# Patient Record
Sex: Female | Born: 1950 | Race: White | Hispanic: No | Marital: Married | Smoking: Former smoker
Health system: Southern US, Community
[De-identification: ages and names within clinical notes are randomized; demographics above are authoritative.]

## PROBLEM LIST (undated history)

## (undated) DIAGNOSIS — R740 Nonspecific elevation of levels of transaminase and lactic acid dehydrogenase [LDH]: Secondary | ICD-10-CM

## (undated) DIAGNOSIS — Z79899 Other long term (current) drug therapy: Secondary | ICD-10-CM

## (undated) DIAGNOSIS — I6523 Occlusion and stenosis of bilateral carotid arteries: Secondary | ICD-10-CM

## (undated) DIAGNOSIS — M069 Rheumatoid arthritis, unspecified: Secondary | ICD-10-CM

## (undated) DIAGNOSIS — E272 Addisonian crisis: Secondary | ICD-10-CM

## (undated) DIAGNOSIS — N183 Chronic kidney disease, stage 3 (moderate): Secondary | ICD-10-CM

## (undated) DIAGNOSIS — R944 Abnormal results of kidney function studies: Secondary | ICD-10-CM

## (undated) DIAGNOSIS — Z9289 Personal history of other medical treatment: Secondary | ICD-10-CM

## (undated) DIAGNOSIS — T7840XA Allergy, unspecified, initial encounter: Secondary | ICD-10-CM

## (undated) DIAGNOSIS — M1990 Unspecified osteoarthritis, unspecified site: Secondary | ICD-10-CM

## (undated) DIAGNOSIS — N179 Acute kidney failure, unspecified: Secondary | ICD-10-CM

## (undated) DIAGNOSIS — E039 Hypothyroidism, unspecified: Secondary | ICD-10-CM

## (undated) DIAGNOSIS — I10 Essential (primary) hypertension: Secondary | ICD-10-CM

## (undated) DIAGNOSIS — H269 Unspecified cataract: Secondary | ICD-10-CM

## (undated) DIAGNOSIS — D649 Anemia, unspecified: Secondary | ICD-10-CM

## (undated) DIAGNOSIS — K8020 Calculus of gallbladder without cholecystitis without obstruction: Secondary | ICD-10-CM

## (undated) HISTORY — DX: Addisonian crisis: E27.2

## (undated) HISTORY — DX: Occlusion and stenosis of bilateral carotid arteries: I65.23

## (undated) HISTORY — DX: Hypothyroidism, unspecified: E03.9

## (undated) HISTORY — DX: Unspecified cataract: H26.9

## (undated) HISTORY — DX: Rheumatoid arthritis, unspecified: M06.9

## (undated) HISTORY — PX: APPENDECTOMY: SHX54

## (undated) HISTORY — DX: Nonspecific elevation of levels of transaminase and lactic acid dehydrogenase (ldh): R74.0

## (undated) HISTORY — DX: Acute kidney failure, unspecified: N17.9

## (undated) HISTORY — DX: Unspecified osteoarthritis, unspecified site: M19.90

## (undated) HISTORY — PX: JOINT REPLACEMENT: SHX530

## (undated) HISTORY — DX: Personal history of other medical treatment: Z92.89

## (undated) HISTORY — DX: Chronic kidney disease, stage 3 (moderate): N18.3

## (undated) HISTORY — DX: Abnormal results of kidney function studies: R94.4

## (undated) HISTORY — DX: Calculus of gallbladder without cholecystitis without obstruction: K80.20

## (undated) HISTORY — DX: Other long term (current) drug therapy: Z79.899

## (undated) HISTORY — DX: Allergy, unspecified, initial encounter: T78.40XA

## (undated) HISTORY — DX: Essential (primary) hypertension: I10

## (undated) HISTORY — DX: Anemia, unspecified: D64.9

---

## 2013-10-04 HISTORY — PX: CATARACT EXTRACTION, BILATERAL: SHX1313

## 2015-04-25 LAB — HM MAMMOGRAPHY

## 2016-02-02 DIAGNOSIS — N179 Acute kidney failure, unspecified: Secondary | ICD-10-CM

## 2016-02-02 HISTORY — PX: TOTAL KNEE ARTHROPLASTY: SHX125

## 2016-02-02 HISTORY — DX: Acute kidney failure, unspecified: N17.9

## 2016-02-12 DIAGNOSIS — E272 Addisonian crisis: Secondary | ICD-10-CM

## 2016-02-12 HISTORY — DX: Addisonian crisis: E27.2

## 2017-01-20 DIAGNOSIS — M5431 Sciatica, right side: Secondary | ICD-10-CM | POA: Diagnosis not present

## 2017-01-20 DIAGNOSIS — M170 Bilateral primary osteoarthritis of knee: Secondary | ICD-10-CM | POA: Diagnosis not present

## 2017-01-20 DIAGNOSIS — M0600 Rheumatoid arthritis without rheumatoid factor, unspecified site: Secondary | ICD-10-CM | POA: Diagnosis not present

## 2017-02-01 DIAGNOSIS — M1990 Unspecified osteoarthritis, unspecified site: Secondary | ICD-10-CM | POA: Diagnosis not present

## 2017-02-21 DIAGNOSIS — Z7952 Long term (current) use of systemic steroids: Secondary | ICD-10-CM | POA: Diagnosis not present

## 2017-02-22 DIAGNOSIS — E785 Hyperlipidemia, unspecified: Secondary | ICD-10-CM | POA: Diagnosis not present

## 2017-02-22 DIAGNOSIS — M810 Age-related osteoporosis without current pathological fracture: Secondary | ICD-10-CM | POA: Diagnosis not present

## 2017-02-22 DIAGNOSIS — Z96659 Presence of unspecified artificial knee joint: Secondary | ICD-10-CM | POA: Diagnosis not present

## 2017-02-22 DIAGNOSIS — G4700 Insomnia, unspecified: Secondary | ICD-10-CM | POA: Diagnosis not present

## 2017-02-22 DIAGNOSIS — M1990 Unspecified osteoarthritis, unspecified site: Secondary | ICD-10-CM | POA: Diagnosis not present

## 2017-02-22 DIAGNOSIS — I10 Essential (primary) hypertension: Secondary | ICD-10-CM | POA: Diagnosis not present

## 2017-02-25 DIAGNOSIS — E039 Hypothyroidism, unspecified: Secondary | ICD-10-CM | POA: Diagnosis not present

## 2017-02-25 DIAGNOSIS — M069 Rheumatoid arthritis, unspecified: Secondary | ICD-10-CM | POA: Diagnosis not present

## 2017-02-25 DIAGNOSIS — Z7952 Long term (current) use of systemic steroids: Secondary | ICD-10-CM | POA: Diagnosis not present

## 2017-03-18 DIAGNOSIS — Z1211 Encounter for screening for malignant neoplasm of colon: Secondary | ICD-10-CM | POA: Diagnosis not present

## 2017-03-18 DIAGNOSIS — K649 Unspecified hemorrhoids: Secondary | ICD-10-CM | POA: Diagnosis not present

## 2017-03-18 DIAGNOSIS — E039 Hypothyroidism, unspecified: Secondary | ICD-10-CM | POA: Diagnosis not present

## 2017-03-18 DIAGNOSIS — I10 Essential (primary) hypertension: Secondary | ICD-10-CM | POA: Diagnosis not present

## 2017-03-18 DIAGNOSIS — K648 Other hemorrhoids: Secondary | ICD-10-CM | POA: Diagnosis not present

## 2017-03-18 DIAGNOSIS — Z86010 Personal history of colonic polyps: Secondary | ICD-10-CM | POA: Diagnosis not present

## 2017-03-18 LAB — HM COLONOSCOPY

## 2017-03-29 DIAGNOSIS — Z7952 Long term (current) use of systemic steroids: Secondary | ICD-10-CM | POA: Diagnosis not present

## 2017-04-01 DIAGNOSIS — M810 Age-related osteoporosis without current pathological fracture: Secondary | ICD-10-CM | POA: Diagnosis not present

## 2017-04-01 DIAGNOSIS — E785 Hyperlipidemia, unspecified: Secondary | ICD-10-CM | POA: Diagnosis not present

## 2017-04-01 DIAGNOSIS — E039 Hypothyroidism, unspecified: Secondary | ICD-10-CM | POA: Diagnosis not present

## 2017-04-01 DIAGNOSIS — E559 Vitamin D deficiency, unspecified: Secondary | ICD-10-CM | POA: Diagnosis not present

## 2017-04-01 DIAGNOSIS — N289 Disorder of kidney and ureter, unspecified: Secondary | ICD-10-CM | POA: Diagnosis not present

## 2017-04-01 DIAGNOSIS — I10 Essential (primary) hypertension: Secondary | ICD-10-CM | POA: Diagnosis not present

## 2017-04-01 DIAGNOSIS — M353 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: Secondary | ICD-10-CM | POA: Diagnosis not present

## 2017-04-13 DIAGNOSIS — M1711 Unilateral primary osteoarthritis, right knee: Secondary | ICD-10-CM | POA: Diagnosis not present

## 2017-04-20 DIAGNOSIS — M175 Other unilateral secondary osteoarthritis of knee: Secondary | ICD-10-CM | POA: Diagnosis not present

## 2017-04-27 DIAGNOSIS — M175 Other unilateral secondary osteoarthritis of knee: Secondary | ICD-10-CM | POA: Diagnosis not present

## 2017-05-26 ENCOUNTER — Ambulatory Visit (INDEPENDENT_AMBULATORY_CARE_PROVIDER_SITE_OTHER): Payer: Medicare Other | Admitting: Physician Assistant

## 2017-05-26 ENCOUNTER — Encounter: Payer: Self-pay | Admitting: Physician Assistant

## 2017-05-26 VITALS — BP 120/74 | HR 72 | Ht 68.0 in | Wt 203.0 lb

## 2017-05-26 DIAGNOSIS — Z23 Encounter for immunization: Secondary | ICD-10-CM | POA: Diagnosis not present

## 2017-05-26 DIAGNOSIS — Z96652 Presence of left artificial knee joint: Secondary | ICD-10-CM | POA: Insufficient documentation

## 2017-05-26 DIAGNOSIS — R740 Nonspecific elevation of levels of transaminase and lactic acid dehydrogenase [LDH]: Secondary | ICD-10-CM

## 2017-05-26 DIAGNOSIS — Z7689 Persons encountering health services in other specified circumstances: Secondary | ICD-10-CM | POA: Diagnosis not present

## 2017-05-26 DIAGNOSIS — M069 Rheumatoid arthritis, unspecified: Secondary | ICD-10-CM | POA: Insufficient documentation

## 2017-05-26 DIAGNOSIS — Z91030 Bee allergy status: Secondary | ICD-10-CM | POA: Diagnosis not present

## 2017-05-26 DIAGNOSIS — I6523 Occlusion and stenosis of bilateral carotid arteries: Secondary | ICD-10-CM | POA: Diagnosis not present

## 2017-05-26 DIAGNOSIS — I10 Essential (primary) hypertension: Secondary | ICD-10-CM | POA: Diagnosis not present

## 2017-05-26 DIAGNOSIS — R0989 Other specified symptoms and signs involving the circulatory and respiratory systems: Secondary | ICD-10-CM | POA: Insufficient documentation

## 2017-05-26 DIAGNOSIS — R944 Abnormal results of kidney function studies: Secondary | ICD-10-CM

## 2017-05-26 DIAGNOSIS — J309 Allergic rhinitis, unspecified: Secondary | ICD-10-CM | POA: Insufficient documentation

## 2017-05-26 DIAGNOSIS — E039 Hypothyroidism, unspecified: Secondary | ICD-10-CM | POA: Diagnosis not present

## 2017-05-26 DIAGNOSIS — R7401 Elevation of levels of liver transaminase levels: Secondary | ICD-10-CM

## 2017-05-26 DIAGNOSIS — R748 Abnormal levels of other serum enzymes: Secondary | ICD-10-CM

## 2017-05-26 DIAGNOSIS — Z8639 Personal history of other endocrine, nutritional and metabolic disease: Secondary | ICD-10-CM | POA: Insufficient documentation

## 2017-05-26 DIAGNOSIS — M1711 Unilateral primary osteoarthritis, right knee: Secondary | ICD-10-CM | POA: Insufficient documentation

## 2017-05-26 DIAGNOSIS — R7989 Other specified abnormal findings of blood chemistry: Secondary | ICD-10-CM

## 2017-05-26 HISTORY — DX: Occlusion and stenosis of bilateral carotid arteries: I65.23

## 2017-05-26 HISTORY — DX: Rheumatoid arthritis, unspecified: M06.9

## 2017-05-26 MED ORDER — EPINEPHRINE 0.3 MG/0.3ML IJ SOAJ: 0.3000 mg | Freq: Once | INTRAMUSCULAR | 1 refills | Status: DC | PRN

## 2017-05-26 MED ORDER — FLUTICASONE PROPIONATE 50 MCG/ACT NA SUSP: 1.0000 | Freq: Every day | NASAL | 1 refills | Status: DC

## 2017-05-26 MED ORDER — CETIRIZINE HCL 10 MG PO TABS: 10.0000 mg | ORAL_TABLET | Freq: Every day | ORAL | 11 refills | Status: DC

## 2017-05-26 NOTE — Progress Notes (Signed)
HPI:                                                                Melody Moore is a 66 y.o. female who presents to [facility] today to establish care  Patient recently relocated from [place], [place].   Hx of adrenal crisis: patient reports during left total knee in May 2017 she was admitted to the ICU for adrenal crisis. Patient had a long-standing history of PMR and was on daily prednisone.   RA: patient reports history of PMR, which was re-assessed by Rheum and diagnosed as RA. She is not sure if she is RF positive. She reports joints affected are "all over," but she is mostly asymptomatic on Methotrexate weekly.  HTN: taking Quinapril and HCTZ daily. Compliant with medications. Checks BP's at home. Denies vision change, headache, chest pain with exertion, orthopnea, lightheadedness, syncope and edema. Risk factors include: obesity, HLD  Hypothyroid: taking Levothyroxine 5 mcg daily. Denies fatigue, dry skin, heart palpitations or weight gain.  Reports she had an annual physical in May 2018. Dexa, Mammogram, and Colonoscopy are UTD per patient.   Past Medical History:  Diagnosis Date  . Adrenal crisis ([place]) 02/12/2016  . AKI (acute kidney injury) ([place]) 02/2016  . Hypertension   . Hypothyroidism    Past Surgical History:  Procedure Laterality Date  . APPENDECTOMY    . CATARACT EXTRACTION, BILATERAL  2015  . TOTAL KNEE ARTHROPLASTY Right 02/2016   Social History  Substance Use Topics  . Smoking status: Former Smoker    Types: Cigarettes    Quit date: 02/07/1974  . Smokeless tobacco: Never Used  . Alcohol use 2.4 oz/week    4 Glasses of wine per week   family history includes Diabetes in her mother; Hypertension in her father; Lung cancer in her father.  ROS: Review of Systems  Constitutional: Negative.   HENT: Negative.   Eyes: Negative.   Respiratory: Negative.   Cardiovascular: Negative.   Gastrointestinal: Negative.    Genitourinary: Negative.   Musculoskeletal: Positive for joint pain.  Skin: Negative.   Neurological: Negative.   Endo/Heme/Allergies: Positive for environmental allergies.  Psychiatric/Behavioral: Negative.     Medications: Current Outpatient Prescriptions  Medication Sig Dispense Refill  . atorvastatin (LIPITOR) 40 MG tablet Take 40 mg by mouth daily.    . cetirizine (ZYRTEC) 10 MG tablet Take 1 tablet (10 mg total) by mouth daily. 30 tablet 11  . Cholecalciferol (VITAMIN D3 PO) Take by mouth.    . fluticasone (FLONASE) 50 MCG/ACT nasal spray Place 1 spray into both nostrils daily. 16 g 1  . gabapentin (NEURONTIN) 300 MG capsule Take 300 mg by mouth 3 (three) times daily.    . hydrochlorothiazide (HYDRODIURIL) 25 MG tablet Take 25 mg by mouth daily.    . hydroxychloroquine (PLAQUENIL) 200 MG tablet Take 200 mg by mouth daily.    Marland Kitchen levothyroxine (SYNTHROID, LEVOTHROID) 50 MCG tablet Take 50 mcg by mouth daily before breakfast.    . quinapril (ACCUPRIL) 40 MG tablet Take 40 mg by mouth at bedtime.    . traMADol (ULTRAM) 50 MG tablet Take by mouth at bedtime.    . traZODone (DESYREL) 100 MG tablet Take 100 mg by mouth at  bedtime.    Marland Kitchen EPINEPHrine (EPIPEN 2-PAK) 0.3 mg/0.3 mL IJ SOAJ injection Inject 0.3 mLs (0.3 mg total) into the muscle once as needed. 2 Device 1   No current facility-administered medications for this visit.    Allergies  Allergen Reactions  . Bee Venom   . Nickel   . Nitrofuran Derivatives   . Oxycodone   . Verapamil        Objective:  BP 120/74   Pulse 72   Ht 5\' 8"  (1.727 m)   Wt 203 lb (92.1 kg)   BMI 30.87 kg/m  Gen:  alert, not ill-appearing, no distress, appropriate for age 11: head normocephalic without obvious abnormality, conjunctiva and cornea clear, trachea midline Pulm: Normal work of breathing, normal phonation, clear to ausculation CV: Normal rate, regular rhythm, s1 and s2 distinct, no murmurs, clicks or rubs, DP and PT pulses  intact Neuro: alert and oriented x 3, no tremor MSK: extremities atraumatic, normal gait and station, no peripheral edema Skin: intact, no rashes on exposed skin, no jaundice, no cyanosis Psych: well-groomed, cooperative, good eye contact, euthymic mood, affect mood-congruent, speech is articulate, and thought processes clear and goal-directed   Depression screen [facility] 2/9 05/26/2017  Decreased Interest 0  Down, Depressed, Hopeless 0  PHQ - 2 Score 0     No results found for this or any previous visit (from the past 72 hour(s)). No results found.    Assessment and Plan: 66 y.o. female with   1. Encounter to establish care - reviewed PMH, PSH, PFH - patient brought outside records from [place], most recent hospitalization in 02/2016. Reviewed records, including imaging reports: MRI brain, CT head, Venous US, ECG, carotid dopplers - Dexa, Mammogram, and Colonoscopy are UTD per patient; requesting outside records - negative PHQ2  2. Acquired hypothyroidism - check TSH every 6 months - cont Synthorid 5 mcg BAH  3. Essential hypertension BP Readings from Last 3 Encounters:  05/26/17 120/74  - BP at goal <130/80 - cont Quinapril and HCTZ - therapeutic lifestyle changes   4. H/O total knee replacement, left  5. History of adrenal insufficiency - checking labs per patient request. She is asymptomatic today - ACTH - Cortisol, free, Serum - COMPLETE METABOLIC PANEL WITH GFR  6. Rheumatoid arthritis involving multiple sites, unspecified rheumatoid factor presence ([place]) - Ambulatory referral to Rheumatology for continued management and Methotrexate refills - Ambulatory referral to Ophthalmology for annual eye exam  7. Osteoarthritis of right knee, unspecified osteoarthritis type - patient reports she has failed Synvisc  - plan to refer to Orthopedics. She would like to check her adrenal function first  8. Need for influenza vaccination - Flu vaccine HIGH DOSE PF  (Fluzone High dose)  9. Allergic rhinitis, unspecified seasonality, unspecified trigger - cetirizine (ZYRTEC) 10 MG tablet; Take 1 tablet (10 mg total) by mouth daily.  Dispense: 30 tablet; Refill: 11 - fluticasone (FLONASE) 50 MCG/ACT nasal spray; Place 1 spray into both nostrils daily.  Dispense: 16 g; Refill: 1 - if no improvement in 4 weeks, adding Singulair  10. Right carotid bruit   11. Carotid stenosis, asymptomatic, bilateral - Dopplers 02/12/2016: right - mid-distal 40-59% stenosis; left - 40-59% stenosis - cont Atorvastatin 40mg  daily  12. Allergy to honey bee venom - EPINEPHrine (EPIPEN 2-PAK) 0.3 mg/0.3 mL IJ SOAJ injection; Inject 0.3 mLs (0.3 mg total) into the muscle once as needed.  Dispense: 2 Device; Refill: 1  Patient education and anticipatory guidance given Patient agrees with  treatment plan Follow-up in 6 months for medication management, HTN, TSH check or sooner as needed if symptoms worsen or fail to improve  Darlyne Russian PA-C

## 2017-05-26 NOTE — Patient Instructions (Signed)
-   Flonase 1 spray each nostril daily. Do not exceed 2 sprays each nostril daily - Cetirizine/Zyrtec 1 tab daily at bedtime - If symptoms persist after 4 weeks, will add Singulair/Montelukast  Allergic Rhinitis Allergic rhinitis is when the mucous membranes in the nose respond to allergens. Allergens are particles in the air that cause your body to have an allergic reaction. This causes you to release allergic antibodies. Through a chain of events, these eventually cause you to release histamine into the blood stream. Although meant to protect the body, it is this release of histamine that causes your discomfort, such as frequent sneezing, congestion, and an itchy, runny nose. What are the causes? Seasonal allergic rhinitis (hay fever) is caused by pollen allergens that may come from grasses, trees, and weeds. Year-round allergic rhinitis (perennial allergic rhinitis) is caused by allergens such as house dust mites, pet dander, and mold spores. What are the signs or symptoms?  Nasal stuffiness (congestion).  Itchy, runny nose with sneezing and tearing of the eyes. How is this diagnosed? Your health care provider can help you determine the allergen or allergens that trigger your symptoms. If you and your health care provider are unable to determine the allergen, skin or blood testing may be used. Your health care provider will diagnose your condition after taking your health history and performing a physical exam. Your health care provider may assess you for other related conditions, such as asthma, pink eye, or an ear infection. How is this treated? Allergic rhinitis does not have a cure, but it can be controlled by:  Medicines that block allergy symptoms. These may include allergy shots, nasal sprays, and oral antihistamines.  Avoiding the allergen.  Hay fever may often be treated with antihistamines in pill or nasal spray forms. Antihistamines block the effects of histamine. There are  over-the-counter medicines that may help with nasal congestion and swelling around the eyes. Check with your health care provider before taking or giving this medicine. If avoiding the allergen or the medicine prescribed do not work, there are many new medicines your health care provider can prescribe. Stronger medicine may be used if initial measures are ineffective. Desensitizing injections can be used if medicine and avoidance does not work. Desensitization is when a patient is given ongoing shots until the body becomes less sensitive to the allergen. Make sure you follow up with your health care provider if problems continue. Follow these instructions at home: It is not possible to completely avoid allergens, but you can reduce your symptoms by taking steps to limit your exposure to them. It helps to know exactly what you are allergic to so that you can avoid your specific triggers. Contact a health care provider if:  You have a fever.  You develop a cough that does not stop easily (persistent).  You have shortness of breath.  You start wheezing.  Symptoms interfere with normal daily activities. This information is not intended to replace advice given to you by your health care provider. Make sure you discuss any questions you have with your health care provider. Document Released: 06/15/2001 Document Revised: 05/21/2016 Document Reviewed: 05/28/2013 Elsevier Interactive Patient Education  2017 [employer].

## 2017-06-02 ENCOUNTER — Encounter: Payer: Self-pay | Admitting: Emergency Medicine

## 2017-06-02 ENCOUNTER — Emergency Department (INDEPENDENT_AMBULATORY_CARE_PROVIDER_SITE_OTHER)
Admission: EM | Admit: 2017-06-02 | Discharge: 2017-06-02 | Disposition: A | Discharge status: Home / Self Care | Payer: Medicare Other | Source: Home / Self Care

## 2017-06-02 DIAGNOSIS — H1013 Acute atopic conjunctivitis, bilateral: Secondary | ICD-10-CM | POA: Diagnosis not present

## 2017-06-02 DIAGNOSIS — J309 Allergic rhinitis, unspecified: Secondary | ICD-10-CM

## 2017-06-02 MED ORDER — OLOPATADINE HCL 0.2 % OP SOLN: 1.0000 [drp] | Freq: Every day | OPHTHALMIC | 0 refills | Status: DC

## 2017-06-02 MED ORDER — BENZONATATE 100 MG PO CAPS: 100.0000 mg | ORAL_CAPSULE | Freq: Three times a day (TID) | ORAL | 0 refills | Status: DC

## 2017-06-02 NOTE — ED Triage Notes (Signed)
Productive cough x 1 week, itchy watery eyes

## 2017-06-02 NOTE — ED Provider Notes (Signed)
[name]    CSN: [id number] Arrival date & time: 06/02/17  1024     History   Chief Complaint Chief Complaint  Patient presents with  . Cough    HPI Melody Moore is a 66 y.o. female.   66 year old female comes in for one-week history of nasal congestion, rhinorrhea, watery eyes, productive cough. She recently moved down from [place], and has since had allergy symptoms. She was seen by her primary care one-week ago for establishment, and was put on Zyrtec and Flonase for allergies. She states she has been using it as directed, but coughing at night is preventing her from sleeping.she is also experiencing watering and itchy eyes. She states she had her windows open yesterday, and Darrick Penna was cutting the grass, and she thinks it exacerbated all her symptoms. Denies fever, chills, night sweats. Denies trouble breathing, swelling of the throat, shortness of breath, wheezing.      Past Medical History:  Diagnosis Date  . Adrenal crisis ([place]) 02/12/2016  . AKI (acute kidney injury) ([place]) 02/2016  . Allergy   . Arthritis   . Cataract   . Hypertension   . Hypothyroidism   . Rheumatoid arthritis [facility])     Patient Active Problem List   Diagnosis Date Noted  . Acquired hypothyroidism 05/26/2017  . Essential hypertension 05/26/2017  . H/O total knee replacement, left 05/26/2017  . History of adrenal insufficiency 05/26/2017  . Rheumatoid arthritis involving multiple sites ([place]) 05/26/2017  . Osteoarthritis of right knee 05/26/2017  . Allergic rhinitis 05/26/2017  . Right carotid bruit 05/26/2017  . Carotid stenosis, asymptomatic, bilateral 05/26/2017  . Allergy to honey bee venom 05/26/2017    Past Surgical History:  Procedure Laterality Date  . APPENDECTOMY    . CATARACT EXTRACTION, BILATERAL  2015  . TOTAL KNEE ARTHROPLASTY Right 02/2016    OB History    No data available       Home Medications    Prior to Admission medications   Medication  Sig Start Date End Date Taking? Authorizing Provider  atorvastatin (LIPITOR) 40 MG tablet Take 40 mg by mouth daily.    [provider]  benzonatate (TESSALON) 100 MG capsule Take 1 capsule (100 mg total) by mouth every 8 (eight) hours. 06/02/17   Tasia Catchings, Amy V, PA-C  Calcium Carbonate-Vit D-Min (CALCIUM 1200 PO) Take by mouth.    [provider]  cetirizine (ZYRTEC) 10 MG tablet Take 1 tablet (10 mg total) by mouth daily. 05/26/17   Trixie Dredge, PA-C  Cholecalciferol (VITAMIN D3 PO) Take by mouth.    [provider]  EPINEPHrine (EPIPEN 2-PAK) 0.3 mg/0.3 mL IJ SOAJ injection Inject 0.3 mLs (0.3 mg total) into the muscle once as needed. 05/26/17   Trixie Dredge, PA-C  fluticasone [facility]) 50 MCG/ACT nasal spray Place 1 spray into both nostrils daily. 05/26/17   Trixie Dredge, PA-C  gabapentin (NEURONTIN) 300 MG capsule Take 300 mg by mouth at bedtime.    [provider]  hydrochlorothiazide (HYDRODIURIL) 25 MG tablet Take 25 mg by mouth daily.    [provider]  hydroxychloroquine (PLAQUENIL) 200 MG tablet Take 200 mg by mouth 2 (two) times daily.     [provider]  leucovorin (WELLCOVORIN) 5 MG tablet Take 5 mg by mouth once a week.    [provider]  levothyroxine (SYNTHROID, LEVOTHROID) 50 MCG tablet Take 50 mcg by mouth daily before breakfast.    [provider]  methotrexate (  RHEUMATREX) 2.5 MG tablet Take 5 mg by mouth once a week.    [provider]  Olopatadine HCl 0.2 % SOLN Apply 1 drop to eye daily. 06/02/17   Tasia Catchings, Amy V, PA-C  quinapril (ACCUPRIL) 40 MG tablet Take 40 mg by mouth daily.     [provider]  traMADol (ULTRAM) 50 MG tablet Take by mouth at bedtime.    [provider]  traZODone (DESYREL) 100 MG tablet Take 100 mg by mouth at bedtime.    [provider]    Family History Family History  Problem Relation Age of Onset  .  Diabetes Mother   . Lung cancer Father   . Hypertension Father     Social History Social History  Substance Use Topics  . Smoking status: Former Smoker    Types: Cigarettes    Quit date: 02/07/1974  . Smokeless tobacco: Never Used  . Alcohol use 2.4 oz/week    4 Glasses of wine per week     Allergies   Bee venom; Nickel; Nitrofuran derivatives; Oxycodone; and Verapamil   Review of Systems Review of Systems  Reason unable to perform ROS: See HPI as above.     Physical Exam Triage Vital Signs ED Triage Vitals  Enc Vitals Group     BP 06/02/17 1043 116/73     Pulse Rate 06/02/17 1043 73     Resp --      Temp 06/02/17 1043 97.6 F (36.4 C)     Temp Source 06/02/17 1043 Oral     SpO2 06/02/17 1043 91 %     Weight 06/02/17 1044 203 lb (92.1 kg)     Height 06/02/17 1044 5\' 8"  (1.727 m)     Head Circumference --      Peak Flow --      Pain Score --      Pain Loc --      Pain Edu? --      Excl. in [place]? --    No data found.   Updated Vital Signs BP 116/73 (BP Location: Left Arm)   Pulse 73   Temp 97.6 F (36.4 C) (Oral)   Ht 5\' 8"  (1.727 m)   Wt 203 lb (92.1 kg)   SpO2 95%   BMI 30.87 kg/m    Physical Exam  Constitutional: She is oriented to person, place, and time. She appears well-developed and well-nourished. No distress.  HENT:  Head: Normocephalic and atraumatic.  Right Ear: Tympanic membrane, external ear and ear canal normal. Tympanic membrane is not erythematous and not bulging.  Left Ear: Tympanic membrane, external ear and ear canal normal. Tympanic membrane is not erythematous and not bulging.  Nose: Mucosal edema and rhinorrhea present. Right sinus exhibits no maxillary sinus tenderness and no frontal sinus tenderness. Left sinus exhibits no maxillary sinus tenderness and no frontal sinus tenderness.  Mouth/Throat: Uvula is midline, oropharynx is clear and moist and mucous membranes are normal.  Eyes: Pupils are equal, round, and reactive to light.  Conjunctivae, EOM and lids are normal. Right eye exhibits no discharge. Left eye exhibits no discharge.  Neck: Normal range of motion. Neck supple.  Cardiovascular: Normal rate, regular rhythm and normal heart sounds.  Exam reveals no gallop and no friction rub.   No murmur heard. Pulmonary/Chest: Effort normal and breath sounds normal. She has no decreased breath sounds. She has no wheezes. She has no rhonchi. She has no rales.  Lymphadenopathy:    She has no  cervical adenopathy.  Neurological: She is alert and oriented to person, place, and time.  Skin: Skin is warm and dry.  Psychiatric: She has a normal mood and affect. Her behavior is normal. Judgment normal.     UC Treatments / Results  Labs (all labs ordered are listed, but only abnormal results are displayed) Labs Reviewed - No data to display  EKG  EKG Interpretation None       Radiology No results found.  Procedures Procedures (including critical care time)  Medications Ordered in UC Medications - No data to display   Initial Impression / Assessment and Plan / UC Course  I have reviewed the triage vital signs and the nursing notes.  Pertinent labs & imaging results that were available during my care of the patient were reviewed by me and considered in my medical decision making (see chart for details).    Discussed with patient history and exam most consistent with allergic rhinitis, allergic conjunctivitis. Given Zyrtec and Flonase to start a one-week ago, to have patient continue. Start nasal saline rinse to help with nasal congestion. Tessalon for cough. Pataday eyedrops for allergic conjunctivitis. Discussed with patient to follow up with PCP if symptoms do not improve for further treatment and evaluation.  Final Clinical Impressions(s) / UC Diagnoses   Final diagnoses:  Allergic rhinitis, unspecified seasonality, unspecified trigger  Allergic conjunctivitis of both eyes    New Prescriptions New  Prescriptions   BENZONATATE (TESSALON) 100 MG CAPSULE    Take 1 capsule (100 mg total) by mouth every 8 (eight) hours.   OLOPATADINE HCL 0.2 % SOLN    Apply 1 drop to eye daily.      Ok Edwards, PA-C 06/02/17 1110

## 2017-06-02 NOTE — Discharge Instructions (Signed)
Start tessalon for cough. Pataday eye drop for allergic conjunctivitis. You can use over the counter nasal saline rinse such as neti pot for nasal congestion. Keep hydrated, your urine should be clear to pale yellow in color.

## 2017-06-14 DIAGNOSIS — Z8639 Personal history of other endocrine, nutritional and metabolic disease: Secondary | ICD-10-CM | POA: Diagnosis not present

## 2017-06-15 ENCOUNTER — Encounter: Payer: Self-pay | Admitting: Physician Assistant

## 2017-06-15 DIAGNOSIS — R944 Abnormal results of kidney function studies: Secondary | ICD-10-CM

## 2017-06-15 DIAGNOSIS — R748 Abnormal levels of other serum enzymes: Secondary | ICD-10-CM | POA: Insufficient documentation

## 2017-06-15 DIAGNOSIS — R7989 Other specified abnormal findings of blood chemistry: Secondary | ICD-10-CM | POA: Insufficient documentation

## 2017-06-15 DIAGNOSIS — R7401 Elevation of levels of liver transaminase levels: Secondary | ICD-10-CM | POA: Insufficient documentation

## 2017-06-15 DIAGNOSIS — R740 Nonspecific elevation of levels of transaminase and lactic acid dehydrogenase [LDH]: Secondary | ICD-10-CM

## 2017-06-15 HISTORY — DX: Abnormal results of kidney function studies: R94.4

## 2017-06-15 HISTORY — DX: Elevation of levels of liver transaminase levels: R74.01

## 2017-06-15 NOTE — Progress Notes (Addendum)
Labs show that kidney function is significantly reduced This is concerning for chronic kidney disease I would like you to reduce your Methotrexate dose to half of what you are currently taking I would also like you to hold your Quinapril and Hydrochlorothiazide Avoid all over-the-counter pain relievers Return on Friday for repeat labs I am also ordering an abdominal ultrasound to assess gallbladder, liver (elevated alk phos and transaminases) and kidneys Make sure you are drinking plenty of water 

## 2017-06-15 NOTE — Addendum Note (Signed)
Addended by: Nelson Chimes E on: 06/15/2017 04:43 PM   Modules accepted: Orders

## 2017-06-15 NOTE — Addendum Note (Signed)
Addended by: Nelson Chimes E on: 06/15/2017 03:03 PM   Modules accepted: Orders

## 2017-06-20 ENCOUNTER — Other Ambulatory Visit: Payer: Self-pay

## 2017-06-20 DIAGNOSIS — R748 Abnormal levels of other serum enzymes: Secondary | ICD-10-CM | POA: Diagnosis not present

## 2017-06-20 DIAGNOSIS — R7989 Other specified abnormal findings of blood chemistry: Secondary | ICD-10-CM

## 2017-06-20 DIAGNOSIS — R944 Abnormal results of kidney function studies: Secondary | ICD-10-CM | POA: Diagnosis not present

## 2017-06-20 DIAGNOSIS — R740 Nonspecific elevation of levels of transaminase and lactic acid dehydrogenase [LDH]: Secondary | ICD-10-CM

## 2017-06-20 DIAGNOSIS — R7401 Elevation of levels of liver transaminase levels: Secondary | ICD-10-CM

## 2017-06-20 LAB — COMPLETE METABOLIC PANEL WITH GFR
AG RATIO: 1.5 (calc) (ref 1.0–2.5)
ALT: 27 U/L (ref 6–29)
AST: 27 U/L (ref 10–35)
Albumin: 3.8 g/dL (ref 3.6–5.1)
Alkaline phosphatase (APISO): 158 U/L — ABNORMAL HIGH (ref 33–130)
BILIRUBIN TOTAL: 0.4 mg/dL (ref 0.2–1.2)
BUN / CREAT RATIO: 11 (calc) (ref 6–22)
BUN: 13 mg/dL (ref 7–25)
CO2: 30 mmol/L (ref 20–32)
Calcium: 9.7 mg/dL (ref 8.6–10.4)
Chloride: 98 mmol/L (ref 98–110)
Creat: 1.19 mg/dL — ABNORMAL HIGH (ref 0.50–0.99)
GFR, EST NON AFRICAN AMERICAN: 47 mL/min/{1.73_m2} — AB (ref >=60)
GFR, Est African American: 54 mL/min/{1.73_m2} — ABNORMAL LOW (ref >=60)
GLUCOSE: 75 mg/dL (ref 65–99)
Globulin: 2.6 g/dL (calc) (ref 1.9–3.7)
Potassium: 3.9 mmol/L (ref 3.5–5.3)
Sodium: 136 mmol/L (ref 135–146)
Total Protein: 6.4 g/dL (ref 6.1–8.1)

## 2017-06-20 NOTE — Progress Notes (Signed)
Kidney function looks better Okay to return to full dose of Methotrexate Okay to re-start blood pressure medications Continue to avoid NSAIDs  Recheck kidney function in 3 months

## 2017-06-21 LAB — COMPLETE METABOLIC PANEL WITH GFR
AG Ratio: 1.3 (calc) (ref 1.0–2.5)
ALKALINE PHOSPHATASE (APISO): 269 U/L — AB (ref 33–130)
ALT: 40 U/L — AB (ref 6–29)
AST: 41 U/L — ABNORMAL HIGH (ref 10–35)
Albumin: 3.5 g/dL — ABNORMAL LOW (ref 3.6–5.1)
BILIRUBIN TOTAL: 0.5 mg/dL (ref 0.2–1.2)
BUN/Creatinine Ratio: 17 (calc) (ref 6–22)
BUN: 34 mg/dL — AB (ref 7–25)
CHLORIDE: 97 mmol/L — AB (ref 98–110)
CO2: 27 mmol/L (ref 20–32)
Calcium: 9.8 mg/dL (ref 8.6–10.4)
Creat: 1.95 mg/dL — ABNORMAL HIGH (ref 0.50–0.99)
GFR, Est African American: 30 mL/min/{1.73_m2} — ABNORMAL LOW (ref >=60)
GFR, Est Non African American: 26 mL/min/{1.73_m2} — ABNORMAL LOW (ref >=60)
GLUCOSE: 89 mg/dL (ref 65–99)
Globulin: 2.8 g/dL (calc) (ref 1.9–3.7)
Potassium: 4.9 mmol/L (ref 3.5–5.3)
Sodium: 135 mmol/L (ref 135–146)
TOTAL PROTEIN: 6.3 g/dL (ref 6.1–8.1)

## 2017-06-21 LAB — CORTISOL, FREE: CORTISOL FREE SERUM: 0.52 ug/dL

## 2017-06-21 LAB — ACTH: C206 ACTH: 46 pg/mL (ref 6–50)

## 2017-06-22 ENCOUNTER — Encounter: Payer: Self-pay | Admitting: Physician Assistant

## 2017-06-22 ENCOUNTER — Ambulatory Visit (INDEPENDENT_AMBULATORY_CARE_PROVIDER_SITE_OTHER): Payer: Medicare HMO

## 2017-06-22 DIAGNOSIS — R740 Nonspecific elevation of levels of transaminase and lactic acid dehydrogenase [LDH]: Secondary | ICD-10-CM

## 2017-06-22 DIAGNOSIS — K8080 Other cholelithiasis without obstruction: Secondary | ICD-10-CM | POA: Diagnosis not present

## 2017-06-22 DIAGNOSIS — K8020 Calculus of gallbladder without cholecystitis without obstruction: Secondary | ICD-10-CM

## 2017-06-22 DIAGNOSIS — R7401 Elevation of levels of liver transaminase levels: Secondary | ICD-10-CM

## 2017-06-22 DIAGNOSIS — Z8639 Personal history of other endocrine, nutritional and metabolic disease: Secondary | ICD-10-CM

## 2017-06-22 DIAGNOSIS — R944 Abnormal results of kidney function studies: Secondary | ICD-10-CM

## 2017-06-22 DIAGNOSIS — R945 Abnormal results of liver function studies: Secondary | ICD-10-CM | POA: Diagnosis not present

## 2017-06-22 DIAGNOSIS — R748 Abnormal levels of other serum enzymes: Secondary | ICD-10-CM

## 2017-06-22 DIAGNOSIS — R7989 Other specified abnormal findings of blood chemistry: Secondary | ICD-10-CM

## 2017-06-22 HISTORY — DX: Calculus of gallbladder without cholecystitis without obstruction: K80.20

## 2017-06-22 NOTE — Progress Notes (Signed)
Ultrasound shows normal appearance of liver There are some stones in the gallbladder, but they are not obstructing anything

## 2017-07-27 DIAGNOSIS — R5383 Other fatigue: Secondary | ICD-10-CM | POA: Diagnosis not present

## 2017-07-27 DIAGNOSIS — E669 Obesity, unspecified: Secondary | ICD-10-CM | POA: Diagnosis not present

## 2017-07-27 DIAGNOSIS — M150 Primary generalized (osteo)arthritis: Secondary | ICD-10-CM | POA: Diagnosis not present

## 2017-07-27 DIAGNOSIS — Z8261 Family history of arthritis: Secondary | ICD-10-CM | POA: Diagnosis not present

## 2017-07-27 DIAGNOSIS — M353 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: Secondary | ICD-10-CM | POA: Diagnosis not present

## 2017-07-27 DIAGNOSIS — Z6830 Body mass index (BMI) 30.0-30.9, adult: Secondary | ICD-10-CM | POA: Diagnosis not present

## 2017-07-27 DIAGNOSIS — M069 Rheumatoid arthritis, unspecified: Secondary | ICD-10-CM | POA: Diagnosis not present

## 2017-08-02 DIAGNOSIS — Z79899 Other long term (current) drug therapy: Secondary | ICD-10-CM | POA: Diagnosis not present

## 2017-08-02 DIAGNOSIS — H524 Presbyopia: Secondary | ICD-10-CM | POA: Diagnosis not present

## 2017-08-02 DIAGNOSIS — M069 Rheumatoid arthritis, unspecified: Secondary | ICD-10-CM | POA: Diagnosis not present

## 2017-08-02 DIAGNOSIS — H52201 Unspecified astigmatism, right eye: Secondary | ICD-10-CM | POA: Diagnosis not present

## 2017-08-02 DIAGNOSIS — Z961 Presence of intraocular lens: Secondary | ICD-10-CM | POA: Diagnosis not present

## 2017-08-27 ENCOUNTER — Other Ambulatory Visit: Payer: Self-pay | Admitting: Physician Assistant

## 2017-09-10 ENCOUNTER — Other Ambulatory Visit: Payer: Self-pay | Admitting: Physician Assistant

## 2017-09-13 ENCOUNTER — Other Ambulatory Visit: Payer: Self-pay | Admitting: Physician Assistant

## 2017-10-17 ENCOUNTER — Other Ambulatory Visit: Payer: Self-pay | Admitting: Physician Assistant

## 2017-10-17 ENCOUNTER — Telehealth: Payer: Self-pay

## 2017-10-17 DIAGNOSIS — M1711 Unilateral primary osteoarthritis, right knee: Secondary | ICD-10-CM

## 2017-10-17 NOTE — Telephone Encounter (Signed)
Done

## 2017-10-17 NOTE — Telephone Encounter (Signed)
Pt is asking for a referral to [facility] for a consult for knee replacement. Please advise. -EH/RMA

## 2017-10-19 NOTE — Telephone Encounter (Signed)
Pt notified -EH/RMA  

## 2017-10-24 DIAGNOSIS — M1711 Unilateral primary osteoarthritis, right knee: Secondary | ICD-10-CM | POA: Diagnosis not present

## 2017-10-24 DIAGNOSIS — M25561 Pain in right knee: Secondary | ICD-10-CM | POA: Diagnosis not present

## 2017-10-27 DIAGNOSIS — Z6831 Body mass index (BMI) 31.0-31.9, adult: Secondary | ICD-10-CM | POA: Diagnosis not present

## 2017-10-27 DIAGNOSIS — M069 Rheumatoid arthritis, unspecified: Secondary | ICD-10-CM | POA: Diagnosis not present

## 2017-10-27 DIAGNOSIS — Z8261 Family history of arthritis: Secondary | ICD-10-CM | POA: Diagnosis not present

## 2017-10-27 DIAGNOSIS — R5383 Other fatigue: Secondary | ICD-10-CM | POA: Diagnosis not present

## 2017-10-27 DIAGNOSIS — E669 Obesity, unspecified: Secondary | ICD-10-CM | POA: Diagnosis not present

## 2017-10-27 DIAGNOSIS — M150 Primary generalized (osteo)arthritis: Secondary | ICD-10-CM | POA: Diagnosis not present

## 2017-10-27 DIAGNOSIS — M353 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: Secondary | ICD-10-CM | POA: Diagnosis not present

## 2017-10-31 ENCOUNTER — Other Ambulatory Visit: Payer: Self-pay

## 2017-10-31 MED ORDER — QUINAPRIL HCL 40 MG PO TABS: 40.0000 mg | ORAL_TABLET | Freq: Every day | ORAL | 0 refills | Status: DC

## 2017-11-02 ENCOUNTER — Encounter: Payer: Self-pay | Admitting: Physician Assistant

## 2017-11-02 ENCOUNTER — Ambulatory Visit (INDEPENDENT_AMBULATORY_CARE_PROVIDER_SITE_OTHER): Payer: Medicare HMO | Admitting: Physician Assistant

## 2017-11-02 VITALS — BP 140/77 | HR 67 | Wt 189.0 lb

## 2017-11-02 DIAGNOSIS — N183 Chronic kidney disease, stage 3 unspecified: Secondary | ICD-10-CM

## 2017-11-02 DIAGNOSIS — Z8639 Personal history of other endocrine, nutritional and metabolic disease: Secondary | ICD-10-CM

## 2017-11-02 DIAGNOSIS — I10 Essential (primary) hypertension: Secondary | ICD-10-CM | POA: Diagnosis not present

## 2017-11-02 DIAGNOSIS — Z01818 Encounter for other preprocedural examination: Secondary | ICD-10-CM | POA: Diagnosis not present

## 2017-11-02 DIAGNOSIS — N1831 Chronic kidney disease, stage 3a: Secondary | ICD-10-CM | POA: Insufficient documentation

## 2017-11-02 DIAGNOSIS — I6523 Occlusion and stenosis of bilateral carotid arteries: Secondary | ICD-10-CM

## 2017-11-02 DIAGNOSIS — D84821 Immunodeficiency due to drugs: Secondary | ICD-10-CM | POA: Insufficient documentation

## 2017-11-02 DIAGNOSIS — Z79899 Other long term (current) drug therapy: Secondary | ICD-10-CM | POA: Diagnosis not present

## 2017-11-02 HISTORY — DX: Chronic kidney disease, stage 3 unspecified: N18.30

## 2017-11-02 HISTORY — DX: Immunodeficiency due to drugs: D84.821

## 2017-11-02 HISTORY — DX: Other long term (current) drug therapy: Z79.899

## 2017-11-02 NOTE — Patient Instructions (Addendum)
-   stop your rheumatoid drugs (Methotrexate and Plaquenil) 1 week prior to surgery - stop Aspirin 1 week before surgery - stop all vitamins/herbal supplements 1 week before surgery - stop Naproxen (Aleve) 3 days before surgery - hold your Hydrochlorothiazide on day of surgery. Continue your Quinapril   For your blood pressure: - Goal <130/80 - monitor and log blood pressures at home - check around the same time each day in a relaxed setting - Limit salt to <2000 mg/day - Follow DASH eating plan - limit alcohol to 2 standard drinks per day for men and 1 per day for women - avoid tobacco products - weight loss: 7% of current body weight   The lab is a walk-in open M-F 7:30a-4:30p (closed 12:30-1:30p). Nothing to eat or drink after midnight or at least 8 hours before your blood draw. You can have water and your medications.

## 2017-11-02 NOTE — Progress Notes (Signed)
Subjective:    Melody Moore is a 67 y.o. female who presents to the office today for a preoperative consultation at the request of orthopedic surgeon, Dr. Berenice Primas, who plans on performing right total knee replacement. This consultation is requested for the specific conditions prompting preoperative evaluation (i.e. 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 Planned anesthesia: general. The patient has the following known anesthesia issues: none. Patients bleeding risk: no remote history of abnormal bleeding. Patient does not have objections to receiving blood products if needed.  The following portions of the patient's history were reviewed and updated as appropriate: allergies, current medications, past family history, past medical history, past social history, past surgical history and problem list.  Review of Systems Chest Pain: no Claudication: no Lower extremity edema: intermittent Dyspnea: no Wheezing: no Chronic Cough: no URI symptoms in the last 2 weeks: no Exercise Tolerance: able to perform 4 METS OSA (loud snoring, gasping, choking): no Personal or Family Hx of bleeding disorders: no Blood thinner use: baby asa History of anemia: yes Personal or Family Hx of anesthesia problems: none Pregnancy risk: postmenopausal  Past Medical History:  Diagnosis Date  . 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 AKI (acute kidney injury) ([place]) 02/2016  . Allergy   . Arthritis   . Carotid stenosis, asymptomatic, bilateral 05/26/2017   Dopplers 02/12/2016: right - mid-distal 40-59% stenosis; left - 40-59% stenosis  . Cataract   . CKD (chronic kidney disease) stage 3, GFR 30-59 ml/min (HCC) 11/02/2017  . Decreased calculated GFR 06/15/2017  . Gallstones 06/22/2017  . Hypertension   . Hypothyroidism   . 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 Rheumatoid arthritis ([place])    . Rheumatoid arthritis involving multiple sites ([place]) 05/26/2017  . Transaminitis 06/15/2017   Normal abdominal US 06/22/17   Past Surgical History:  Procedure Laterality Date  . APPENDECTOMY    . CATARACT EXTRACTION, BILATERAL  2015  . 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   Social History   Tobacco Use  . Smoking status: Former Smoker    Types: Cigarettes    Last attempt to quit: 02/07/1974    Years since quitting: 43.7  . Smokeless tobacco: Never Used  Substance Use Topics  . Alcohol use: Yes    Alcohol/week: 2.4 oz    Types: 4 Glasses of wine per week      Objective:   Vitals:   11/02/17 1619 11/02/17 1635  BP: (!) 167/84 140/77  Pulse: 69 67   Gen:  alert, not ill-appearing, no distress, appropriate for age 44: head normocephalic without obvious abnormality, conjunctiva and cornea clear, trachea midline Pulm: Normal work of breathing, normal phonation, clear to auscultation bilaterally, no wheezes, rales or rhonchi CV: Normal rate, regular rhythm, s1 and s2 distinct, grade II/VI systolic murmur heard best at the RUSB, clicks or rubs; left carotid bruit; radial pulses 2+ symmetric  Neuro: alert and oriented x 3, no tremor MSK: extremities atraumatic, normal gait and station, trace peripheral edema, DP and PT pulses intact Skin: intact, no rashes on exposed skin, no jaundice, no cyanosis   Cardiographics ECG: normal sinus rhythm, no blocks or conduction defects, no ischemic changes Echocardiogram: not done  Imaging Chest x-ray: not indicated   Lab Review  Orders Only on 06/20/2017  Component Date Value  . Glucose, Bld 06/20/2017 75   . BUN 06/20/2017 13   . Creat 06/20/2017 1.19*  . GFR, Est  Non African Ame* 06/20/2017 47*  . GFR, Est African American 06/20/2017 54*  . BUN/Creatinine Ratio 06/20/2017 11   . Sodium 06/20/2017 136   . Potassium 06/20/2017 3.9   . Chloride 06/20/2017 98   . CO2 06/20/2017 30   . Calcium 06/20/2017 9.7   . Total Protein  06/20/2017 6.4   . Albumin 06/20/2017 3.8   . Globulin 06/20/2017 2.6   . AG Ratio 06/20/2017 1.5   . Total Bilirubin 06/20/2017 0.4   . Alkaline phosphatase (AP* 06/20/2017 158*  . AST 06/20/2017 27   . ALT 06/20/2017 27   Office Visit on 05/26/2017  Component Date Value  . Z610 ACTH 06/14/2017 46   . Cortisol Free, Ser 06/14/2017 0.52   . Glucose, Bld 06/14/2017 89   . BUN 06/14/2017 34*  . Creat 06/14/2017 1.95*  . GFR, Est Non African Ame* 06/14/2017 26*  . GFR, Est African American 06/14/2017 30*  . BUN/Creatinine Ratio 06/14/2017 17   . Sodium 06/14/2017 135   . Potassium 06/14/2017 4.9   . Chloride 06/14/2017 97*  . CO2 06/14/2017 27   . Calcium 06/14/2017 9.8   . Total Protein 06/14/2017 6.3   . Albumin 06/14/2017 3.5*  . Globulin 06/14/2017 2.8   . AG Ratio 06/14/2017 1.3   . Total Bilirubin 06/14/2017 0.5   . Alkaline phosphatase (AP* 06/14/2017 269*  . AST 06/14/2017 41*  . ALT 06/14/2017 40*      Assessment:      67 y.o. female with planned surgery as above.   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  Cardiac Risk Estimation: Class I  Current medications which may produce withdrawal symptoms if withheld perioperatively: Quinapril, Tramadol, Gabapentin, Trazodone     Plan:    1. Preoperative workup as follows ECG, hemoglobin, hematocrit, electrolytes, creatinine, glucose, liver function studies, coagulation studies. 2. Change in medication regimen before surgery: hold Methotrexate, Plaquenil, Aspirin, vitamins/herbals 1 week prior to surgery; hold Naproxen 3 days prior; hold thiazide day of. 3. Prophylaxis for cardiac events with perioperative beta-blockers: continue current beta-blocker. 4. Invasive hemodynamic monitoring perioperatively: at the discretion of anesthesiologist. 5. Deep vein  thrombosis prophylaxis postoperatively:regimen to be chosen by surgical team. 6. Surveillance for postoperative MI with ECG immediately postoperatively and on postoperative days 1 and 2 AND troponin levels 24 hours postoperatively and on day 4 or hospital discharge (whichever comes first): at the discretion of anesthesiologist. 7. Other measures: Perioperative "stress-dose" corticosteroids (per anesthesia). Postoperative monitoring of serum creatinine (GFR 47, baseline 1.2) and strict intake and output until per anesthesia/surgical team.    Blood pressure elevated in office today. Surgical clearance: pending labs and hypertension follow-up.  Follow-up in 4 days for nurse BP check.  Darlyne Russian PA-C

## 2017-11-03 ENCOUNTER — Telehealth: Payer: Self-pay

## 2017-11-03 DIAGNOSIS — N183 Chronic kidney disease, stage 3 (moderate): Secondary | ICD-10-CM | POA: Diagnosis not present

## 2017-11-03 DIAGNOSIS — I6523 Occlusion and stenosis of bilateral carotid arteries: Secondary | ICD-10-CM | POA: Diagnosis not present

## 2017-11-03 DIAGNOSIS — Z01818 Encounter for other preprocedural examination: Secondary | ICD-10-CM | POA: Diagnosis not present

## 2017-11-03 NOTE — Telephone Encounter (Signed)
-----   Message from [place], [place] sent at 11/02/2017  8:39 AM EST ----- Regarding: Surgical Clearance Please contact patient regarding her planned knee replacement I would recommend an office visit to discuss surgical clearance from a medical standpoint She also may need pre-op stress dose steroids due to her history of adrenal crisis

## 2017-11-03 NOTE — Telephone Encounter (Signed)
Pt had appointment on 11/02/17 -EH/RMA

## 2017-11-04 LAB — CBC
HEMATOCRIT: 34.9 % — AB (ref 35.0–45.0)
Hemoglobin: 12.1 g/dL (ref 11.7–15.5)
MCH: 32.1 pg (ref 27.0–33.0)
MCHC: 34.7 g/dL (ref 32.0–36.0)
MCV: 92.6 fL (ref 80.0–100.0)
MPV: 11.2 fL (ref 7.5–12.5)
Platelets: 300 10*3/uL (ref 140–400)
RBC: 3.77 10*6/uL — ABNORMAL LOW (ref 3.80–5.10)
RDW: 13.4 % (ref 11.0–15.0)
WBC: 6.4 10*3/uL (ref 3.8–10.8)

## 2017-11-04 LAB — COMPLETE METABOLIC PANEL WITH GFR
AG RATIO: 1.8 (calc) (ref 1.0–2.5)
ALBUMIN MSPROF: 4 g/dL (ref 3.6–5.1)
ALKALINE PHOSPHATASE (APISO): 67 U/L (ref 33–130)
ALT: 23 U/L (ref 6–29)
AST: 24 U/L (ref 10–35)
BILIRUBIN TOTAL: 0.4 mg/dL (ref 0.2–1.2)
BUN/Creatinine Ratio: 18 (calc) (ref 6–22)
BUN: 28 mg/dL — ABNORMAL HIGH (ref 7–25)
CHLORIDE: 101 mmol/L (ref 98–110)
CO2: 30 mmol/L (ref 20–32)
Calcium: 10.1 mg/dL (ref 8.6–10.4)
Creat: 1.55 mg/dL — ABNORMAL HIGH (ref 0.50–0.99)
GFR, EST AFRICAN AMERICAN: 39 mL/min/{1.73_m2} — AB (ref >=60)
GFR, Est Non African American: 34 mL/min/{1.73_m2} — ABNORMAL LOW (ref >=60)
GLOBULIN: 2.2 g/dL (ref 1.9–3.7)
Glucose, Bld: 88 mg/dL (ref 65–99)
Potassium: 4.1 mmol/L (ref 3.5–5.3)
SODIUM: 139 mmol/L (ref 135–146)
TOTAL PROTEIN: 6.2 g/dL (ref 6.1–8.1)

## 2017-11-04 LAB — PROTIME-INR
INR: 1
Prothrombin Time: 10.5 s (ref 9.0–11.5)

## 2017-11-05 ENCOUNTER — Other Ambulatory Visit: Payer: Self-pay | Admitting: Physician Assistant

## 2017-11-07 ENCOUNTER — Telehealth: Payer: Self-pay

## 2017-11-07 ENCOUNTER — Encounter: Payer: Self-pay | Admitting: Physician Assistant

## 2017-11-07 ENCOUNTER — Ambulatory Visit (INDEPENDENT_AMBULATORY_CARE_PROVIDER_SITE_OTHER): Payer: Medicare HMO | Admitting: Physician Assistant

## 2017-11-07 VITALS — BP 134/74 | HR 77 | Temp 98.1°F | Resp 16 | Wt 188.0 lb

## 2017-11-07 DIAGNOSIS — Z9289 Personal history of other medical treatment: Secondary | ICD-10-CM | POA: Insufficient documentation

## 2017-11-07 DIAGNOSIS — I10 Essential (primary) hypertension: Secondary | ICD-10-CM | POA: Diagnosis not present

## 2017-11-07 DIAGNOSIS — Z01818 Encounter for other preprocedural examination: Secondary | ICD-10-CM

## 2017-11-07 HISTORY — DX: Personal history of other medical treatment: Z92.89

## 2017-11-07 NOTE — Progress Notes (Signed)
HPI:                                                                Melody Moore is a 67 y.o. female who presents to [facility]: [facility] today for blood pressure follow-up for preop clearance  Patient presented on 11/02/2017 for preoperative clearance for a right total knee replacement. Her blood pressure was elevated in the office on 2 occasions. She is currently taking Quinapril and HCTZ and is compliant with her medications. She was counseled on therapeutic lifestyle changes and instructed to monitor her BP's at home. Surgical clearance was delayed for hypertension control. Denies blurred vision/vision change, headache, chest pain with exertion, orthopnea, lightheadedness, syncope and edema. Risk factors include: age>55, HLD, carotid stenosis   Home readings below:                   01/31     02/01     02/02     02/03       02/04 8 am:       142/86    127/86   119/77   126/80      120/77 4:30 pm:  126/86    109/68   114/73   126/72     Depression screen PHQ 2/9 05/26/2017 05/26/2017  Decreased Interest 0 0  Down, Depressed, Hopeless 0 0  PHQ - 2 Score 0 0    No flowsheet data found.    Past Medical History:  Diagnosis Date  . 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 AKI (acute kidney injury) ([place]) 02/2016  . Allergy   . Arthritis   . Carotid stenosis, asymptomatic, bilateral 05/26/2017   Dopplers 02/12/2016: right - mid-distal 40-59% stenosis; left - 40-59% stenosis  . Cataract   . CKD (chronic kidney disease) stage 3, GFR 30-59 ml/min (HCC) 11/02/2017  . Decreased calculated GFR 06/15/2017  . Gallstones 06/22/2017  . Hypertension   . Hypothyroidism   . 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 Rheumatoid arthritis ([place])   . Rheumatoid arthritis involving multiple sites ([place]) 05/26/2017  . Transaminitis 06/15/2017   Normal abdominal US 06/22/17   Past Surgical History:  Procedure Laterality Date  . APPENDECTOMY    . CATARACT  EXTRACTION, BILATERAL  2015  . 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   Social History   Tobacco Use  . Smoking status: Former Smoker    Types: Cigarettes    Last attempt to quit: 02/07/1974    Years since quitting: 43.7  . Smokeless tobacco: Never Used  Substance Use Topics  . Alcohol use: Yes    Alcohol/week: 2.4 oz    Types: 4 Glasses of wine per week   family history includes Diabetes in her mother; Hypertension in her father; Lung cancer in her father.    ROS: negative except as noted in the HPI  Medications: Current Outpatient Medications  Medication Sig Dispense Refill  . Aspirin (ASPIR-81 PO) Take by mouth.    Marland Kitchen atorvastatin (LIPITOR) 40 MG tablet Take 40 mg by mouth daily.    . Calcium Carbonate-Vit D-Min (CALCIUM 1200 PO) Take by mouth.    . cetirizine (ZYRTEC) 10 MG tablet Take 1 tablet (10 mg total) by mouth daily. 30 tablet 11  .  Cholecalciferol (VITAMIN D3 PO) Take by mouth.    . docusate sodium (COLACE) 100 MG capsule Take 100 mg by mouth 2 (two) times daily.    Marland Kitchen EPINEPHrine (EPIPEN 2-PAK) 0.3 mg/0.3 mL IJ SOAJ injection Inject 0.3 mLs (0.3 mg total) into the muscle once as needed. 2 Device 1  . fluticasone (FLONASE) 50 MCG/ACT nasal spray Place 1 spray into both nostrils daily. 16 g 1  . gabapentin (NEURONTIN) 300 MG capsule TAKE 1 CAPSULE BY MOUTH EVERYDAY AT BEDTIME 90 capsule 0  . hydrochlorothiazide (HYDRODIURIL) 25 MG tablet Take 25 mg by mouth daily.    . hydroxychloroquine (PLAQUENIL) 200 MG tablet Take 200 mg by mouth 2 (two) times daily.     . Iron-Vitamin C (IRON 100/C PO) Take by mouth.    Marland Kitchen leucovorin (WELLCOVORIN) 5 MG tablet Take 5 mg by mouth once a week.    . levothyroxine (SYNTHROID, LEVOTHROID) 50 MCG tablet TAKE 1 TABLET BY MOUTH EVERY DAY IN THE MORNING 90 tablet 0  . methotrexate (RHEUMATREX) 2.5 MG tablet Take 5 mg by mouth once a week.    . Olopatadine HCl 0.2 % SOLN Apply 1 drop to eye daily. 1 Bottle 0  . quinapril (ACCUPRIL) 40 MG tablet  Take 1 tablet (40 mg total) by mouth daily. 90 tablet 0  . traMADol (ULTRAM) 50 MG tablet TAKE 1 TABLET BY MOUTH AT BEDTIME AS NEEDED 30 tablet 1  . traZODone (DESYREL) 100 MG tablet Take 100 mg by mouth at bedtime.     No current facility-administered medications for this visit.    Allergies  Allergen Reactions  . Bee Venom Anaphylaxis  . Oxycodone Other (See Comments)    Adrenal crisis  . Verapamil Hives  . Nickel Rash  . Nitrofuran Derivatives Rash       Objective:  BP 134/74 (BP Location: Left Arm, Patient Position: Sitting, Cuff Size: Large)   Pulse 77   Temp 98.1 F (36.7 C) (Oral)   Resp 16   Wt 188 lb (85.3 kg)   SpO2 100%   BMI 28.59 kg/m      No results found for this or any previous visit (from the past 72 hour(s)). No results found.  Lab Results  Component Value Date   WBC 6.4 11/03/2017   HGB 12.1 11/03/2017   HCT 34.9 (L) 11/03/2017   MCV 92.6 11/03/2017   PLT 300 11/03/2017   Lab Results  Component Value Date   CREATININE 1.55 (H) 11/03/2017   BUN 28 (H) 11/03/2017   NA 139 11/03/2017   K 4.1 11/03/2017   CL 101 11/03/2017   CO2 30 11/03/2017   Lab Results  Component Value Date   INR 1.0 11/03/2017   No results found for: PTT   Assessment and Plan: 67 y.o. female with   1. Hypertension goal BP (blood pressure) < 140/80 BP Readings from Last 3 Encounters:  11/07/17 134/74  11/02/17 140/77  06/02/17 116/73  - BP in range in the office and at home - continue daily medications - hold HCTZ day of surgery, continue Quinapril day of surgery   2. Preoperative clearance - Medically cleared - Class I cardiovascular risk (low) - Per Rheumatology, hold Methotrexate 2 weeks pre-op and 2 weeks post-op - hold aspirin, vitamins/herbals 1 week pre-op - hold Naproxen 3 days pre-op - Perioperative "stress-dose" corticosteroids (per anesthesia). - Postoperative monitoring of serum creatinine (GFR 34-47, baseline SCr 1.2-1.55) and strict  intake and output until per anesthesia/surgical team.  Darlyne Russian PA-C

## 2017-11-07 NOTE — Telephone Encounter (Signed)
Left message with Ivin Booty to have Junie Panning return call to [place]

## 2017-11-07 NOTE — Progress Notes (Signed)
She is cleared for surgery Per Rheumatologist, she should stop her Methotrexate 2 weeks before surgery and re-start it 2 weeks after surgery Per me, she should stop her baby aspirin and all herbals/vitamins 1 week before surgery; stop her Hydrochlorothiazide the day of surgery; okay to take her other daily medications unless directed otherwise by anesthesia  Her kidney function is reduced compared to 4 months ago (CKD stage 3B) I would like her to avoid Naproxen and NSAIDs if possible and use Tylenol arthritis for pain (1300 mg every 8 hours as needed) Hydrate - at least 8 glasses of water per day After her knee replacement, we will discuss some medication adjustments. Schedule a follow-up in 1 month

## 2017-11-07 NOTE — Telephone Encounter (Signed)
-----   Message from [facility], [place] sent at 11/07/2017  8:21 AM EST ----- Regarding: FW: Surgical Clearance Ev, Can you contact [facility] and let them know that I am trying to get in touch with Marella Chimes about a mutual patient for surgical clearance? You can leave my cell [phone] Thanks! ----- Message ----- From: Trixie Dredge, PA-C Sent: 11/04/2017  11:57 AM To: Rosita Kea, PA-C, # Subject: Surgical Clearance                             Hi Erin!  I am working on pre-op eval for Marjie for a planned total knee replacement.  I was wondering if you could weigh in on any rheumatologic recommendations. Specifically 1. Whether to hold or continue her DMARDs 2. Stress dose steroids peri-operatively  Thanks! Evlyn Clines

## 2017-11-07 NOTE — Progress Notes (Signed)
HPI: Patient is here for blood pressure check. Denies any chest pain, shortness of breath, palpitations or medication problems.   Patient brought in the following readings:                     01/31     02/01     02/02     02/03       02/04 8 am:       142/86    127/86   119/77   126/80      120/77 4:30 pm:  126/86    109/68   114/73   126/72    Vitals:   11/07/17 1307  BP: 134/74  Pulse: 77  Resp: 16  Temp: 98.1 F (36.7 C)  SpO2: 100%

## 2017-11-17 ENCOUNTER — Other Ambulatory Visit: Payer: Self-pay

## 2017-11-17 ENCOUNTER — Other Ambulatory Visit: Payer: Self-pay | Admitting: Orthopedic Surgery

## 2017-11-17 MED ORDER — TRAMADOL HCL 50 MG PO TABS: 50.0000 mg | ORAL_TABLET | Freq: Every evening | ORAL | 1 refills | Status: DC | PRN

## 2017-11-25 ENCOUNTER — Encounter: Payer: Self-pay | Admitting: Physician Assistant

## 2017-11-25 ENCOUNTER — Ambulatory Visit (INDEPENDENT_AMBULATORY_CARE_PROVIDER_SITE_OTHER): Payer: Medicare HMO | Admitting: Physician Assistant

## 2017-11-25 VITALS — BP 116/66 | HR 79 | Wt 192.0 lb

## 2017-11-25 DIAGNOSIS — M8949 Other hypertrophic osteoarthropathy, multiple sites: Secondary | ICD-10-CM

## 2017-11-25 DIAGNOSIS — M159 Polyosteoarthritis, unspecified: Secondary | ICD-10-CM

## 2017-11-25 DIAGNOSIS — I129 Hypertensive chronic kidney disease with stage 1 through stage 4 chronic kidney disease, or unspecified chronic kidney disease: Secondary | ICD-10-CM

## 2017-11-25 DIAGNOSIS — M150 Primary generalized (osteo)arthritis: Secondary | ICD-10-CM | POA: Diagnosis not present

## 2017-11-25 DIAGNOSIS — N183 Chronic kidney disease, stage 3 unspecified: Secondary | ICD-10-CM

## 2017-11-25 MED ORDER — AMLODIPINE BESYLATE 5 MG PO TABS: 5.0000 mg | ORAL_TABLET | Freq: Every day | ORAL | 5 refills | Status: DC

## 2017-11-25 MED ORDER — ACETAMINOPHEN ER 650 MG PO TBCR: 1300.0000 mg | EXTENDED_RELEASE_TABLET | Freq: Three times a day (TID) | ORAL | 3 refills | Status: DC | PRN

## 2017-11-25 NOTE — Progress Notes (Signed)
HPI:                                                                Melody Moore is a 67 y.o. female who presents to [place]: [place] today for hypertension and CKD follow-up  Right TKR planned for March 15th at [facility]. Patient has obtained surgical clearance through me. On her pre-op labs, her CKD has worsened.  CKD: taking Quinapril 40mg  daily. No anemia on CBC. Denies dyspnea and fatigue. Denies urinary symptoms. Lab Results  Component Value Date   CREATININE 1.55 (H) 11/03/2017   Lab Results  Component Value Date   HGB 12.1 11/03/2017     HTN: taking Quinapril 40mg  and HCTZ 25mg  daily. Compliant with medications. BP in range at home. Denies vision change, headache, chest pain with exertion, orthopnea, lightheadedness, syncope and edema. Risk factors include:   Depression screen [facility] 2/9 11/25/2017 05/26/2017 05/26/2017  Decreased Interest 2 0 0  Down, Depressed, Hopeless 1 0 0  PHQ - 2 Score 3 0 0  Altered sleeping 0 - -  Tired, decreased energy 2 - -  Change in appetite 0 - -  Feeling bad or failure about yourself  0 - -  Trouble concentrating 0 - -  Moving slowly or fidgety/restless 0 - -  Suicidal thoughts 0 - -  PHQ-9 Score 5 - -  Difficult doing work/chores Somewhat difficult - -    No flowsheet data found.    Past Medical History:  Diagnosis Date  . 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 AKI (acute kidney injury) ([place]) 02/2016  . Allergy   . Arthritis   . Carotid stenosis, asymptomatic, bilateral 05/26/2017   Dopplers 02/12/2016: right - mid-distal 40-59% stenosis; left - 40-59% stenosis  . Cataract   . CKD (chronic kidney disease) stage 3, GFR 30-59 ml/min (HCC) 11/02/2017  . Decreased calculated GFR 06/15/2017  . Gallstones 06/22/2017  . History of echocardiogram 11/07/2017   04/09/2016: EF 78%, mild diastolic dysfunction, trivial MR and TR  . Hypertension   . Hypothyroidism   . Immunosuppression due to drug  therapy 11/02/2017  . Rheumatoid arthritis ([place])   . Rheumatoid arthritis involving multiple sites ([place]) 05/26/2017  . Transaminitis 06/15/2017   Normal abdominal US 06/22/17   Past Surgical History:  Procedure Laterality Date  . APPENDECTOMY    . CATARACT EXTRACTION, BILATERAL  2015  . 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   Social History   Tobacco Use  . Smoking status: Former Smoker    Types: Cigarettes    Last attempt to quit: 02/07/1974    Years since quitting: 43.8  . Smokeless tobacco: Never Used  Substance Use Topics  . Alcohol use: Yes    Alcohol/week: 2.4 oz    Types: 4 Glasses of wine per week   family history includes Diabetes in her mother; Hypertension in her father; Lung cancer in her father.    ROS: negative except as noted in the HPI  Medications: Current Outpatient Medications  Medication Sig Dispense Refill  . Aspirin (ASPIR-81 PO) Take by mouth.    Marland Kitchen atorvastatin (LIPITOR) 40 MG tablet Take 40 mg by mouth daily.    . Calcium Carbonate-Vit D-Min (CALCIUM 1200 PO) Take by mouth.    Marland Kitchen  cetirizine (ZYRTEC) 10 MG tablet Take 1 tablet (10 mg total) by mouth daily. 30 tablet 11  . Cholecalciferol (VITAMIN D3 PO) Take by mouth.    . docusate sodium (COLACE) 100 MG capsule Take 100 mg by mouth 2 (two) times daily.    Marland Kitchen EPINEPHrine (EPIPEN 2-PAK) 0.3 mg/0.3 mL IJ SOAJ injection Inject 0.3 mLs (0.3 mg total) into the muscle once as needed. 2 Device 1  . fluticasone (FLONASE) 50 MCG/ACT nasal spray Place 1 spray into both nostrils daily. 16 g 1  . gabapentin (NEURONTIN) 300 MG capsule TAKE 1 CAPSULE BY MOUTH EVERYDAY AT BEDTIME 90 capsule 0  . hydroxychloroquine (PLAQUENIL) 200 MG tablet Take 200 mg by mouth 2 (two) times daily.     . Iron-Vitamin C (IRON 100/C PO) Take by mouth.    Marland Kitchen leucovorin (WELLCOVORIN) 5 MG tablet Take 5 mg by mouth once a week.    . levothyroxine (SYNTHROID, LEVOTHROID) 50 MCG tablet TAKE 1 TABLET BY MOUTH EVERY DAY IN THE MORNING 90 tablet 0   . methotrexate (RHEUMATREX) 2.5 MG tablet Take 5 mg by mouth once a week.    . Olopatadine HCl 0.2 % SOLN Apply 1 drop to eye daily. 1 Bottle 0  . quinapril (ACCUPRIL) 40 MG tablet Take 1 tablet (40 mg total) by mouth daily. 90 tablet 0  . traMADol (ULTRAM) 50 MG tablet Take 1 tablet (50 mg total) by mouth at bedtime as needed. 30 tablet 1  . traZODone (DESYREL) 100 MG tablet Take 100 mg by mouth at bedtime.    Marland Kitchen acetaminophen (TYLENOL) 650 MG CR tablet Take 2 tablets (1,300 mg total) by mouth every 8 (eight) hours as needed for pain. 90 tablet 3  . amLODipine (NORVASC) 5 MG tablet Take 1 tablet (5 mg total) by mouth daily. 30 tablet 5   No current facility-administered medications for this visit.    Allergies  Allergen Reactions  . Bee Venom Anaphylaxis  . Oxycodone Other (See Comments)    Adrenal crisis  . Verapamil Hives  . Nickel Rash  . Nitrofuran Derivatives Rash       Objective:  BP 116/66   Pulse 79   Wt 192 lb (87.1 kg)   BMI 29.19 kg/m  Gen:  alert, not ill-appearing, no distress, appropriate for age 55: head normocephalic without obvious abnormality, conjunctiva and cornea clear, trachea midline Pulm: Normal work of breathing, normal phonation, clear to auscultation bilaterally, no wheezes, rales or rhonchi CV: Normal rate, regular rhythm, s1 and s2 distinct, no murmurs, clicks or rubs  Neuro: alert and oriented x 3, no tremor MSK: extremities atraumatic, normal gait and station Skin: intact, no rashes on exposed skin, no jaundice, no cyanosis Psych: well-groomed, cooperative, good eye contact, euthymic mood, affect mood-congruent, speech is articulate, and thought processes clear and goal-directed    No results found for this or any previous visit (from the past 72 hour(s)). No results found.    Assessment and Plan: 67 y.o. female with   1. Primary osteoarthritis involving multiple joints - counseled on limiting use of Aleve due to CKD.  -  acetaminophen (TYLENOL) 650 MG CR tablet; Take 2 tablets (1,300 mg total) by mouth every 8 (eight) hours as needed for pain. (Patient taking differently: Take 1,300 mg by mouth 2 (two) times daily as needed for pain. )  Dispense: 90 tablet; Refill: 3  2. Benign hypertension with CKD (chronic kidney disease) stage III (HCC) BP Readings from Last 3 Encounters:  11/25/17 116/66  11/07/17 134/74  11/02/17 140/77  - stopping HCTZ due to decline in renal function. Continue Quinapril. She is allergic to Verapamil (rash). Will switch to [place] calcium channel blocker. - counseled on therapeutic lifestyle changes - continue to monitor BP at home - amLODipine (NORVASC) 5 MG tablet; Take 1 tablet (5 mg total) by mouth daily.  Dispense: 30 tablet; Refill: 5   Patient education and anticipatory guidance given Patient agrees with treatment plan Follow-up in 3 months or sooner as needed if symptoms worsen or fail to improve  Darlyne Russian PA-C

## 2017-11-25 NOTE — Patient Instructions (Addendum)
For your blood pressure: - Goal <130/80 - baby aspirin 81 mg daily to help prevent heart attack/stroke. Stop baby aspirin 1 week before surgery - continue your Quinapril 40 mg daily - STOP Hydrochlorothiazide - start Amlodipine 5 mg daily. Take with your Quinapril. - monitor and log blood pressures at home - check around the same time each day in a relaxed setting - Limit salt to <2000 mg/day - Follow DASH eating plan - limit alcohol to 2 standard drinks per day for men and 1 per day for women - avoid tobacco products - weight loss: 7% of current body weight    Food Basics for Chronic Kidney Disease When your kidneys are not working well, they cannot remove waste and excess substances from your blood as effectively as they did before. This can lead to a buildup and imbalance of these substances, which can worsen kidney damage and affect how your body functions. Certain foods lead to a buildup of these substances in the body. By changing your diet as recommended by your diet and nutrition specialist (dietitian) or health care provider, you could help prevent further kidney damage and delay or prevent the need for dialysis. What are tips for following this plan? General instructions  Work with your health care provider and dietitian to develop a meal plan that is right for you. Foods you can eat, limit, or avoid will be different for each person depending on the stage of kidney disease and any other existing health conditions.  Talk with your health care provider about whether you should take a vitamin and mineral supplement.  Use standard measuring cups and spoons to measure servings of foods. Use a kitchen scale to measure portions of protein foods.  If directed by your health care provider, avoid drinking too much fluid. Measure and count all liquids, including water, ice, soups, flavored gelatin, and frozen desserts such as popsicles or ice cream. Reading food labels  Check the amount  of sodium in foods. Choose foods that have less than 300 milligrams (mg) per serving.  Check the ingredient list for phosphorus or potassium-based additives or preservatives.  Check the amount of saturated and trans fat. Limit or avoid these fats as told by your dietitian. Shopping  Avoid buying foods that are: ? Processed, frozen, or prepackaged. ? Calcium-enriched or fortified.  Do not buy foods that have salt or sodium listed among the first five ingredients.  Do not buy canned vegetables. Cooking  Replace animal proteins, such as meat, fish, eggs, or dairy, with plant proteins from beans, nuts, and soy. ? Use soy milk instead of cow's milk. ? Add beans or tofu to soups, casseroles, or pasta dishes instead of meat.  Soak vegetables, such as potatoes, before cooking to reduce potassium. To do this: ? Peel and cut into small pieces. ? Soak in warm water for at least 2 hours. For every 1 cup of vegetables, use 10 cups of water. ? Drain and rinse with warm water. ? Boil for at least 5 minutes. Meal planning  Limit the amount of protein from plant and animal sources you eat each day.  Do not add salt to food when cooking or before eating.  Eat meals and snacks at around the same time each day. If you have diabetes:  If you have diabetes (diabetes mellitus) and chronic kidney disease, it is important to keep your blood glucose in the target range recommended by your health care provider. Follow your diabetes management plan. This may  include: ? Checking your blood glucose regularly. ? Taking oral medicines, insulin, or both. ? Exercising for at least 30 minutes on 5 or more days each week, or as told by your health care provider. ? Tracking how many servings of carbohydrates you eat at each meal.  You may be given specific guidelines on how much of certain foods and nutrients you may eat, depending on your stage of kidney disease and whether you have high blood pressure  (hypertension). Follow your meal plan as told by your dietitian. What nutrients should be limited? The items listed are not a complete list. Talk with your dietitian about what dietary choices are best for you. Potassium Potassium affects how steadily your heart beats. If too much potassium builds up in your blood, it can cause an irregular heartbeat or even a heart attack. You may need to eat less potassium, depending on your blood potassium levels and the stage of kidney disease. Talk to your dietitian about how much potassium you may have each day. You may need to limit or avoid foods that are high in potassium, such as:  Milk and soy milk.  Fruits, such as bananas, papaya, apricots, nectarines, melon, prunes, raisins, kiwi, and oranges.  Vegetables, such as potatoes, sweet potatoes, yams, tomatoes, leafy greens, beets, okra, avocado, pumpkin, and winter squash.  White and lima beans.  Phosphorus Phosphorus is a mineral found in your bones. A balance between calcium and phosphorous is needed to build and maintain healthy bones. Too much phosphorus pulls calcium from your bones. This can make your bones weak and more likely to break. Too much phosphorus can also make your skin itch. You may need to eat less phosphorus depending on your blood phosphorus levels and the stage of kidney disease. Talk to your dietitian about how much potassium you may have each day. You may need to take medicine to lower your blood phosphorus levels if diet changes do not help. You may need to limit or avoid foods that are high in phosphorus, such as:  Milk and dairy products.  Dried beans and peas.  Tofu, soy milk, and other soy-based meat replacements.  Colas.  Nuts and peanut butter.  Meat, poultry, and fish.  Bran cereals and oatmeals.  Protein Protein helps you to make and keep muscle. It also helps in the repair of your body's cells and tissues. One of the natural breakdown products of protein  is a waste product called urea. When your kidneys are not working properly, they cannot remove wastes, such as urea, like they did before you developed chronic kidney disease. Reducing how much protein you eat can help prevent a buildup of urea in your blood. Depending on your stage of kidney disease, you may need to limit foods that are high in protein. Sources of animal protein include:  Meat (all types).  Fish and seafood.  Poultry.  Eggs.  Dairy.  Other protein foods include:  Beans and legumes.  Nuts and nut butter.  Soy and tofu.  Sodium Sodium, which is found in salt, helps maintain a healthy balance of fluids in your body. Too much sodium can increase your blood pressure and have a negative effect on the function of your heart and lungs. Too much sodium can also cause your body to retain too much fluid, making your kidneys work harder. Most people should have less than 2,300 milligrams (mg) of sodium each day. If you have hypertension, you may need to limit your sodium to 1,500  mg each day. Talk to your dietitian about how much sodium you may have each day. You may need to limit or avoid foods that are high in sodium, such as:  Salt seasonings.  Soy sauce.  Cured and processed meats.  Salted crackers and snack foods.  Fast food.  Canned soups and most canned foods.  Pickled foods.  Vegetable juice.  Boxed mixes or ready-to-eat boxed meals and side dishes.  Bottled dressings, sauces, and marinades.  Summary  Chronic kidney disease can lead to a buildup and imbalance of waste and excess substances in the body. Certain foods lead to a buildup of these substances. By adjusting your intake of these foods, you could help prevent more kidney damage and delay or prevent the need for dialysis.  Food adjustments are different for each person with chronic kidney disease. Work with a dietitian to set up nutrient goals and a meal plan that is right for you.  If you have  diabetes and chronic kidney disease, it is important to keep your blood glucose in the target range recommended by your health care provider. This information is not intended to replace advice given to you by your health care provider. Make sure you discuss any questions you have with your health care provider. Document Released: 12/11/2002 Document Revised: 09/15/2016 Document Reviewed: 09/15/2016 Elsevier Interactive Patient Education  [employer].

## 2017-11-29 ENCOUNTER — Other Ambulatory Visit: Payer: Self-pay | Admitting: Orthopedic Surgery

## 2017-12-04 ENCOUNTER — Encounter: Payer: Self-pay | Admitting: Physician Assistant

## 2017-12-04 DIAGNOSIS — M8949 Other hypertrophic osteoarthropathy, multiple sites: Secondary | ICD-10-CM | POA: Insufficient documentation

## 2017-12-04 DIAGNOSIS — N183 Chronic kidney disease, stage 3 unspecified: Secondary | ICD-10-CM | POA: Insufficient documentation

## 2017-12-04 DIAGNOSIS — M159 Polyosteoarthritis, unspecified: Secondary | ICD-10-CM | POA: Insufficient documentation

## 2017-12-04 DIAGNOSIS — M150 Primary generalized (osteo)arthritis: Principal | ICD-10-CM

## 2017-12-04 DIAGNOSIS — I129 Hypertensive chronic kidney disease with stage 1 through stage 4 chronic kidney disease, or unspecified chronic kidney disease: Secondary | ICD-10-CM | POA: Insufficient documentation

## 2017-12-08 NOTE — Progress Notes (Signed)
  11-25-17 (Epic) Surgical Clearance   11-02-17 (Epic) EKG

## 2017-12-08 NOTE — Patient Instructions (Signed)
Melody Moore  12/08/2017   Your procedure is scheduled on: 12-16-17   Report to [facility] Main  Entrance  ARRIVE AT 530 AM. Have a seat in the Main Lobby. Please note there is a phone at the [employer]. Please call [phone] on that phone. Someone from Short Stay will come and get you from the Main Lobby and take you to Short Stay.    Call this number if you have problems the morning of surgery [phone]   Remember: Do not eat food or drink liquids :After Midnight.     Take these medicines the morning of surgery with A SIP OF WATER: Amlodipine (Norvasc), and Levothyroxine (Synthroid)                                You may not have any metal on your body including hair pins and              piercings  Do not wear jewelry, make-up, lotions, powders or perfumes, deodorant             Do not wear nail polish.  Do not shave  48 hours prior to surgery.                Do not bring valuables to the hospital. [place].  Contacts, dentures or bridgework may not be worn into surgery.  Leave suitcase in the car. After surgery it may be brought to your room.                  Please read over the following fact sheets you were given: _____________________________________________________________________             [facility] - Preparing for Surgery Before surgery, you can play an important role.  Because skin is not sterile, your skin needs to be as free of germs as possible.  You can reduce the number of germs on your skin by washing with CHG (chlorahexidine gluconate) soap before surgery.  CHG is an antiseptic cleaner which kills germs and bonds with the skin to continue killing germs even after washing. Please DO NOT use if you have an allergy to CHG or antibacterial soaps.  If your skin becomes reddened/irritated stop using the CHG and inform your nurse when you arrive at Short Stay. Do not  shave (including legs and underarms) for at least 48 hours prior to the first CHG shower.  You may shave your face/neck. Please follow these instructions carefully:  1.  Shower with CHG Soap the night before surgery and the  morning of Surgery.  2.  If you choose to wash your hair, wash your hair first as usual with your  normal  shampoo.  3.  After you shampoo, rinse your hair and body thoroughly to remove the  shampoo.                           4.  Use CHG as you would any other liquid soap.  You can apply chg directly  to the skin and wash  Gently with a scrungie or clean washcloth.  5.  Apply the CHG Soap to your body ONLY FROM THE NECK DOWN.   Do not use on face/ open                           Wound or open sores. Avoid contact with eyes, ears mouth and genitals (private parts).                       Wash face,  Genitals (private parts) with your normal soap.             6.  Wash thoroughly, paying special attention to the area where your surgery  will be performed.  7.  Thoroughly rinse your body with warm water from the neck down.  8.  DO NOT shower/wash with your normal soap after using and rinsing off  the CHG Soap.                9.  Pat yourself dry with a clean towel.            10.  Wear clean pajamas.            11.  Place clean sheets on your bed the night of your first shower and do not  sleep with pets. Day of Surgery : Do not apply any lotions/deodorants the morning of surgery.  Please wear clean clothes to the hospital/surgery center.  FAILURE TO FOLLOW THESE INSTRUCTIONS MAY RESULT IN THE CANCELLATION OF YOUR SURGERY PATIENT SIGNATURE_________________________________  NURSE SIGNATURE__________________________________  ________________________________________________________________________   Adam Phenix  An incentive spirometer is a tool that can help keep your lungs clear and active. This tool measures how well you are filling your lungs  with each breath. Taking long deep breaths may help reverse or decrease the chance of developing breathing (pulmonary) problems (especially infection) following:  A long period of time when you are unable to move or be active. BEFORE THE PROCEDURE   If the spirometer includes an indicator to show your best effort, your nurse or respiratory therapist will set it to a desired goal.  If possible, sit up straight or lean slightly forward. Try not to slouch.  Hold the incentive spirometer in an upright position. INSTRUCTIONS FOR USE  1. Sit on the edge of your bed if possible, or sit up as far as you can in bed or on a chair. 2. Hold the incentive spirometer in an upright position. 3. Breathe out normally. 4. Place the mouthpiece in your mouth and seal your lips tightly around it. 5. Breathe in slowly and as deeply as possible, raising the piston or the ball toward the top of the column. 6. Hold your breath for 3-5 seconds or for as long as possible. Allow the piston or ball to fall to the bottom of the column. 7. Remove the mouthpiece from your mouth and breathe out normally. 8. Rest for a few seconds and repeat Steps 1 through 7 at least 10 times every 1-2 hours when you are awake. Take your time and take a few normal breaths between deep breaths. 9. The spirometer may include an indicator to show your best effort. Use the indicator as a goal to work toward during each repetition. 10. After each set of 10 deep breaths, practice coughing to be sure your lungs are clear. If you have an incision (the cut made at the time of surgery),  support your incision when coughing by placing a pillow or rolled up towels firmly against it. Once you are able to get out of bed, walk around indoors and cough well. You may stop using the incentive spirometer when instructed by your caregiver.  RISKS AND COMPLICATIONS  Take your time so you do not get dizzy or light-headed.  If you are in pain, you may need to  take or ask for pain medication before doing incentive spirometry. It is harder to take a deep breath if you are having pain. AFTER USE  Rest and breathe slowly and easily.  It can be helpful to keep track of a log of your progress. Your caregiver can provide you with a simple table to help with this. If you are using the spirometer at home, follow these instructions: [place] IF:   You are having difficultly using the spirometer.  You have trouble using the spirometer as often as instructed.  Your pain medication is not giving enough relief while using the spirometer.  You develop fever of 100.5 F (38.1 C) or higher. SEEK IMMEDIATE MEDICAL CARE IF:   You cough up bloody sputum that had not been present before.  You develop fever of 102 F (38.9 C) or greater.  You develop worsening pain at or near the incision site. MAKE SURE YOU:   Understand these instructions.  Will watch your condition.  Will get help right away if you are not doing well or get worse. Document Released: 01/31/2007 Document Revised: 12/13/2011 Document Reviewed: 04/03/2007 Exit[employer] Patient Information 2014 Exit[employer], [place].   ________________________________________________________________________  WHAT IS A BLOOD TRANSFUSION? Blood Transfusion Information  A transfusion is the replacement of blood or some of its parts. Blood is made up of multiple cells which provide different functions.  Red blood cells carry oxygen and are used for blood loss replacement.  White blood cells fight against infection.  Platelets control bleeding.  Plasma helps clot blood.  Other blood products are available for specialized needs, such as hemophilia or other clotting disorders. BEFORE THE TRANSFUSION  Who gives blood for transfusions?   Healthy volunteers who are fully evaluated to make sure their blood is safe. This is blood bank blood. Transfusion therapy is the safest it has ever been in the  practice of medicine. Before blood is taken from a donor, a complete history is taken to make sure that person has no history of diseases nor engages in risky social behavior (examples are intravenous drug use or sexual activity with multiple partners). The donor's travel history is screened to minimize risk of transmitting infections, such as malaria. The donated blood is tested for signs of infectious diseases, such as HIV and hepatitis. The blood is then tested to be sure it is compatible with you in order to minimize the chance of a transfusion reaction. If you or a relative donates blood, this is often done in anticipation of surgery and is not appropriate for emergency situations. It takes many days to process the donated blood. RISKS AND COMPLICATIONS Although transfusion therapy is very safe and saves many lives, the main dangers of transfusion include:   Getting an infectious disease.  Developing a transfusion reaction. This is an allergic reaction to something in the blood you were given. Every precaution is taken to prevent this. The decision to have a blood transfusion has been considered carefully by your caregiver before blood is given. Blood is not given unless the benefits outweigh the risks. AFTER THE TRANSFUSION  Right after receiving a blood transfusion, you will usually feel much better and more energetic. This is especially true if your red blood cells have gotten low (anemic). The transfusion raises the level of the red blood cells which carry oxygen, and this usually causes an energy increase.  The nurse administering the transfusion will monitor you carefully for complications. HOME CARE INSTRUCTIONS  No special instructions are needed after a transfusion. You may find your energy is better. Speak with your caregiver about any limitations on activity for underlying diseases you may have. SEEK MEDICAL CARE IF:   Your condition is not improving after your transfusion.  You  develop redness or irritation at the intravenous (IV) site. SEEK IMMEDIATE MEDICAL CARE IF:  Any of the following symptoms occur over the next 12 hours:  Shaking chills.  You have a temperature by mouth above 102 F (38.9 C), not controlled by medicine.  Chest, back, or muscle pain.  People around you feel you are not acting correctly or are confused.  Shortness of breath or difficulty breathing.  Dizziness and fainting.  You get a rash or develop hives.  You have a decrease in urine output.  Your urine turns a dark color or changes to pink, red, or brown. Any of the following symptoms occur over the next 10 days:  You have a temperature by mouth above 102 F (38.9 C), not controlled by medicine.  Shortness of breath.  Weakness after normal activity.  The white part of the eye turns yellow (jaundice).  You have a decrease in the amount of urine or are urinating less often.  Your urine turns a dark color or changes to pink, red, or brown. Document Released: 09/17/2000 Document Revised: 12/13/2011 Document Reviewed: 05/06/2008 [facility] Patient Information 2014 [place], [place].  _______________________________________________________________________

## 2017-12-09 ENCOUNTER — Other Ambulatory Visit: Payer: Self-pay

## 2017-12-09 ENCOUNTER — Encounter (HOSPITAL_COMMUNITY)
Admission: RE | Admit: 2017-12-09 | Discharge: 2017-12-09 | Disposition: A | Discharge status: Home / Self Care | Payer: Medicare HMO | Source: Ambulatory Visit | Attending: Orthopedic Surgery | Admitting: Orthopedic Surgery

## 2017-12-09 ENCOUNTER — Encounter (HOSPITAL_COMMUNITY): Payer: Self-pay

## 2017-12-09 ENCOUNTER — Ambulatory Visit (HOSPITAL_COMMUNITY)
Admission: RE | Admit: 2017-12-09 | Discharge: 2017-12-09 | Disposition: A | Discharge status: Home / Self Care | Payer: Medicare HMO | Source: Ambulatory Visit | Attending: Orthopedic Surgery | Admitting: Orthopedic Surgery

## 2017-12-09 DIAGNOSIS — M1611 Unilateral primary osteoarthritis, right hip: Secondary | ICD-10-CM | POA: Insufficient documentation

## 2017-12-09 DIAGNOSIS — Z01818 Encounter for other preprocedural examination: Secondary | ICD-10-CM | POA: Insufficient documentation

## 2017-12-09 LAB — URINALYSIS, ROUTINE W REFLEX MICROSCOPIC
BILIRUBIN URINE: NEGATIVE
Glucose, UA: NEGATIVE mg/dL
HGB URINE DIPSTICK: NEGATIVE
Ketones, ur: NEGATIVE mg/dL
Leukocytes, UA: NEGATIVE
Nitrite: NEGATIVE
PH: 6 (ref 5.0–8.0)
Protein, ur: NEGATIVE mg/dL
SPECIFIC GRAVITY, URINE: 1.005 (ref 1.005–1.030)

## 2017-12-09 LAB — BASIC METABOLIC PANEL
ANION GAP: 9 (ref 5–15)
BUN: 17 mg/dL (ref 6–20)
CO2: 26 mmol/L (ref 22–32)
Calcium: 9.7 mg/dL (ref 8.9–10.3)
Chloride: 108 mmol/L (ref 101–111)
Creatinine, Ser: 1.38 mg/dL — ABNORMAL HIGH (ref 0.44–1.00)
GFR calc Af Amer: 44 mL/min — ABNORMAL LOW (ref >60)
GFR calc non Af Amer: 38 mL/min — ABNORMAL LOW (ref >60)
Glucose, Bld: 92 mg/dL (ref 65–99)
POTASSIUM: 4.5 mmol/L (ref 3.5–5.1)
SODIUM: 143 mmol/L (ref 135–145)

## 2017-12-09 LAB — CBC WITH DIFFERENTIAL/PLATELET
BASOS ABS: 0.1 10*3/uL (ref 0.0–0.1)
Basophils Relative: 1 %
EOS PCT: 2 %
Eosinophils Absolute: 0.2 10*3/uL (ref 0.0–0.7)
HCT: 35.7 % — ABNORMAL LOW (ref 36.0–46.0)
Hemoglobin: 11.6 g/dL — ABNORMAL LOW (ref 12.0–15.0)
LYMPHS PCT: 21 %
Lymphs Abs: 1.3 10*3/uL (ref 0.7–4.0)
MCH: 31.9 pg (ref 26.0–34.0)
MCHC: 32.5 g/dL (ref 30.0–36.0)
MCV: 98.1 fL (ref 78.0–100.0)
Monocytes Absolute: 0.5 10*3/uL (ref 0.1–1.0)
Monocytes Relative: 8 %
Neutro Abs: 4.2 10*3/uL (ref 1.7–7.7)
Neutrophils Relative %: 68 %
PLATELETS: 275 10*3/uL (ref 150–400)
RBC: 3.64 MIL/uL — ABNORMAL LOW (ref 3.87–5.11)
RDW: 14.1 % (ref 11.5–15.5)
WBC: 6.2 10*3/uL (ref 4.0–10.5)

## 2017-12-09 LAB — SURGICAL PCR SCREEN
MRSA, PCR: NEGATIVE
Staphylococcus aureus: NEGATIVE

## 2017-12-09 LAB — ABO/RH: ABO/RH(D): O POS

## 2017-12-09 LAB — APTT: aPTT: 31 seconds (ref 24–36)

## 2017-12-09 LAB — PROTIME-INR
INR: 1.04
PROTHROMBIN TIME: 13.5 s (ref 11.4–15.2)

## 2017-12-15 MED ORDER — TRANEXAMIC ACID 1000 MG/10ML IV SOLN
2000.0000 mg | INTRAVENOUS | Status: DC
  Filled 2017-12-15: qty 20

## 2017-12-15 NOTE — Anesthesia Preprocedure Evaluation (Addendum)
Anesthesia Evaluation  Patient identified by MRN, date of birth, ID band Patient awake    Reviewed: Allergy & Precautions, NPO status , Patient's Chart, lab work & pertinent test results  Airway Mallampati: II  TM Distance: >3 FB Neck ROM: Full    Dental no notable dental hx.    Pulmonary neg pulmonary ROS, former smoker,    Pulmonary exam normal        Cardiovascular hypertension, + Peripheral Vascular Disease  Normal cardiovascular exam     Neuro/Psych negative neurological ROS     GI/Hepatic   Endo/Other    Renal/GU Renal InsufficiencyRenal disease     Musculoskeletal  (+) Arthritis , Rheumatoid disorders,    Abdominal   Peds  Hematology   Anesthesia Other Findings   Reproductive/Obstetrics                            Lab Results  Component Value Date   WBC 6.2 12/09/2017   HGB 11.6 (L) 12/09/2017   HCT 35.7 (L) 12/09/2017   MCV 98.1 12/09/2017   PLT 275 12/09/2017   Lab Results  Component Value Date   CREATININE 1.38 (H) 12/09/2017   BUN 17 12/09/2017   NA 143 12/09/2017   K 4.5 12/09/2017   CL 108 12/09/2017   CO2 26 12/09/2017     Anesthesia Physical Anesthesia Plan  ASA: III  Anesthesia Plan: Regional and Spinal   Post-op Pain Management:  Regional for Post-op pain   Induction:   PONV Risk Score and Plan:   Airway Management Planned: Mask, Natural Airway and Nasal Cannula  Additional Equipment:   Intra-op Plan:   Post-operative Plan:   Informed Consent: I have reviewed the patients History and Physical, chart, labs and discussed the procedure including the risks, benefits and alternatives for the proposed anesthesia with the patient or authorized representative who has indicated his/her understanding and acceptance.     Plan Discussed with: CRNA and Anesthesiologist  Anesthesia Plan Comments:         Anesthesia Quick Evaluation

## 2017-12-16 ENCOUNTER — Encounter (HOSPITAL_COMMUNITY)
Admission: RE | Disposition: A | Discharge status: Home Health | Payer: Self-pay | Source: Ambulatory Visit | Attending: Orthopedic Surgery

## 2017-12-16 ENCOUNTER — Inpatient Hospital Stay (HOSPITAL_COMMUNITY)
Admission: RE | Admit: 2017-12-16 | Discharge: 2017-12-18 | DRG: 470 | Disposition: A | Discharge status: Home Health | Payer: Medicare HMO | Source: Ambulatory Visit | Attending: Orthopedic Surgery | Admitting: Orthopedic Surgery

## 2017-12-16 ENCOUNTER — Encounter (HOSPITAL_COMMUNITY): Payer: Self-pay

## 2017-12-16 ENCOUNTER — Inpatient Hospital Stay (HOSPITAL_COMMUNITY): Payer: Medicare HMO | Admitting: Anesthesiology

## 2017-12-16 ENCOUNTER — Other Ambulatory Visit: Payer: Self-pay

## 2017-12-16 DIAGNOSIS — E039 Hypothyroidism, unspecified: Secondary | ICD-10-CM | POA: Diagnosis not present

## 2017-12-16 DIAGNOSIS — Z96651 Presence of right artificial knee joint: Secondary | ICD-10-CM | POA: Diagnosis not present

## 2017-12-16 DIAGNOSIS — I129 Hypertensive chronic kidney disease with stage 1 through stage 4 chronic kidney disease, or unspecified chronic kidney disease: Secondary | ICD-10-CM | POA: Diagnosis present

## 2017-12-16 DIAGNOSIS — Z885 Allergy status to narcotic agent status: Secondary | ICD-10-CM

## 2017-12-16 DIAGNOSIS — Z8249 Family history of ischemic heart disease and other diseases of the circulatory system: Secondary | ICD-10-CM | POA: Diagnosis not present

## 2017-12-16 DIAGNOSIS — M069 Rheumatoid arthritis, unspecified: Secondary | ICD-10-CM | POA: Diagnosis present

## 2017-12-16 DIAGNOSIS — Z96652 Presence of left artificial knee joint: Secondary | ICD-10-CM | POA: Diagnosis present

## 2017-12-16 DIAGNOSIS — Z91048 Other nonmedicinal substance allergy status: Secondary | ICD-10-CM

## 2017-12-16 DIAGNOSIS — Z801 Family history of malignant neoplasm of trachea, bronchus and lung: Secondary | ICD-10-CM | POA: Diagnosis not present

## 2017-12-16 DIAGNOSIS — Z9842 Cataract extraction status, left eye: Secondary | ICD-10-CM | POA: Diagnosis not present

## 2017-12-16 DIAGNOSIS — Z9841 Cataract extraction status, right eye: Secondary | ICD-10-CM

## 2017-12-16 DIAGNOSIS — Z833 Family history of diabetes mellitus: Secondary | ICD-10-CM | POA: Diagnosis not present

## 2017-12-16 DIAGNOSIS — M1711 Unilateral primary osteoarthritis, right knee: Principal | ICD-10-CM | POA: Diagnosis present

## 2017-12-16 DIAGNOSIS — D62 Acute posthemorrhagic anemia: Secondary | ICD-10-CM | POA: Diagnosis not present

## 2017-12-16 DIAGNOSIS — N183 Chronic kidney disease, stage 3 (moderate): Secondary | ICD-10-CM | POA: Diagnosis not present

## 2017-12-16 DIAGNOSIS — Z87891 Personal history of nicotine dependence: Secondary | ICD-10-CM

## 2017-12-16 DIAGNOSIS — G8918 Other acute postprocedural pain: Secondary | ICD-10-CM | POA: Diagnosis not present

## 2017-12-16 DIAGNOSIS — Z91030 Bee allergy status: Secondary | ICD-10-CM

## 2017-12-16 HISTORY — PX: TOTAL KNEE ARTHROPLASTY: SHX125

## 2017-12-16 LAB — TYPE AND SCREEN
ABO/RH(D): O POS
ANTIBODY SCREEN: NEGATIVE

## 2017-12-16 SURGERY — ARTHROPLASTY, KNEE, TOTAL
Anesthesia: Regional | Site: Knee | Laterality: Right

## 2017-12-16 MED ORDER — BUPIVACAINE IN DEXTROSE 0.75-8.25 % IT SOLN
INTRATHECAL | Status: DC | PRN
  Administered 2017-12-16: 2 mL via INTRATHECAL

## 2017-12-16 MED ORDER — BISACODYL 5 MG PO TBEC: 5.0000 mg | DELAYED_RELEASE_TABLET | Freq: Every day | ORAL | Status: DC | PRN

## 2017-12-16 MED ORDER — METHOCARBAMOL 500 MG PO TABS
500.0000 mg | ORAL_TABLET | Freq: Four times a day (QID) | ORAL | Status: DC | PRN
  Administered 2017-12-16 – 2017-12-17 (×4): 500 mg via ORAL
  Filled 2017-12-16 (×4): qty 1

## 2017-12-16 MED ORDER — ASPIRIN EC 325 MG PO TBEC: 325.0000 mg | DELAYED_RELEASE_TABLET | Freq: Two times a day (BID) | ORAL | Status: DC

## 2017-12-16 MED ORDER — HYDROCODONE-ACETAMINOPHEN 7.5-325 MG PO TABS: 1.0000 | ORAL_TABLET | Freq: Once | ORAL | Status: DC | PRN

## 2017-12-16 MED ORDER — ASPIRIN EC 325 MG PO TBEC
325.0000 mg | DELAYED_RELEASE_TABLET | Freq: Two times a day (BID) | ORAL | Status: DC
  Administered 2017-12-16 – 2017-12-18 (×4): 325 mg via ORAL
  Filled 2017-12-16 (×4): qty 1

## 2017-12-16 MED ORDER — TRANEXAMIC ACID 1000 MG/10ML IV SOLN
1000.0000 mg | INTRAVENOUS | Status: AC
  Administered 2017-12-16: 1000 mg via INTRAVENOUS
  Filled 2017-12-16: qty 1100

## 2017-12-16 MED ORDER — ZOLPIDEM TARTRATE 5 MG PO TABS: 5.0000 mg | ORAL_TABLET | Freq: Every evening | ORAL | Status: DC | PRN

## 2017-12-16 MED ORDER — PROPOFOL 10 MG/ML IV BOLUS
INTRAVENOUS | Status: AC
  Filled 2017-12-16: qty 60

## 2017-12-16 MED ORDER — AMLODIPINE BESYLATE 5 MG PO TABS
5.0000 mg | ORAL_TABLET | Freq: Every day | ORAL | Status: DC
  Administered 2017-12-17 – 2017-12-18 (×2): 5 mg via ORAL
  Filled 2017-12-16 (×2): qty 1

## 2017-12-16 MED ORDER — CEFAZOLIN SODIUM-DEXTROSE 2-4 GM/100ML-% IV SOLN
2.0000 g | Freq: Four times a day (QID) | INTRAVENOUS | Status: AC
  Administered 2017-12-16 (×2): 2 g via INTRAVENOUS
  Filled 2017-12-16 (×2): qty 100

## 2017-12-16 MED ORDER — QUINAPRIL HCL 10 MG PO TABS: 40.0000 mg | ORAL_TABLET | Freq: Every day | ORAL | Status: DC

## 2017-12-16 MED ORDER — MIDAZOLAM HCL 2 MG/2ML IJ SOLN
INTRAMUSCULAR | Status: AC
  Filled 2017-12-16: qty 2

## 2017-12-16 MED ORDER — PHENYLEPHRINE HCL 10 MG/ML IJ SOLN
INTRAVENOUS | Status: DC | PRN
  Administered 2017-12-16: 30 ug/min via INTRAVENOUS

## 2017-12-16 MED ORDER — TRAZODONE HCL 100 MG PO TABS
100.0000 mg | ORAL_TABLET | Freq: Every day | ORAL | Status: DC
  Administered 2017-12-16 – 2017-12-17 (×2): 100 mg via ORAL
  Filled 2017-12-16 (×2): qty 1

## 2017-12-16 MED ORDER — TIZANIDINE HCL 2 MG PO TABS: 2.0000 mg | ORAL_TABLET | Freq: Three times a day (TID) | ORAL | 0 refills | Status: DC | PRN

## 2017-12-16 MED ORDER — HYDROMORPHONE HCL 1 MG/ML IJ SOLN
INTRAMUSCULAR | Status: AC
  Filled 2017-12-16: qty 1

## 2017-12-16 MED ORDER — HYDROXYCHLOROQUINE SULFATE 200 MG PO TABS
200.0000 mg | ORAL_TABLET | Freq: Two times a day (BID) | ORAL | Status: DC
  Administered 2017-12-16 – 2017-12-18 (×4): 200 mg via ORAL
  Filled 2017-12-16 (×4): qty 1

## 2017-12-16 MED ORDER — ACETAMINOPHEN 10 MG/ML IV SOLN: 1000.0000 mg | Freq: Once | INTRAVENOUS | Status: DC | PRN

## 2017-12-16 MED ORDER — DIPHENHYDRAMINE HCL 12.5 MG/5ML PO ELIX: 12.5000 mg | ORAL_SOLUTION | ORAL | Status: DC | PRN

## 2017-12-16 MED ORDER — ACETAMINOPHEN 500 MG PO TABS
500.0000 mg | ORAL_TABLET | Freq: Four times a day (QID) | ORAL | Status: AC
  Administered 2017-12-16 – 2017-12-17 (×4): 500 mg via ORAL
  Filled 2017-12-16 (×4): qty 1

## 2017-12-16 MED ORDER — LACTATED RINGERS IV SOLN
INTRAVENOUS | Status: DC
  Administered 2017-12-16 (×2): via INTRAVENOUS

## 2017-12-16 MED ORDER — BUPIVACAINE LIPOSOME 1.3 % IJ SUSP
20.0000 mL | Freq: Once | INTRAMUSCULAR | Status: DC
  Filled 2017-12-16: qty 20

## 2017-12-16 MED ORDER — TRANEXAMIC ACID 1000 MG/10ML IV SOLN
INTRAVENOUS | Status: AC | PRN
  Administered 2017-12-16: 2000 mg via TOPICAL

## 2017-12-16 MED ORDER — PHENYLEPHRINE HCL 10 MG/ML IJ SOLN
INTRAMUSCULAR | Status: DC | PRN
  Administered 2017-12-16: 80 ug via INTRAVENOUS
  Administered 2017-12-16 (×2): 120 ug via INTRAVENOUS
  Administered 2017-12-16 (×2): 80 ug via INTRAVENOUS

## 2017-12-16 MED ORDER — GABAPENTIN 300 MG PO CAPS
300.0000 mg | ORAL_CAPSULE | Freq: Every day | ORAL | Status: DC
  Administered 2017-12-16 – 2017-12-17 (×2): 300 mg via ORAL
  Filled 2017-12-16 (×2): qty 1

## 2017-12-16 MED ORDER — FENTANYL CITRATE (PF) 100 MCG/2ML IJ SOLN
INTRAMUSCULAR | Status: DC | PRN
  Administered 2017-12-16 (×2): 50 ug via INTRAVENOUS

## 2017-12-16 MED ORDER — FENTANYL CITRATE (PF) 100 MCG/2ML IJ SOLN
INTRAMUSCULAR | Status: AC
  Filled 2017-12-16: qty 2

## 2017-12-16 MED ORDER — BUPIVACAINE-EPINEPHRINE (PF) 0.5% -1:200000 IJ SOLN
INTRAMUSCULAR | Status: AC
  Filled 2017-12-16: qty 30

## 2017-12-16 MED ORDER — ASPIRIN EC 325 MG PO TBEC: 325.0000 mg | DELAYED_RELEASE_TABLET | Freq: Two times a day (BID) | ORAL | 0 refills | Status: DC

## 2017-12-16 MED ORDER — SODIUM CHLORIDE 0.9 % IR SOLN
Status: DC | PRN
  Administered 2017-12-16: 1000 mL

## 2017-12-16 MED ORDER — ONDANSETRON HCL 4 MG/2ML IJ SOLN: 4.0000 mg | Freq: Four times a day (QID) | INTRAMUSCULAR | Status: DC | PRN

## 2017-12-16 MED ORDER — EPHEDRINE SULFATE 50 MG/ML IJ SOLN
INTRAMUSCULAR | Status: DC | PRN
  Administered 2017-12-16 (×2): 10 mg via INTRAVENOUS

## 2017-12-16 MED ORDER — CELECOXIB 200 MG PO CAPS
200.0000 mg | ORAL_CAPSULE | Freq: Two times a day (BID) | ORAL | Status: DC
  Administered 2017-12-16 – 2017-12-18 (×4): 200 mg via ORAL
  Filled 2017-12-16 (×4): qty 1

## 2017-12-16 MED ORDER — DEXTROSE 5 % IV SOLN
500.0000 mg | Freq: Four times a day (QID) | INTRAVENOUS | Status: DC | PRN
  Administered 2017-12-16: 500 mg via INTRAVENOUS
  Filled 2017-12-16: qty 550

## 2017-12-16 MED ORDER — DOCUSATE SODIUM 100 MG PO CAPS
100.0000 mg | ORAL_CAPSULE | Freq: Two times a day (BID) | ORAL | Status: DC
  Administered 2017-12-16 – 2017-12-18 (×4): 100 mg via ORAL
  Filled 2017-12-16 (×4): qty 1

## 2017-12-16 MED ORDER — MAGNESIUM CITRATE PO SOLN: 1.0000 | Freq: Once | ORAL | Status: DC | PRN

## 2017-12-16 MED ORDER — CEFAZOLIN SODIUM-DEXTROSE 2-4 GM/100ML-% IV SOLN
2.0000 g | INTRAVENOUS | Status: AC
  Administered 2017-12-16: 2 g via INTRAVENOUS
  Filled 2017-12-16: qty 100

## 2017-12-16 MED ORDER — ONDANSETRON HCL 4 MG/2ML IJ SOLN
INTRAMUSCULAR | Status: DC | PRN
  Administered 2017-12-16: 4 mg via INTRAVENOUS

## 2017-12-16 MED ORDER — OLOPATADINE HCL 0.1 % OP SOLN
1.0000 [drp] | Freq: Two times a day (BID) | OPHTHALMIC | Status: DC
  Administered 2017-12-17 – 2017-12-18 (×2): 1 [drp] via OPHTHALMIC
  Filled 2017-12-16: qty 5

## 2017-12-16 MED ORDER — PROPOFOL 10 MG/ML IV BOLUS
INTRAVENOUS | Status: AC
  Filled 2017-12-16: qty 20

## 2017-12-16 MED ORDER — PROPOFOL 500 MG/50ML IV EMUL
INTRAVENOUS | Status: DC | PRN
  Administered 2017-12-16: 50 ug/kg/min via INTRAVENOUS

## 2017-12-16 MED ORDER — POLYETHYLENE GLYCOL 3350 17 G PO PACK
17.0000 g | PACK | Freq: Every day | ORAL | Status: DC | PRN
  Administered 2017-12-18: 17 g via ORAL
  Filled 2017-12-16: qty 1

## 2017-12-16 MED ORDER — MIDAZOLAM HCL 5 MG/5ML IJ SOLN
INTRAMUSCULAR | Status: DC | PRN
  Administered 2017-12-16 (×2): 1 mg via INTRAVENOUS

## 2017-12-16 MED ORDER — MORPHINE SULFATE (PF) 2 MG/ML IV SOLN
0.5000 mg | INTRAVENOUS | Status: DC | PRN
  Administered 2017-12-16 (×3): 1 mg via INTRAVENOUS
  Filled 2017-12-16 (×4): qty 1

## 2017-12-16 MED ORDER — ALUM & MAG HYDROXIDE-SIMETH 200-200-20 MG/5ML PO SUSP: 30.0000 mL | ORAL | Status: DC | PRN

## 2017-12-16 MED ORDER — ATORVASTATIN CALCIUM 40 MG PO TABS
40.0000 mg | ORAL_TABLET | Freq: Every day | ORAL | Status: DC
  Administered 2017-12-16 – 2017-12-17 (×2): 40 mg via ORAL
  Filled 2017-12-16 (×2): qty 1

## 2017-12-16 MED ORDER — BUPIVACAINE LIPOSOME 1.3 % IJ SUSP
20.0000 mL | Freq: Once | INTRAMUSCULAR | Status: AC
  Administered 2017-12-16: 20 mL
  Filled 2017-12-16: qty 20

## 2017-12-16 MED ORDER — CHLORHEXIDINE GLUCONATE 4 % EX LIQD: 60.0000 mL | Freq: Once | CUTANEOUS | Status: DC

## 2017-12-16 MED ORDER — BUPIVACAINE-EPINEPHRINE 0.5% -1:200000 IJ SOLN
INTRAMUSCULAR | Status: DC | PRN
  Administered 2017-12-16: 30 mL

## 2017-12-16 MED ORDER — STERILE WATER FOR IRRIGATION IR SOLN
Status: DC | PRN
  Administered 2017-12-16: 2000 mL

## 2017-12-16 MED ORDER — SODIUM CHLORIDE 0.9 % IV SOLN
INTRAVENOUS | Status: DC
  Administered 2017-12-16 – 2017-12-17 (×4): via INTRAVENOUS

## 2017-12-16 MED ORDER — LISINOPRIL 20 MG PO TABS
40.0000 mg | ORAL_TABLET | Freq: Every day | ORAL | Status: DC
  Administered 2017-12-17: 40 mg via ORAL
  Filled 2017-12-16 (×2): qty 2

## 2017-12-16 MED ORDER — MEPERIDINE HCL 50 MG/ML IJ SOLN: 6.2500 mg | INTRAMUSCULAR | Status: DC | PRN

## 2017-12-16 MED ORDER — TRAMADOL HCL 50 MG PO TABS
50.0000 mg | ORAL_TABLET | Freq: Four times a day (QID) | ORAL | Status: DC
  Administered 2017-12-16 – 2017-12-18 (×9): 50 mg via ORAL
  Filled 2017-12-16 (×9): qty 1

## 2017-12-16 MED ORDER — HYDROCODONE-ACETAMINOPHEN 10-325 MG PO TABS: 1.0000 | ORAL_TABLET | Freq: Four times a day (QID) | ORAL | 0 refills | Status: DC | PRN

## 2017-12-16 MED ORDER — HYDROCODONE-ACETAMINOPHEN 7.5-325 MG PO TABS
1.0000 | ORAL_TABLET | ORAL | Status: DC | PRN
  Administered 2017-12-16 – 2017-12-17 (×2): 2 via ORAL
  Administered 2017-12-18: 1 via ORAL
  Filled 2017-12-16 (×3): qty 2
  Filled 2017-12-16: qty 1

## 2017-12-16 MED ORDER — ONDANSETRON HCL 4 MG PO TABS: 4.0000 mg | ORAL_TABLET | Freq: Four times a day (QID) | ORAL | Status: DC | PRN

## 2017-12-16 MED ORDER — SODIUM CHLORIDE 0.9 % IJ SOLN
INTRAMUSCULAR | Status: AC
  Filled 2017-12-16: qty 50

## 2017-12-16 MED ORDER — TRANEXAMIC ACID 1000 MG/10ML IV SOLN
1000.0000 mg | Freq: Once | INTRAVENOUS | Status: AC
  Administered 2017-12-16: 1000 mg via INTRAVENOUS
  Filled 2017-12-16: qty 10

## 2017-12-16 MED ORDER — LEVOTHYROXINE SODIUM 50 MCG PO TABS
50.0000 ug | ORAL_TABLET | Freq: Every day | ORAL | Status: DC
  Administered 2017-12-17 – 2017-12-18 (×2): 50 ug via ORAL
  Filled 2017-12-16 (×2): qty 1

## 2017-12-16 MED ORDER — HYDROMORPHONE HCL 1 MG/ML IJ SOLN
0.2500 mg | INTRAMUSCULAR | Status: DC | PRN
  Administered 2017-12-16 (×4): 0.5 mg via INTRAVENOUS

## 2017-12-16 MED ORDER — FERROUS SULFATE 325 (65 FE) MG PO TABS
325.0000 mg | ORAL_TABLET | Freq: Two times a day (BID) | ORAL | Status: DC
  Administered 2017-12-17 – 2017-12-18 (×3): 325 mg via ORAL
  Filled 2017-12-16 (×3): qty 1

## 2017-12-16 MED ORDER — 0.9 % SODIUM CHLORIDE (POUR BTL) OPTIME
TOPICAL | Status: DC | PRN
  Administered 2017-12-16: 1000 mL

## 2017-12-16 MED ORDER — PROMETHAZINE HCL 25 MG/ML IJ SOLN: 6.2500 mg | INTRAMUSCULAR | Status: DC | PRN

## 2017-12-16 SURGICAL SUPPLY — 48 items
BAG ZIPLOCK 12X15 (MISCELLANEOUS) ×2 IMPLANT
BANDAGE ACE 6X5 VEL STRL LF (GAUZE/BANDAGES/DRESSINGS) ×2 IMPLANT
BENZOIN TINCTURE PRP APPL 2/3 (GAUZE/BANDAGES/DRESSINGS) ×2 IMPLANT
BLADE SAG 18X100X1.27 (BLADE) ×2 IMPLANT
BLADE SAW SGTL 13.0X1.19X90.0M (BLADE) ×2 IMPLANT
BOOTIES KNEE HIGH SLOAN (MISCELLANEOUS) ×2 IMPLANT
BOWL SMART MIX CTS (DISPOSABLE) ×2 IMPLANT
CAPT KNEE TOTAL 3 ATTUNE ×2 IMPLANT
CEMENT HV SMART SET (Cement) ×4 IMPLANT
CUFF TOURN SGL QUICK 34 (TOURNIQUET CUFF) ×1
CUFF TRNQT CYL 34X4X40X1 (TOURNIQUET CUFF) ×1 IMPLANT
DRAPE TOP 10253 STERILE (DRAPES) IMPLANT
DRAPE U-SHAPE 47X51 STRL (DRAPES) ×2 IMPLANT
DRESSING AQUACEL AG SP 3.5X10 (GAUZE/BANDAGES/DRESSINGS) ×1 IMPLANT
DRSG AQUACEL AG SP 3.5X10 (GAUZE/BANDAGES/DRESSINGS) ×2
DRSG PAD ABDOMINAL 8X10 ST (GAUZE/BANDAGES/DRESSINGS) ×2 IMPLANT
DURAPREP 26ML APPLICATOR (WOUND CARE) ×2 IMPLANT
ELECT REM PT RETURN 15FT ADLT (MISCELLANEOUS) ×2 IMPLANT
GLOVE BIOGEL PI IND STRL 7.5 (GLOVE) ×5 IMPLANT
GLOVE BIOGEL PI IND STRL 8 (GLOVE) ×2 IMPLANT
GLOVE BIOGEL PI INDICATOR 7.5 (GLOVE) ×5
GLOVE BIOGEL PI INDICATOR 8 (GLOVE) ×2
GLOVE ECLIPSE 7.5 STRL STRAW (GLOVE) ×4 IMPLANT
GLOVE SURG SS PI 7.5 STRL IVOR (GLOVE) ×2 IMPLANT
GOWN SPEC L3 XXLG W/TWL (GOWN DISPOSABLE) ×2 IMPLANT
GOWN STRL REUS W/TWL XL LVL3 (GOWN DISPOSABLE) ×6 IMPLANT
HANDPIECE INTERPULSE COAX TIP (DISPOSABLE) ×1
HOOD PEEL AWAY FLYTE STAYCOOL (MISCELLANEOUS) ×6 IMPLANT
IMMOBILIZER KNEE 20 (SOFTGOODS) ×2
IMMOBILIZER KNEE 20 THIGH 36 (SOFTGOODS) ×1 IMPLANT
MANIFOLD NEPTUNE II (INSTRUMENTS) ×2 IMPLANT
NEEDLE HYPO 22GX1.5 SAFETY (NEEDLE) ×2 IMPLANT
PACK ICE MAXI GEL EZY WRAP (MISCELLANEOUS) ×2 IMPLANT
PACK TOTAL KNEE CUSTOM (KITS) ×2 IMPLANT
PADDING CAST COTTON 6X4 STRL (CAST SUPPLIES) ×2 IMPLANT
POSITIONER SURGICAL ARM (MISCELLANEOUS) ×2 IMPLANT
SET HNDPC FAN SPRY TIP SCT (DISPOSABLE) ×1 IMPLANT
STRIP CLOSURE SKIN 1/2X4 (GAUZE/BANDAGES/DRESSINGS) ×4 IMPLANT
SUT MNCRL AB 3-0 PS2 18 (SUTURE) ×2 IMPLANT
SUT VIC AB 0 CT1 36 (SUTURE) ×2 IMPLANT
SUT VIC AB 1 CT1 36 (SUTURE) ×4 IMPLANT
SUT VIC AB 2-0 CT1 27 (SUTURE) ×2
SUT VIC AB 2-0 CT1 TAPERPNT 27 (SUTURE) ×2 IMPLANT
SWABSTK COMLB BENZOIN TINCTURE (MISCELLANEOUS) ×2 IMPLANT
TOWEL OR NON WOVEN STRL DISP B (DISPOSABLE) ×2 IMPLANT
TRAY FOLEY CATH SILVER 14FR (SET/KITS/TRAYS/PACK) ×2 IMPLANT
WRAP KNEE MAXI GEL POST OP (GAUZE/BANDAGES/DRESSINGS) ×2 IMPLANT
YANKAUER SUCT BULB TIP 10FT TU (MISCELLANEOUS) ×2 IMPLANT

## 2017-12-16 NOTE — Progress Notes (Signed)
Assisted Dr. Hollis with right, ultrasound guided, adductor canal block. Side rails up, monitors on throughout procedure. See vital signs in flow sheet. Tolerated Procedure well.  

## 2017-12-16 NOTE — Progress Notes (Signed)
Pt c/o unrelieved 6-7/10 pain to right knee. Notified on-call physician. Received a call back from [employer]. Pt is pretty much on all the pain medications that they can give her. Did give new orders for Celebrex 200mg  BID.

## 2017-12-16 NOTE — Transfer of Care (Signed)
Immediate Anesthesia Transfer of Care Note  Patient: Melody Moore  Procedure(s) Performed: RIGHT TOTAL KNEE ARTHROPLASTY (Right Knee)  Patient Location: PACU  Anesthesia Type:Spinal  Level of Consciousness: awake, alert  and oriented  Airway & Oxygen Therapy: Patient Spontanous Breathing and Patient connected to face mask oxygen  Post-op Assessment: Report given to RN and Post -op Vital signs reviewed and stable  Post vital signs: Reviewed and stable  Last Vitals:  Vitals:   12/16/17 0718 12/16/17 0719  BP: (!) 152/85 (!) 146/66  Pulse: 95 92  Resp: 19 (!) 21  Temp:    SpO2: 99% 98%    Last Pain:  Vitals:   12/16/17 0607  TempSrc: Oral         Complications: No apparent anesthesia complications

## 2017-12-16 NOTE — Op Note (Signed)
NAME:  Melody Moore, HANZLIK NO.:  [id number]  MEDICAL RECORD NO.:  [id number]  LOCATION:  [facility]                         FACILITY:  [facility]  PHYSICIAN:  Alta Corning, M.D.   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  PROCEDURES PERFORMED: 1. Right total knee replacement with Attune System, size 4 femur, size     5 tibia, 6-mm bridging bearing, and 38-mm all polyethylene patella. 2. Lateral retinacular release.   SURGEON:  Alta Corning, MD.  ASSISTANT:  Gary Fleet, PA.  ANESTHESIA:  General.  BRIEF HISTORY:  Ms. Cuccia is a 67 year old female with a long history of significant complaints of right knee pain.  She had been treated conservatively for a prolonged period of time.  After failure of all conservative care, she was taken to the operating room for right total knee replacement.  The patient has had a previous left total knee replacement and had done well with that.  The patient had an x-ray showing severe bone-on-bone change with medial subluxation of the femur on the tibia. She was having night pain and light activity pain.  DESCRIPTION OF PROCEDURE:  The patient was brought to the operating room, and after adequate anesthesia was obtained with a spinal anesthetic, the patient was placed supine on the operating table.  The right leg was prepped and draped in the usual sterile fashion. Following this, the leg was exsanguinated.  Blood pressure tourniquet was inflated to 300 mmHg.  Following this, a midline incision was made, and the subcutaneous tissue was dissected down to the level of the extensor  mechanism and a medial parapatellar arthrotomy was undertaken.  Following this, attention was turned to the knee, where the medial and lateral menisci were removed, retropatellar fat pad, synovium on the anterior aspect of the femur, and the anterior and posterior cruciates.  Following this, attention was turned towards drilling a pilot hole into the femur.  This was drilled and a rod was placed, allowing access for a 9-mm distal resection and with a 4-degree valgus inclination cut.  Once this was done, attention was turned to sizing and sized to a 4.  Anterior and posterior cuts were made, chamfers and box.  Attention was then turned to the tibia.  It was cut perpendicular to its long axis with 3-degree posterior slope, and then sized to a 5 and it was drilled and keeled.  Following this, the trial components were put in place with a 6 spacer.  Excellent range of motion and stability were achieved.  Attention was turned to the patella, it was cut down to a level of 13 mm, and the poly was placed.  Knee was put through a range of motion. It really was subluxing laterally with the patella.  There were all kinds of tissue on the lateral side.  We then did a lateral retinacular release carefully, all the way up  to the muscle layer and down to the joint line.  This allowed for a midline patellar tracking.  Once this was done, the knee was put through a range of motion.  Excellent stability and range of motion were achieved at this point.  At this point, the trial components were removed.  The knee was copiously and thoroughly lavaged with pulsatile lavage irrigation and suctioned dry.  Attention was turned towards placement of the final components.  A size 5 tibia was cemented, size 4 femur with a 6-mm bridging bearing trial, and a 38 all poly patella was placed and held with a clamp.  Once this was completed, the tourniquet was let down and all bleeding was controlled with  electrocautery.  We then tested the 6 again with the tourniquet down.  Excellent range of motion and stability were achieved. The final 6 poly was then opened and placed.  WOUND CLOSURE:  The medial parapatellar arthrotomy was then closed with 1 Vicryl running, and the skin with 2-0 Vicryl and 3-0 Monocryl subcuticular.  Benzoin and Steri-Strips were applied.  A sterile compressive dressing was applied.  CONDITION:  The patient was taken to the recovery room and was noted to be in satisfactory condition.  The estimated blood loss for the procedure was minimal.  The total tourniquet time was just under an hour, but the final can be got from the anesthetic record.     Alta Corning, M.D.     Corliss Skains  D:  12/16/2017  T:  12/16/2017  Job:  [id number]

## 2017-12-16 NOTE — Anesthesia Postprocedure Evaluation (Signed)
Anesthesia Post Note  Patient: Melody Moore  Procedure(s) Performed: RIGHT TOTAL KNEE ARTHROPLASTY (Right Knee)     Patient location during evaluation: PACU Anesthesia Type: Regional Level of consciousness: oriented and awake and alert Pain management: pain level controlled Vital Signs Assessment: post-procedure vital signs reviewed and stable Respiratory status: spontaneous breathing, respiratory function stable and patient connected to nasal cannula oxygen Cardiovascular status: blood pressure returned to baseline and stable Postop Assessment: no headache, no backache and no apparent nausea or vomiting Anesthetic complications: no    Last Vitals:  Vitals:   12/16/17 0719 12/16/17 0930  BP: (!) 146/66 130/64  Pulse: 92 90  Resp: (!) 21 17  Temp:  37 C  SpO2: 98% 99%    Last Pain:  Vitals:   12/16/17 1030  TempSrc:   PainSc: 5                  Barnet Glasgow

## 2017-12-16 NOTE — Anesthesia Procedure Notes (Signed)
Anesthesia Regional Block: Adductor canal block   Pre-Anesthetic Checklist: ,, timeout performed, Correct Patient, Correct Site, Correct Laterality, Correct Procedure, Correct Position, site marked, Risks and benefits discussed,  Surgical consent,  Pre-op evaluation,  At surgeon's request and post-op pain management  Laterality: Right  Prep: chloraprep       Needles:  Injection technique: Single-shot  Needle Type: Echogenic Needle     Needle Length: 9cm  Needle Gauge: 21     Additional Needles:   Procedures:,,,, ultrasound used (permanent image in chart),,,,  Narrative:  Start time: 12/16/2017 7:13 AM End time: 12/16/2017 7:22 AM Injection made incrementally with aspirations every 5 mL.  Performed by: Personally  Anesthesiologist: Barnet Glasgow, MD

## 2017-12-16 NOTE — Discharge Instructions (Signed)

## 2017-12-16 NOTE — Anesthesia Procedure Notes (Signed)
Spinal  Patient location during procedure: OR Start time: 12/16/2017 7:28 AM End time: 12/16/2017 7:32 AM Staffing Anesthesiologist: Barnet Glasgow, MD Performed: anesthesiologist  Spinal Block Patient position: sitting Prep: ChloraPrep Patient monitoring: cardiac monitor Approach: midline Location: L3-4 Injection technique: single-shot Needle Needle type: Pencan  Needle gauge: 24 G Needle length: 9 cm Assessment Sensory level: T4

## 2017-12-16 NOTE — Brief Op Note (Signed)
12/16/2017  9:01 AM  PATIENT:  Melody Moore  67 y.o. female  PRE-OPERATIVE DIAGNOSIS:  RIGHT KNEE DEGENERATIVE JOINT DISEASE  POST-OPERATIVE DIAGNOSIS:  RIGHT KNEE DEGENERATIVE JOINT DISEASE  PROCEDURE:  Procedure(s) with comments: RIGHT TOTAL KNEE ARTHROPLASTY (Right) - Adductor Block  SURGEON:  Surgeon(s) and Role:    Dorna Leitz, MD - Primary  PHYSICIAN ASSISTANT:   ASSISTANTS: bethune   ANESTHESIA:   spinal  EBL:  50 mL   BLOOD ADMINISTERED:none  DRAINS: none   LOCAL MEDICATIONS USED:  MARCAINE    and OTHER experel  SPECIMEN:  No Specimen  DISPOSITION OF SPECIMEN:  N/A  COUNTS:  YES  TOURNIQUET:   Total Tourniquet Time Documented: Thigh (Right) - 56 minutes Total: Thigh (Right) - 56 minutes   DICTATION: .Other Dictation: Dictation Number [id number]  PLAN OF CARE: Admit to inpatient   PATIENT DISPOSITION:  PACU - hemodynamically stable.   Delay start of Pharmacological VTE agent (>24hrs) due to surgical blood loss or risk of bleeding: no

## 2017-12-16 NOTE — H&P (Signed)
TOTAL KNEE ADMISSION H&P  Patient is being admitted for right total knee arthroplasty.  Subjective:  Chief Complaint:right knee pain.  HPI: Melody Moore, 67 y.o. female, has a history of pain and functional disability in the right knee due to arthritis and has failed non-surgical conservative treatments for greater than 12 weeks to includeNSAID's and/or analgesics, corticosteriod injections, viscosupplementation injections, weight reduction as appropriate and activity modification.  Onset of symptoms was gradual, starting 5 years ago with gradually worsening course since that time. The patient noted no past surgery on the right knee(s).  Patient currently rates pain in the right knee(s) at 10 out of 10 with activity. Patient has night pain, worsening of pain with activity and weight bearing, pain that interferes with activities of daily living, pain with passive range of motion, crepitus and joint swelling.  Patient has evidence of subchondral sclerosis, periarticular osteophytes, joint subluxation and joint space narrowing by imaging studies. This patient has had Failure of all reasonable conservative care. There is no active infection.  Patient Active Problem List   Diagnosis Date Noted  . Benign hypertension with CKD (chronic kidney disease) stage III ([place]) 12/04/2017  . Primary osteoarthritis involving multiple joints 12/04/2017  . History of echocardiogram 11/07/2017  . CKD (chronic kidney disease) stage 3, GFR 30-59 ml/min ([facility]) 11/02/2017  . 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 Gallstones 06/22/2017  . Elevated serum creatinine 06/15/2017  . Elevated alkaline phosphatase level 06/15/2017  . Transaminitis 06/15/2017  . Acquired hypothyroidism 05/26/2017  . Essential hypertension 05/26/2017  . H/O total knee replacement, left 05/26/2017  . History of adrenal insufficiency 05/26/2017  . Rheumatoid arthritis involving multiple sites ([place]) 05/26/2017  . Osteoarthritis  of right knee 05/26/2017  . Allergic rhinitis 05/26/2017  . Right carotid bruit 05/26/2017  . Carotid stenosis, asymptomatic, bilateral 05/26/2017  . Allergy to honey bee venom 05/26/2017   Past Medical History:  Diagnosis Date  . Adrenal crisis ([place]) 02/12/2016  . AKI (acute kidney injury) ([place]) 02/2016  . Allergy   . Arthritis   . Carotid stenosis, asymptomatic, bilateral 05/26/2017   Dopplers 02/12/2016: right - mid-distal 40-59% stenosis; left - 40-59% stenosis  . Cataract   . CKD (chronic kidney disease) stage 3, GFR 30-59 ml/min ([facility]) 11/02/2017  . Decreased calculated GFR 06/15/2017  . Gallstones 06/22/2017  . History of echocardiogram 11/07/2017   04/09/2016: EF 71%, mild diastolic dysfunction, trivial MR and TR  . Hypertension   . Hypothyroidism   . 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 Rheumatoid arthritis ([place])   . Rheumatoid arthritis involving multiple sites ([place]) 05/26/2017  . Transaminitis 06/15/2017   Normal abdominal US 06/22/17    Past Surgical History:  Procedure Laterality Date  . APPENDECTOMY    . CATARACT EXTRACTION, BILATERAL  2015  . 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    Current Facility-Administered Medications  Medication Dose Route Frequency Provider Last Rate Last Dose  . bupivacaine liposome (EXPAREL) 1.3 % injection 266 mg  20 mL Infiltration Once Dorna Leitz, MD      . bupivacaine liposome (EXPAREL) 1.3 % injection 266 mg  20 mL Infiltration Once Dorna Leitz, MD      . ceFAZolin (ANCEF) IVPB 2g/100 mL premix  2 g Intravenous On Call to OR Dorna Leitz, MD      . chlorhexidine (HIBICLENS) 4 % liquid 4 application  60 mL Topical Once Dorna Leitz, MD      . lactated ringers infusion   Intravenous Continuous  Dorna Leitz, MD      . tranexamic acid (CYKLOKAPRON) 1,000 mg in sodium chloride 0.9 % 100 mL IVPB  1,000 mg Intravenous To OR Damilola Flamm, MD      . tranexamic acid (CYKLOKAPRON) 2,000 mg in sodium chloride 0.9 % 50 mL Topical  Application  0,277 mg Topical To OR Green, Terri L, RPH       Allergies  Allergen Reactions  . Bee Venom Anaphylaxis  . Oxycodone Other (See Comments)    Adrenal crisis while taking both prednisone and oxycodone   . Prednisone     Adrenal crisis while taking both prednisone and oxycodone   . Verapamil Hives  . Nickel Rash  . Nitrofuran Derivatives Rash    Social History   Tobacco Use  . Smoking status: Former Smoker    Types: Cigarettes    Last attempt to quit: 02/07/1974    Years since quitting: 43.8  . Smokeless tobacco: Never Used  Substance Use Topics  . Alcohol use: Yes    Alcohol/week: 0.6 oz    Types: 1 Glasses of wine per week    Family History  Problem Relation Age of Onset  . Diabetes Mother   . Lung cancer Father   . Hypertension Father      ROS ROS: I have reviewed the patient's review of systems thoroughly and there are no positive responses as relates to the HPI. Objective:  Physical Exam  Vital signs in last 24 hours: Temp:  [97.9 F (36.6 C)] 97.9 F (36.6 C) (03/15 0607) Pulse Rate:  [101] 101 (03/15 0607) Resp:  [18] 18 (03/15 0607) BP: (174)/(82) 174/82 (03/15 0607) SpO2:  [99 %] 99 % (03/15 0607) Weight:  [86.4 kg (190 lb 8 oz)] 86.4 kg (190 lb 8 oz) (03/15 0601) Well-developed well-nourished patient in no acute distress. Alert and oriented x3 HEENT:within normal limits Cardiac: Regular rate and rhythm Pulmonary: Lungs clear to auscultation Abdomen: Soft and nontender.  Normal active bowel sounds  Musculoskeletal: (Right knee: Limited range of motion.  Painful range of motion.  1+ effusion.  No instability. Labs: Recent Results (from the past 2160 hour(s))  CBC     Status: Abnormal   Collection Time: 11/03/17  1:43 PM  Result Value Ref Range   WBC 6.4 3.8 - 10.8 Thousand/uL   RBC 3.77 (L) 3.80 - 5.10 Million/uL   Hemoglobin 12.1 11.7 - 15.5 g/dL   HCT 34.9 (L) 35.0 - 45.0 %   MCV 92.6 80.0 - 100.0 fL   MCH 32.1 27.0 - 33.0 pg   MCHC  34.7 32.0 - 36.0 g/dL   RDW 13.4 11.0 - 15.0 %   Platelets 300 140 - 400 Thousand/uL   MPV 11.2 7.5 - 12.5 fL  COMPLETE METABOLIC PANEL WITH GFR     Status: Abnormal   Collection Time: 11/03/17  1:43 PM  Result Value Ref Range   Glucose, Bld 88 65 - 99 mg/dL    Comment: .            Fasting reference interval .    BUN 28 (H) 7 - 25 mg/dL   Creat 1.55 (H) 0.50 - 0.99 mg/dL    Comment: For patients >38 years of age, the reference limit for Creatinine is approximately 13% higher for people identified as African-American. .    GFR, Est Non African American 34 (L) > OR = 60 mL/min/1.14m   GFR, Est African American 39 (L) > OR = 60 mL/min/1.772m  BUN/Creatinine Ratio 18 6 - 22 (calc)   Sodium 139 135 - 146 mmol/L   Potassium 4.1 3.5 - 5.3 mmol/L   Chloride 101 98 - 110 mmol/L   CO2 30 20 - 32 mmol/L   Calcium 10.1 8.6 - 10.4 mg/dL   Total Protein 6.2 6.1 - 8.1 g/dL   Albumin 4.0 3.6 - 5.1 g/dL   Globulin 2.2 1.9 - 3.7 g/dL (calc)   AG Ratio 1.8 1.0 - 2.5 (calc)   Total Bilirubin 0.4 0.2 - 1.2 mg/dL   Alkaline phosphatase (APISO) 67 33 - 130 U/L   AST 24 10 - 35 U/L   ALT 23 6 - 29 U/L  Protime-INR     Status: None   Collection Time: 11/03/17  1:43 PM  Result Value Ref Range   INR 1.0     Comment: Reference Range                     0.9-1.1 Moderate-intensity Warfarin Therapy 2.0-3.0 Higher-intensity Warfarin Therapy   3.0-4.0  .    Prothrombin Time 10.5 9.0 - 11.5 sec    Comment: . For more information on this test, go to: http://education.questdiagnostics.com/faq/FAQ104 .   Urinalysis, Routine w reflex microscopic     Status: Abnormal   Collection Time: 12/09/17 11:14 AM  Result Value Ref Range   Color, Urine STRAW (A) YELLOW   APPearance CLEAR CLEAR   Specific Gravity, Urine 1.005 1.005 - 1.030   pH 6.0 5.0 - 8.0   Glucose, UA NEGATIVE NEGATIVE mg/dL   Hgb urine dipstick NEGATIVE NEGATIVE   Bilirubin Urine NEGATIVE NEGATIVE   Ketones, ur NEGATIVE NEGATIVE  mg/dL   Protein, ur NEGATIVE NEGATIVE mg/dL   Nitrite NEGATIVE NEGATIVE   Leukocytes, UA NEGATIVE NEGATIVE    Comment: Performed at [facility] [address], [place], [place] [address]  ABO/Rh     Status: None   Collection Time: 12/09/17 12:38 PM  Result Value Ref Range   ABO/RH(D)      O POS Performed at [facility], [place] [address] [place], [place] [address]   APTT     Status: None   Collection Time: 12/09/17 12:45 PM  Result Value Ref Range   aPTT 31 24 - 36 seconds    Comment: Performed at [facility] [place] [address], [place], [place] [address]  Basic metabolic panel     Status: Abnormal   Collection Time: 12/09/17 12:45 PM  Result Value Ref Range   Sodium 143 135 - 145 mmol/L   Potassium 4.5 3.5 - 5.1 mmol/L   Chloride 108 101 - 111 mmol/L   CO2 26 22 - 32 mmol/L   Glucose, Bld 92 65 - 99 mg/dL   BUN 17 6 - 20 mg/dL   Creatinine, Ser 1.38 (H) 0.44 - 1.00 mg/dL   Calcium 9.7 8.9 - 10.3 mg/dL   GFR calc non Af Amer 38 (L) >60 mL/min   GFR calc Af Amer 44 (L) >60 mL/min    Comment: (NOTE) The eGFR has been calculated using the CKD EPI equation. This calculation has not been validated in all clinical situations. eGFR's persistently <60 mL/min signify possible Chronic Kidney Disease.    Anion gap 9 5 - 15    Comment: Performed at [facility], [place] [address] [place], [place] [address]  CBC WITH DIFFERENTIAL     Status: Abnormal   Collection Time: 12/09/17 12:45 PM  Result Value Ref Range   WBC 6.2 4.0 - 10.5 K/uL   RBC 3.64 (L) 3.87 - 5.11 MIL/uL   Hemoglobin 11.6 (L) 12.0 - 15.0 g/dL   HCT 35.7 (L) 36.0 - 46.0 %   MCV 98.1 78.0 - 100.0 fL   MCH 31.9 26.0 - 34.0 pg   MCHC 32.5 30.0 - 36.0 g/dL   RDW 14.1 11.5 - 15.5 %   Platelets 275 150 - 400 K/uL   Neutrophils Relative % 68 %   Neutro Abs 4.2 1.7 - 7.7 K/uL   Lymphocytes Relative 21 %   Lymphs Abs 1.3 0.7 - 4.0 K/uL   Monocytes  Relative 8 %   Monocytes Absolute 0.5 0.1 - 1.0 K/uL   Eosinophils Relative 2 %   Eosinophils Absolute 0.2 0.0 - 0.7 K/uL   Basophils Relative 1 %   Basophils Absolute 0.1 0.0 - 0.1 K/uL    Comment: Performed at [facility], [place] [address], [place], [place] [address]  Protime-INR     Status: None   Collection Time: 12/09/17 12:45 PM  Result Value Ref Range   Prothrombin Time 13.5 11.4 - 15.2 seconds   INR 1.04     Comment: Performed at [facility], [place] [address] [place], [place] [address]  Type and screen Order type and screen if day of surgery is less than 15 days from draw of preadmission visit or order morning of surgery if day of surgery is greater than 6 days from preadmission visit.     Status: None   Collection Time: 12/09/17 12:45 PM  Result Value Ref Range   ABO/RH(D) O POS    Antibody Screen NEG    Sample Expiration 12/23/2017    Extend sample reason      NO TRANSFUSIONS OR PREGNANCY IN THE PAST 3 MONTHS Performed at [facility], [place] [address] [place], [place] [address]   Surgical pcr screen     Status: None   Collection Time: 12/09/17 12:45 PM  Result Value Ref Range   MRSA, PCR NEGATIVE NEGATIVE   Staphylococcus aureus NEGATIVE NEGATIVE    Comment: (NOTE) The Xpert SA Assay (FDA approved for NASAL specimens in patients 68 years of age and older), is one component of a comprehensive surveillance program. It is not intended to diagnose infection nor to guide or monitor treatment. Performed at [facility], [place] [address] [place],  [address]     Estimated body mass index is 29.84 kg/m as calculated from the following:   Height as of this encounter: 5' 7"  (1.702 m).   Weight as of this encounter: 86.4 kg (190 lb 8 oz).   Imaging Review Plain radiographs demonstrate severe degenerative joint disease of the right knee(s). The overall alignment issignificant varus. The bone  quality appears to be fair for age and reported activity level.  Assessment/Plan:  End stage arthritis, right knee   The patient history, physical examination, clinical judgment of the provider and imaging studies are consistent with end stage degenerative joint disease of the right knee(s) and total knee arthroplasty is deemed medically necessary. The treatment options including medical management, injection therapy arthroscopy and arthroplasty were discussed at length. The risks and benefits of total knee arthroplasty were presented and reviewed. The risks due to aseptic loosening, infection, stiffness, patella tracking problems, thromboembolic complications and other imponderables were discussed. The patient acknowledged the explanation, agreed to proceed with the plan and consent was signed. Patient  is being admitted for inpatient treatment for surgery, pain control, PT, OT, prophylactic antibiotics, VTE prophylaxis, progressive ambulation and ADL's and discharge planning. The patient is planning to be discharged home with home health services

## 2017-12-17 LAB — BASIC METABOLIC PANEL
ANION GAP: 7 (ref 5–15)
BUN: 13 mg/dL (ref 6–20)
CHLORIDE: 104 mmol/L (ref 101–111)
CO2: 26 mmol/L (ref 22–32)
Calcium: 8.8 mg/dL — ABNORMAL LOW (ref 8.9–10.3)
Creatinine, Ser: 1.01 mg/dL — ABNORMAL HIGH (ref 0.44–1.00)
GFR calc Af Amer: >60 mL/min (ref >60)
GFR, EST NON AFRICAN AMERICAN: 56 mL/min — AB (ref >60)
GLUCOSE: 122 mg/dL — AB (ref 65–99)
Potassium: 4.2 mmol/L (ref 3.5–5.1)
Sodium: 137 mmol/L (ref 135–145)

## 2017-12-17 LAB — CBC
HEMATOCRIT: 28.1 % — AB (ref 36.0–46.0)
HEMOGLOBIN: 9.3 g/dL — AB (ref 12.0–15.0)
MCH: 32.2 pg (ref 26.0–34.0)
MCHC: 33.1 g/dL (ref 30.0–36.0)
MCV: 97.2 fL (ref 78.0–100.0)
Platelets: 215 10*3/uL (ref 150–400)
RBC: 2.89 MIL/uL — AB (ref 3.87–5.11)
RDW: 13.6 % (ref 11.5–15.5)
WBC: 8.9 10*3/uL (ref 4.0–10.5)

## 2017-12-17 NOTE — Progress Notes (Signed)
PATIENT ID: Melody Moore  MRN: [id number]  DOB/AGE:  Nov 10, 1950 / 67 y.o.  1 Day Post-Op Procedure(s) (LRB): RIGHT TOTAL KNEE ARTHROPLASTY (Right)    PROGRESS NOTE Subjective: Patient is alert, oriented, no Nausea, no Vomiting, yes passing gas. Taking PO well with pt eating in room. Denies SOB, Chest or Calf Pain. Using Incentive Spirometer, PAS in place. Ambulate WBAT, Patient reports pain as 6/10 .    Objective: Vital signs in last 24 hours: Vitals:   12/16/17 1355 12/16/17 2027 12/17/17 0039 12/17/17 0551  BP: (!) 128/56 (!) 144/55 (!) 130/46 (!) 143/57  Pulse: 96 89 88 89  Resp: 18 17 16 15   Temp: (!) 97.5 F (36.4 C) 98.3 F (36.8 C) 98.2 F (36.8 C) 98.4 F (36.9 C)  TempSrc: Oral Oral Oral Oral  SpO2: 100% 99% 97% 98%  Weight:      Height:          Intake/Output from previous day: I/O last 3 completed shifts: In: 3970 [P.O.:515; I.V.:3090; IV Piggyback:365] Out: 2375 [Urine:2325; Blood:50]   Intake/Output this shift: No intake/output data recorded.   LABORATORY DATA: Recent Labs    12/17/17 0601  WBC 8.9  HGB 9.3*  HCT 28.1*  PLT 215  NA 137  K 4.2  CL 104  CO2 26  BUN 13  CREATININE 1.01*  GLUCOSE 122*  CALCIUM 8.8*    Examination: Neurologically intact Neurovascular intact Sensation intact distally Intact pulses distally Dorsiflexion/Plantar flexion intact Incision: dressing C/D/I and no drainage No cellulitis present Compartment soft}  Assessment:   1 Day Post-Op Procedure(s) (LRB): RIGHT TOTAL KNEE ARTHROPLASTY (Right) ADDITIONAL DIAGNOSIS: Expected Acute Blood Loss Anemia, Hypertension and Renal Insufficiency Chronic  Plan: PT/OT WBAT, AROM and PROM  DVT Prophylaxis:  SCDx72hrs, ASA 325 mg BID x 2 weeks DISCHARGE PLAN: Home DISCHARGE NEEDS: HHPT, Walker and 3-in-1 comode seat     Joanell Rising 12/17/2017, 7:47 AM

## 2017-12-17 NOTE — Evaluation (Signed)
Physical Therapy Evaluation Patient Details Name: Melody Moore MRN: [id number] DOB: Dec 10, 1950 Today's Date: 12/17/2017   History of Present Illness  Pt is a 67 y.o. female s/p R TKA.   Clinical Impression  Pt is s/p TKA resulting in the deficits listed below (see PT Problem List). PTA pt independent with functional mobility. On eval, she required min assist bed mobility, min assist transfers and min guard assist ambulation 50 feet with RW. TKA exercises initiated and handouts provided. Pt will benefit from skilled PT to increase their independence and safety with mobility to allow discharge to the venue listed below.      Follow Up Recommendations Follow surgeon's recommendation for DC plan and follow-up therapies    Equipment Recommendations  (All DME has been delivered to home. )    Recommendations for Other Services       Precautions / Restrictions Precautions Precautions: Knee Precaution Comments: Educated on no pillow under knee. Required Braces or Orthoses: Knee Immobilizer - Right Knee Immobilizer - Right: On when out of bed or walking Restrictions Weight Bearing Restrictions: Yes RLE Weight Bearing: Weight bearing as tolerated      Mobility  Bed Mobility Overal bed mobility: Needs Assistance Bed Mobility: Supine to Sit     Supine to sit: Min assist     General bed mobility comments: verbal cues for sequencing, assist with RLE  Transfers Overall transfer level: Needs assistance Equipment used: Rolling walker (2 wheeled) Transfers: Sit to/from Stand Sit to Stand: Min assist         General transfer comment: verbal cues for hand placement, assist to power up, increased time to stabilize initial standing balance  Ambulation/Gait Ambulation/Gait assistance: Min guard Ambulation Distance (Feet): 50 Feet Assistive device: Rolling walker (2 wheeled) Gait Pattern/deviations: Step-to pattern;Antalgic;Decreased stride length Gait velocity: decreased Gait  velocity interpretation: Below normal speed for age/gender General Gait Details: verbal cues for sequencing  Stairs            Wheelchair Mobility    Modified Rankin (Stroke Patients Only)       Balance Overall balance assessment: Needs assistance Sitting-balance support: No upper extremity supported;Feet supported Sitting balance-Leahy Scale: Good     Standing balance support: Bilateral upper extremity supported;During functional activity Standing balance-Leahy Scale: Poor                               Pertinent Vitals/Pain Pain Assessment: 0-10 Pain Score: 3  Pain Location: R knee Pain Descriptors / Indicators: Aching;Grimacing;Guarding Pain Intervention(s): Monitored during session;Repositioned;Ice applied    Home Living Family/patient expects to be discharged to:: Private residence Living Arrangements: Spouse/significant other Available Help at Discharge: Family Type of Home: Apartment Home Access: Level entry     Home Layout: One level Home Equipment: Cane - single point Additional Comments: [facility] has already delivered RW, [facility], CPM and shower seat.    Prior Function Level of Independence: Independent         Comments: occassion use of cane     Hand Dominance        Extremity/Trunk Assessment   Upper Extremity Assessment Upper Extremity Assessment: Overall WFL for tasks assessed    Lower Extremity Assessment Lower Extremity Assessment: RLE deficits/detail RLE Deficits / Details: expected deficits following knee surgery RLE: Unable to fully assess due to pain    Cervical / Trunk Assessment Cervical / Trunk Assessment: Normal  Communication   Communication: No difficulties  Cognition Arousal/Alertness: Awake/alert Behavior During Therapy: WFL for tasks assessed/performed Overall Cognitive Status: Within Functional Limits for tasks assessed                                        General Comments       Exercises Total Joint Exercises Ankle Circles/Pumps: AROM;10 reps;Both Quad Sets: AROM;Right;5 reps Heel Slides: AAROM;Right;5 reps   Assessment/Plan    PT Assessment Patient needs continued PT services  PT Problem List Decreased strength;Decreased mobility;Decreased range of motion;Decreased knowledge of precautions;Decreased activity tolerance;Decreased balance;Decreased knowledge of use of DME;Pain       PT Treatment Interventions DME instruction;Therapeutic activities;Gait training;Therapeutic exercise;Patient/family education;Balance training;Functional mobility training    PT Goals (Current goals can be found in the Care Plan section)  Acute Rehab PT Goals Patient Stated Goal: home tomorrow PT Goal Formulation: With patient Time For Goal Achievement: 12/31/17 Potential to Achieve Goals: Good    Frequency 7X/week   Barriers to discharge        Co-evaluation               AM-PAC PT "6 Clicks" Daily Activity  Outcome Measure Difficulty turning over in bed (including adjusting bedclothes, sheets and blankets)?: A Little Difficulty moving from lying on back to sitting on the side of the bed? : Unable Difficulty sitting down on and standing up from a chair with arms (e.g., wheelchair, bedside commode, etc,.)?: Unable Help needed moving to and from a bed to chair (including a wheelchair)?: A Little Help needed walking in hospital room?: A Little Help needed climbing 3-5 steps with a railing? : A Lot 6 Click Score: 13    End of Session Equipment Utilized During Treatment: Gait belt;Right knee immobilizer Activity Tolerance: Patient tolerated treatment well Patient left: in chair;with call bell/phone within reach;with family/visitor present Nurse Communication: Mobility status PT Visit Diagnosis: Other abnormalities of gait and mobility (R26.89);Difficulty in walking, not elsewhere classified (R26.2);Pain Pain - Right/Left: Right Pain - part of body: Knee     Time: 1005-1040 PT Time Calculation (min) (ACUTE ONLY): 35 min   Charges:   PT Evaluation $PT Eval Moderate Complexity: 1 Mod PT Treatments $Gait Training: 8-22 mins   PT G Codes:        Lorrin Goodell, PT  Office # [phone] Pager [phone]   Lorriane Shire 12/17/2017, 10:53 AM

## 2017-12-17 NOTE — Progress Notes (Signed)
Physical Therapy Treatment Patient Details Name: Melody Moore MRN: [id number] DOB: Jun 10, 1951 Today's Date: 12/17/2017    History of Present Illness Pt is a 67 y.o. female s/p R TKA.     PT Comments    Pt progressing well with mobility. She ambulated 175 feet with RW min guard assist. Plan is for d/c home tomorrow following therapy. She lives in a first floor apartment and has a handicap parking space close to the entrance. Her husband is able to provide 24-hour assist.     Follow Up Recommendations  Follow surgeon's recommendation for DC plan and follow-up therapies     Equipment Recommendations  (All DME has been delivered to the home (RW, BSC, shower chair and CPM).)    Recommendations for Other Services       Precautions / Restrictions Precautions Precautions: Knee Precaution Comments: Reviewed no pillow under knee. Required Braces or Orthoses: Knee Immobilizer - Right Knee Immobilizer - Right: On when out of bed or walking Restrictions RLE Weight Bearing: Weight bearing as tolerated    Mobility  Bed Mobility   Bed Mobility: Sit to Supine       Sit to supine: Min assist   General bed mobility comments: verbal cues for sequencing, assist with RLE  Transfers Overall transfer level: Needs assistance Equipment used: Rolling walker (2 wheeled)   Sit to Stand: Min guard         General transfer comment: verbal cues for hand placement  Ambulation/Gait Ambulation/Gait assistance: Min guard Ambulation Distance (Feet): 175 Feet Assistive device: Rolling walker (2 wheeled) Gait Pattern/deviations: Step-through pattern;Decreased stride length;Antalgic Gait velocity: decreased Gait velocity interpretation: Below normal speed for age/gender General Gait Details: verbal cues for sequencing   Stairs            Wheelchair Mobility    Modified Rankin (Stroke Patients Only)       Balance                                             Cognition Arousal/Alertness: Awake/alert Behavior During Therapy: WFL for tasks assessed/performed Overall Cognitive Status: Within Functional Limits for tasks assessed                                        Exercises Total Joint Exercises Ankle Circles/Pumps: AROM;10 reps;Both Quad Sets: AROM;Right;10 reps Short Arc Quad: AROM;Right;10 reps Heel Slides: AROM;10 reps;Right Hip ABduction/ADduction: AROM;10 reps;Right Goniometric ROM: 0-50 R knee    General Comments        Pertinent Vitals/Pain Pain Assessment: 0-10 Pain Score: 3  Pain Location: R knee Pain Descriptors / Indicators: Sore Pain Intervention(s): Monitored during session;Repositioned    Home Living                      Prior Function            PT Goals (current goals can now be found in the care plan section) Acute Rehab PT Goals Patient Stated Goal: home tomorrow PT Goal Formulation: With patient Time For Goal Achievement: 12/31/17 Potential to Achieve Goals: Good Progress towards PT goals: Progressing toward goals    Frequency    7X/week      PT Plan Current plan remains appropriate    Co-evaluation  AM-PAC PT "6 Clicks" Daily Activity  Outcome Measure  Difficulty turning over in bed (including adjusting bedclothes, sheets and blankets)?: A Little Difficulty moving from lying on back to sitting on the side of the bed? : A Lot Difficulty sitting down on and standing up from a chair with arms (e.g., wheelchair, bedside commode, etc,.)?: A Little Help needed moving to and from a bed to chair (including a wheelchair)?: A Little Help needed walking in hospital room?: A Little Help needed climbing 3-5 steps with a railing? : A Little 6 Click Score: 17    End of Session Equipment Utilized During Treatment: Gait belt;Right knee immobilizer Activity Tolerance: Patient tolerated treatment well Patient left: in bed;with call bell/phone within reach;with  family/visitor present Nurse Communication: Mobility status PT Visit Diagnosis: Other abnormalities of gait and mobility (R26.89);Difficulty in walking, not elsewhere classified (R26.2);Pain Pain - Right/Left: Right Pain - part of body: Knee     Time: 0076-2263 PT Time Calculation (min) (ACUTE ONLY): 25 min  Charges:  $Gait Training: 8-22 mins $Therapeutic Exercise: 8-22 mins                    G Codes:       Lorrin Goodell, PT  Office # [phone] Pager [phone]    Lorriane Shire 12/17/2017, 3:12 PM

## 2017-12-18 LAB — CBC
HCT: 26.1 % — ABNORMAL LOW (ref 36.0–46.0)
Hemoglobin: 8.5 g/dL — ABNORMAL LOW (ref 12.0–15.0)
MCH: 32.1 pg (ref 26.0–34.0)
MCHC: 32.6 g/dL (ref 30.0–36.0)
MCV: 98.5 fL (ref 78.0–100.0)
PLATELETS: 201 10*3/uL (ref 150–400)
RBC: 2.65 MIL/uL — ABNORMAL LOW (ref 3.87–5.11)
RDW: 13.9 % (ref 11.5–15.5)
WBC: 7.9 10*3/uL (ref 4.0–10.5)

## 2017-12-18 NOTE — Plan of Care (Signed)
Plan of care discussed with pt. Any questions or concerns answered.

## 2017-12-18 NOTE — Care Management Note (Signed)
Case Management Note  Patient Details  Name: Melody Moore MRN: [id number] Date of Birth: 11/03/1950  Subjective/Objective:  Right TKA                  Action/Plan: NCM spoke to pt and offered choice for HH/list provided. Pt agreeable to [facility] for HHPT. Pt states she has RW and 3n1 bedside commode at home.    Expected Discharge Date:  12/18/17               Expected Discharge Plan:  [place]  In-House Referral:  NA  Discharge planning Services  CM Consult  Post Acute Care Choice:  Home Health Choice offered to:  Patient  DME Arranged:  N/A DME Agency:  NA  HH Arranged:  PT [place] Agency:  [facility] (formerly [employer])  Status of Service:  Completed, signed off  If discussed at [employer], dates discussed:    Additional Comments:  Erenest Rasher, RN 12/18/2017, 10:01 AM

## 2017-12-18 NOTE — Progress Notes (Signed)
PATIENT ID: Melody Moore  MRN: [id number]  DOB/AGE:  01/19/1951 / 67 y.o.  2 Days Post-Op Procedure(s) (LRB): RIGHT TOTAL KNEE ARTHROPLASTY (Right)    PROGRESS NOTE Subjective: Patient is alert, oriented, no Nausea, no Vomiting, yes passing gas. Taking PO well. Denies SOB, Chest or Calf Pain. Using Incentive Spirometer, PAS in place. Ambulate WBAT with pt walking 180 ft with therapy, Patient reports pain as 4/10 .    Objective: Vital signs in last 24 hours: Vitals:   12/17/17 1346 12/17/17 2024 12/18/17 0514 12/18/17 0853  BP: (!) 107/50 (!) 123/47 (!) 109/42 (!) 98/51  Pulse: 75 95 (!) 108 97  Resp: 16 15 14    Temp: (!) 97.4 F (36.3 C) 98.4 F (36.9 C) 98.2 F (36.8 C)   TempSrc: Oral Oral Oral   SpO2: 92% 100% 91%   Weight:      Height:          Intake/Output from previous day: I/O last 3 completed shifts: In: 0160 [P.O.:1265; I.V.:3116; IV Piggyback:100] Out: 1850 [Urine:1850]   Intake/Output this shift: No intake/output data recorded.   LABORATORY DATA: Recent Labs    12/17/17 0601 12/18/17 0545  WBC 8.9 7.9  HGB 9.3* 8.5*  HCT 28.1* 26.1*  PLT 215 201  NA 137  --   K 4.2  --   CL 104  --   CO2 26  --   BUN 13  --   CREATININE 1.01*  --   GLUCOSE 122*  --   CALCIUM 8.8*  --     Examination: Neurologically intact Neurovascular intact Sensation intact distally Intact pulses distally Dorsiflexion/Plantar flexion intact Incision: dressing C/D/I No cellulitis present Compartment soft}  Assessment:   2 Days Post-Op Procedure(s) (LRB): RIGHT TOTAL KNEE ARTHROPLASTY (Right) ADDITIONAL DIAGNOSIS: Expected Acute Blood Loss Anemia, Hypertension and Renal Insufficiency Chronic  Plan: PT/OT WBAT, AROM and PROM  DVT Prophylaxis:  SCDx72hrs, ASA 325 mg BID x 2 weeks DISCHARGE PLAN: Home DISCHARGE NEEDS: HHPT, Walker and 3-in-1 comode seat     Joanell Rising 12/18/2017, 9:01 AM

## 2017-12-18 NOTE — Progress Notes (Signed)
Physical Therapy Treatment Patient Details Name: Melody Moore MRN: [id number] DOB: November 08, 1950 Today's Date: 12/18/2017    History of Present Illness Pt is a 67 y.o. female s/p R TKA.     PT Comments    Pt is progressing well with mobility, she ambulated 180' with RW and demonstrates good understanding of HEP. She is ready to DC home from PT standpoint.   Follow Up Recommendations  Follow surgeon's recommendation for DC plan and follow-up therapies     Equipment Recommendations  (All DME has been delivered to the home (RW, BSC, shower chair and CPM).)    Recommendations for Other Services       Precautions / Restrictions Precautions Precautions: Knee Precaution Comments: Reviewed no pillow under knee. Required Braces or Orthoses: Knee Immobilizer - Right Knee Immobilizer - Right: On when out of bed or walking Restrictions RLE Weight Bearing: Weight bearing as tolerated    Mobility  Bed Mobility Overal bed mobility: Needs Assistance Bed Mobility: Supine to Sit     Supine to sit: Min assist     General bed mobility comments: verbal cues for sequencing, assist with RLE, instructed pt to self assist RLE with LLE  Transfers Overall transfer level: Needs assistance Equipment used: Rolling walker (2 wheeled) Transfers: Sit to/from Stand Sit to Stand: Min assist         General transfer comment: verbal cues for hand placement, assist to power up, increased time to stabilize initial standing balance  Ambulation/Gait Ambulation/Gait assistance: Min guard Ambulation Distance (Feet): 180 Feet Assistive device: Rolling walker (2 wheeled) Gait Pattern/deviations: Step-to pattern;Antalgic;Decreased stride length Gait velocity: decreased Gait velocity interpretation: Below normal speed for age/gender General Gait Details: good sequencing, no loss of balance   Instructed spouse in donning knee immobilizer   Stairs- pt has no stairs            Wheelchair  Mobility    Modified Rankin (Stroke Patients Only)       Balance Overall balance assessment: Needs assistance Sitting-balance support: No upper extremity supported;Feet supported Sitting balance-Leahy Scale: Good     Standing balance support: Bilateral upper extremity supported;During functional activity Standing balance-Leahy Scale: Poor                              Cognition Arousal/Alertness: Awake/alert Behavior During Therapy: WFL for tasks assessed/performed Overall Cognitive Status: Within Functional Limits for tasks assessed                                        Exercises Total Joint Exercises Ankle Circles/Pumps: AROM;10 reps;Both Quad Sets: AROM;Right;10 reps Short Arc Quad: Right;10 reps;AAROM;Supine Heel Slides: 10 reps;Right;AAROM;Supine Hip ABduction/ADduction: 10 reps;Right;AAROM;Supine Straight Leg Raises: AAROM;Right;10 reps;Supine Long Arc Quad: AAROM;Right;10 reps;Seated Knee Flexion: AAROM;Right;10 reps;Seated Goniometric ROM: 0-60* AAROM R knee    General Comments        Pertinent Vitals/Pain Pain Score: 3  Pain Location: R knee Pain Descriptors / Indicators: Sore Pain Intervention(s): Limited activity within patient's tolerance;Monitored during session;Premedicated before session;Ice applied    Home Living                      Prior Function            PT Goals (current goals can now be found in the care plan section) Acute Rehab PT Goals  Patient Stated Goal: to go home, be able to walk PT Goal Formulation: With patient/family Time For Goal Achievement: 12/31/17 Potential to Achieve Goals: Good Progress towards PT goals: Progressing toward goals    Frequency    7X/week      PT Plan Current plan remains appropriate    Co-evaluation              AM-PAC PT "6 Clicks" Daily Activity  Outcome Measure  Difficulty turning over in bed (including adjusting bedclothes, sheets and  blankets)?: A Little Difficulty moving from lying on back to sitting on the side of the bed? : Unable Difficulty sitting down on and standing up from a chair with arms (e.g., wheelchair, bedside commode, etc,.)?: Unable Help needed moving to and from a bed to chair (including a wheelchair)?: A Little Help needed walking in hospital room?: A Little Help needed climbing 3-5 steps with a railing? : A Little 6 Click Score: 14    End of Session Equipment Utilized During Treatment: Gait belt;Right knee immobilizer Activity Tolerance: Patient tolerated treatment well Patient left: with call bell/phone within reach;with family/visitor present;in chair;with nursing/sitter in room Nurse Communication: Mobility status PT Visit Diagnosis: Other abnormalities of gait and mobility (R26.89);Difficulty in walking, not elsewhere classified (R26.2);Pain Pain - Right/Left: Right Pain - part of body: Knee     Time: 2035-5974 PT Time Calculation (min) (ACUTE ONLY): 34 min  Charges:  $Gait Training: 8-22 mins $Therapeutic Exercise: 8-22 mins                    G Codes:         Melody Moore 12/18/2017, 8:59 AM [phone]

## 2017-12-18 NOTE — Discharge Summary (Signed)
Patient ID: Melody Moore MRN: [id number] DOB/AGE: 69-Jun-1952 69 y.o.  Admit date: 12/16/2017 Discharge date: 12/18/2017  Admission Diagnoses:  Principal Problem:   Primary osteoarthritis of right knee   Discharge Diagnoses:  Same  Past Medical History:  Diagnosis Date  . 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 AKI (acute kidney injury) ([place]) 02/2016  . Allergy   . Arthritis   . Carotid stenosis, asymptomatic, bilateral 05/26/2017   Dopplers 02/12/2016: right - mid-distal 40-59% stenosis; left - 40-59% stenosis  . Cataract   . CKD (chronic kidney disease) stage 3, GFR 30-59 ml/min (HCC) 11/02/2017  . Decreased calculated GFR 06/15/2017  . Gallstones 06/22/2017  . History of echocardiogram 11/07/2017   04/09/2016: EF 71%, mild diastolic dysfunction, trivial MR and TR  . Hypertension   . Hypothyroidism   . 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 Rheumatoid arthritis ([place])   . Rheumatoid arthritis involving multiple sites ([place]) 05/26/2017  . Transaminitis 06/15/2017   Normal abdominal US 06/22/17    Surgeries: Procedure(s): RIGHT TOTAL KNEE ARTHROPLASTY on 12/16/2017   Consultants:   Discharged Condition: Improved  Hospital Course: Kaveri Perras is an 67 y.o. female who was admitted 12/16/2017 for operative treatment ofPrimary osteoarthritis of right knee. Patient has severe unremitting pain that affects sleep, daily activities, and work/hobbies. After pre-op clearance the patient was taken to the operating room on 12/16/2017 and underwent  Procedure(s): RIGHT TOTAL KNEE ARTHROPLASTY.    Patient was given perioperative antibiotics:  Anti-infectives (From admission, onward)   Start     Dose/Rate Route Frequency Ordered Stop   12/16/17 2200  hydroxychloroquine (PLAQUENIL) tablet 200 mg     200 mg Oral 2 times daily 12/16/17 1109     12/16/17 1400  ceFAZolin (ANCEF) IVPB 2g/100 mL premix     2 g 200 mL/hr over 30 Minutes Intravenous Every 6 hours 12/16/17 1109 12/16/17  2015   12/16/17 0536  ceFAZolin (ANCEF) IVPB 2g/100 mL premix     2 g 200 mL/hr over 30 Minutes Intravenous On call to O.R. 12/16/17 0536 12/16/17 2458       Patient was given sequential compression devices, early ambulation, and chemoprophylaxis to prevent DVT.  Patient benefited maximally from hospital stay and there were no complications.    Recent vital signs:  Patient Vitals for the past 24 hrs:  BP Temp Temp src Pulse Resp SpO2  12/18/17 0853 (!) 98/51 - - 97 - -  12/18/17 0514 (!) 109/42 98.2 F (36.8 C) Oral (!) 108 14 91 %  12/17/17 2024 (!) 123/47 98.4 F (36.9 C) Oral 95 15 100 %  12/17/17 1346 (!) 107/50 (!) 97.4 F (36.3 C) Oral 75 16 92 %     Recent laboratory studies:  Recent Labs    12/17/17 0601 12/18/17 0545  WBC 8.9 7.9  HGB 9.3* 8.5*  HCT 28.1* 26.1*  PLT 215 201  NA 137  --   K 4.2  --   CL 104  --   CO2 26  --   BUN 13  --   CREATININE 1.01*  --   GLUCOSE 122*  --   CALCIUM 8.8*  --      Discharge Medications:   Allergies as of 12/18/2017      Reactions   Bee Venom Anaphylaxis   Oxycodone Other (See Comments)   Adrenal crisis while taking both prednisone and oxycodone    Prednisone    Adrenal crisis while taking both prednisone and oxycodone  Verapamil Hives   Nickel Rash   Nitrofuran Derivatives Rash      Medication List    STOP taking these medications   methotrexate 2.5 MG tablet Commonly known as:  RHEUMATREX   traMADol 50 MG tablet Commonly known as:  ULTRAM     TAKE these medications   acetaminophen 650 MG CR tablet Commonly known as:  TYLENOL Take 2 tablets (1,300 mg total) by mouth every 8 (eight) hours as needed for pain. What changed:  when to take this   amLODipine 5 MG tablet Commonly known as:  NORVASC Take 1 tablet (5 mg total) by mouth daily.   aspirin EC 325 MG tablet Take 1 tablet (325 mg total) by mouth 2 (two) times daily after a meal. Take x 1 month post op to decrease risk of blood clots. What  changed:    medication strength  how much to take  when to take this  additional instructions   atorvastatin 40 MG tablet Commonly known as:  LIPITOR Take 40 mg by mouth at bedtime.   CALCIUM 600 + D PO Take 2 tablets by mouth daily.   cetirizine 10 MG tablet Commonly known as:  ZYRTEC Take 1 tablet (10 mg total) by mouth daily. What changed:    when to take this  reasons to take this   docusate sodium 100 MG capsule Commonly known as:  COLACE Take 100 mg by mouth 2 (two) times daily.   EPINEPHrine 0.3 mg/0.3 mL Soaj injection Commonly known as:  EPIPEN 2-PAK Inject 0.3 mLs (0.3 mg total) into the muscle once as needed. What changed:  reasons to take this   ferrous sulfate 325 (65 FE) MG tablet Take 325 mg by mouth 2 (two) times daily.   fluticasone 50 MCG/ACT nasal spray Commonly known as:  FLONASE Place 1 spray into both nostrils daily. What changed:    when to take this  reasons to take this   gabapentin 300 MG capsule Commonly known as:  NEURONTIN TAKE 1 CAPSULE BY MOUTH EVERYDAY AT BEDTIME   HYDROcodone-acetaminophen 10-325 MG tablet Commonly known as:  NORCO Take 1-2 tablets by mouth every 6 (six) hours as needed for severe pain.   hydroxychloroquine 200 MG tablet Commonly known as:  PLAQUENIL Take 200 mg by mouth 2 (two) times daily.   leucovorin 5 MG tablet Commonly known as:  WELLCOVORIN Take 5 mg by mouth every Monday. The day after taking methorexate   levothyroxine 50 MCG tablet Commonly known as:  SYNTHROID, LEVOTHROID TAKE 1 TABLET BY MOUTH EVERY DAY IN THE MORNING   Olopatadine HCl 0.2 % Soln Apply 1 drop to eye daily. What changed:    how to take this  when to take this  reasons to take this   quinapril 40 MG tablet Commonly known as:  ACCUPRIL Take 1 tablet (40 mg total) by mouth daily.   tiZANidine 2 MG tablet Commonly known as:  ZANAFLEX Take 1 tablet (2 mg total) by mouth every 8 (eight) hours as needed for muscle  spasms.   traZODone 100 MG tablet Commonly known as:  DESYREL Take 100 mg by mouth at bedtime.   Vitamin D 2000 units tablet Take 2,000 Units by mouth daily.            Durable Medical Equipment  (From admission, onward)        Start     Ordered   12/17/17 1032  For home use only DME Walker rolling  Once  Question:  Patient needs a walker to treat with the following condition  Answer:  Surgery, elective   12/17/17 1031   12/17/17 1032  For home use only DME 3 n 1  Once     12/17/17 1031      Diagnostic Studies: Dg Chest 2 View  Result Date: 12/09/2017 CLINICAL DATA:  Preop examination. EXAM: CHEST - 2 VIEW COMPARISON:  None. FINDINGS: The heart size and mediastinal contours are within normal limits. Both lungs are clear. The visualized skeletal structures are unremarkable. IMPRESSION: No active cardiopulmonary disease. Electronically Signed   By: Kathreen Devoid   On: 12/09/2017 14:37    Disposition: Discharge disposition: 01-Home or Self Care       Discharge Instructions    CPM   Complete by:  As directed    Continuous passive motion machine (CPM):      Use the CPM from 0 to 60  for 5 hours per day.      You may increase by 10 degrees per day.  You may break it up into 2 or 3 sessions per day.      Use CPM for 2 weeks or until you are told to stop.   Call MD / Call 911   Complete by:  As directed    If you experience chest pain or shortness of breath, CALL 911 and be transported to the hospital emergency room.  If you develope a fever above 101 F, pus (white drainage) or increased drainage or redness at the wound, or calf pain, call your surgeon's office.   Constipation Prevention   Complete by:  As directed    Drink plenty of fluids.  Prune juice may be helpful.  You may use a stool softener, such as Colace (over the counter) 100 mg twice a day.  Use MiraLax (over the counter) for constipation as needed.   Diet - low sodium heart healthy   Complete by:  As  directed    Driving restrictions   Complete by:  As directed    No driving for 2 weeks   Increase activity slowly as tolerated   Complete by:  As directed    Patient may shower   Complete by:  As directed    You may shower without a dressing once there is no drainage.  Do not wash over the wound.  If drainage remains, cover wound with plastic wrap and then shower.      Follow-up Information    Dorna Leitz, MD. Schedule an appointment as soon as possible for a visit in 2 week(s).   Specialty:  Orthopedic Surgery Contact information: [facility] [address] [phone]            Signed: Joanell Rising 12/18/2017, 9:03 AM

## 2017-12-20 DIAGNOSIS — N183 Chronic kidney disease, stage 3 (moderate): Secondary | ICD-10-CM | POA: Diagnosis not present

## 2017-12-20 DIAGNOSIS — I129 Hypertensive chronic kidney disease with stage 1 through stage 4 chronic kidney disease, or unspecified chronic kidney disease: Secondary | ICD-10-CM | POA: Diagnosis not present

## 2017-12-20 DIAGNOSIS — M069 Rheumatoid arthritis, unspecified: Secondary | ICD-10-CM | POA: Diagnosis not present

## 2017-12-20 DIAGNOSIS — Z471 Aftercare following joint replacement surgery: Secondary | ICD-10-CM | POA: Diagnosis not present

## 2017-12-20 DIAGNOSIS — I6523 Occlusion and stenosis of bilateral carotid arteries: Secondary | ICD-10-CM | POA: Diagnosis not present

## 2017-12-20 DIAGNOSIS — Z96653 Presence of artificial knee joint, bilateral: Secondary | ICD-10-CM | POA: Diagnosis not present

## 2017-12-22 DIAGNOSIS — Z96653 Presence of artificial knee joint, bilateral: Secondary | ICD-10-CM | POA: Diagnosis not present

## 2017-12-22 DIAGNOSIS — N183 Chronic kidney disease, stage 3 (moderate): Secondary | ICD-10-CM | POA: Diagnosis not present

## 2017-12-22 DIAGNOSIS — I129 Hypertensive chronic kidney disease with stage 1 through stage 4 chronic kidney disease, or unspecified chronic kidney disease: Secondary | ICD-10-CM | POA: Diagnosis not present

## 2017-12-22 DIAGNOSIS — I6523 Occlusion and stenosis of bilateral carotid arteries: Secondary | ICD-10-CM | POA: Diagnosis not present

## 2017-12-22 DIAGNOSIS — Z471 Aftercare following joint replacement surgery: Secondary | ICD-10-CM | POA: Diagnosis not present

## 2017-12-22 DIAGNOSIS — M069 Rheumatoid arthritis, unspecified: Secondary | ICD-10-CM | POA: Diagnosis not present

## 2017-12-23 ENCOUNTER — Other Ambulatory Visit: Payer: Self-pay

## 2017-12-23 NOTE — Patient Outreach (Signed)
[facility]) Care Management  12/23/2017  Melody Moore [birth year] [id number]  Transition of care  Referral date: 12/21/17 Referral source: discharged from [facility] long hospital on 12/18/17 Insurance: [employer]  Telephone call to patient regarding  Transition of care follow up. HIPAA verified with patient. Discussed transition of care follow up. Patient verbally agreed. Patient states she is doing well. Patient states she has a follow up visit with her surgeon on 12/29/17. Patient states she does not have a follow up with her primary MD. RNCM advised patient to call her primary MD to schedule follow up appointment.  Patient states [facility] is providing home care physical therapy 3 times per week. Patient states she will have outpatient therapy after home therapy is complete. Patient reports she is taking her medication as prescribed. Describes her pain level as a 5 today.  Patient denies any unusual redness, swelling, drainage at her incision site.  Denies fever. RNCM reviewed sign/ symptoms of infection with patient. Advised to call her doctor for symptoms.  Patient states she has good support from her husband. Husband provides transportation to her appointments.  RNCM advised patient to notify MD of any changes in condition prior to scheduled appointment. Patient requested RNCM send text with contact name and number: [phone] or main office number [phone] and 24 hour nurse advise line [phone].  RNCM verified patient aware of 911 services for urgent/ emergent needs.,   PLAN: RNCM will follow up with patient within 1 week.  RNCm will send patient [facility] care management welcome packet/ consent form RNCM will send patients primary MD involvement letter.   Quinn Plowman RN,BSN,CCM [facility] Telephonic  [phone]

## 2017-12-26 DIAGNOSIS — I129 Hypertensive chronic kidney disease with stage 1 through stage 4 chronic kidney disease, or unspecified chronic kidney disease: Secondary | ICD-10-CM | POA: Diagnosis not present

## 2017-12-26 DIAGNOSIS — I6523 Occlusion and stenosis of bilateral carotid arteries: Secondary | ICD-10-CM | POA: Diagnosis not present

## 2017-12-26 DIAGNOSIS — Z96653 Presence of artificial knee joint, bilateral: Secondary | ICD-10-CM | POA: Diagnosis not present

## 2017-12-26 DIAGNOSIS — M069 Rheumatoid arthritis, unspecified: Secondary | ICD-10-CM | POA: Diagnosis not present

## 2017-12-26 DIAGNOSIS — Z471 Aftercare following joint replacement surgery: Secondary | ICD-10-CM | POA: Diagnosis not present

## 2017-12-26 DIAGNOSIS — N183 Chronic kidney disease, stage 3 (moderate): Secondary | ICD-10-CM | POA: Diagnosis not present

## 2017-12-28 DIAGNOSIS — Z96653 Presence of artificial knee joint, bilateral: Secondary | ICD-10-CM | POA: Diagnosis not present

## 2017-12-28 DIAGNOSIS — I129 Hypertensive chronic kidney disease with stage 1 through stage 4 chronic kidney disease, or unspecified chronic kidney disease: Secondary | ICD-10-CM | POA: Diagnosis not present

## 2017-12-28 DIAGNOSIS — N183 Chronic kidney disease, stage 3 (moderate): Secondary | ICD-10-CM | POA: Diagnosis not present

## 2017-12-28 DIAGNOSIS — M069 Rheumatoid arthritis, unspecified: Secondary | ICD-10-CM | POA: Diagnosis not present

## 2017-12-28 DIAGNOSIS — I6523 Occlusion and stenosis of bilateral carotid arteries: Secondary | ICD-10-CM | POA: Diagnosis not present

## 2017-12-28 DIAGNOSIS — Z471 Aftercare following joint replacement surgery: Secondary | ICD-10-CM | POA: Diagnosis not present

## 2017-12-29 ENCOUNTER — Other Ambulatory Visit: Payer: Self-pay

## 2017-12-29 DIAGNOSIS — M1711 Unilateral primary osteoarthritis, right knee: Secondary | ICD-10-CM | POA: Diagnosis not present

## 2017-12-29 DIAGNOSIS — Z471 Aftercare following joint replacement surgery: Secondary | ICD-10-CM | POA: Diagnosis not present

## 2017-12-29 DIAGNOSIS — Z96651 Presence of right artificial knee joint: Secondary | ICD-10-CM | POA: Diagnosis not present

## 2017-12-29 NOTE — Patient Outreach (Signed)
[place] [facility]) Care Management  12/29/2017  Valia Wingard March 15, 1951 [id number]   Initial assessment / Transition of care  Referral date: 12/21/17 Referral source: discharged from [facility] hospital on 12/18/17 Insurance: [employer] Attempt #1  Telephone call to patient regarding transition of care follow up. Unable to reach patient. HIPAA compliant voice message left with call back phone number.   PLAN: RNCM will attempt 2nd telephone call to patient within 4 business days.  RNCM will send outreach letter to attempt contact  Quinn Plowman RN,BSN,CCM [facility] Telephonic  [phone]

## 2017-12-30 ENCOUNTER — Other Ambulatory Visit: Payer: Self-pay

## 2017-12-30 DIAGNOSIS — I129 Hypertensive chronic kidney disease with stage 1 through stage 4 chronic kidney disease, or unspecified chronic kidney disease: Secondary | ICD-10-CM | POA: Diagnosis not present

## 2017-12-30 DIAGNOSIS — Z96653 Presence of artificial knee joint, bilateral: Secondary | ICD-10-CM | POA: Diagnosis not present

## 2017-12-30 DIAGNOSIS — N183 Chronic kidney disease, stage 3 (moderate): Secondary | ICD-10-CM | POA: Diagnosis not present

## 2017-12-30 DIAGNOSIS — M069 Rheumatoid arthritis, unspecified: Secondary | ICD-10-CM | POA: Diagnosis not present

## 2017-12-30 DIAGNOSIS — I6523 Occlusion and stenosis of bilateral carotid arteries: Secondary | ICD-10-CM | POA: Diagnosis not present

## 2017-12-30 DIAGNOSIS — Z471 Aftercare following joint replacement surgery: Secondary | ICD-10-CM | POA: Diagnosis not present

## 2017-12-30 MED ORDER — ATORVASTATIN CALCIUM 40 MG PO TABS
40.0000 mg | ORAL_TABLET | Freq: Every day | ORAL | 0 refills | Status: DC
Start: 2017-12-30 — End: 2018-03-28

## 2018-01-02 ENCOUNTER — Other Ambulatory Visit: Payer: Self-pay

## 2018-01-02 DIAGNOSIS — Z96653 Presence of artificial knee joint, bilateral: Secondary | ICD-10-CM | POA: Diagnosis not present

## 2018-01-02 DIAGNOSIS — N183 Chronic kidney disease, stage 3 (moderate): Secondary | ICD-10-CM | POA: Diagnosis not present

## 2018-01-02 DIAGNOSIS — M069 Rheumatoid arthritis, unspecified: Secondary | ICD-10-CM | POA: Diagnosis not present

## 2018-01-02 DIAGNOSIS — Z471 Aftercare following joint replacement surgery: Secondary | ICD-10-CM | POA: Diagnosis not present

## 2018-01-02 DIAGNOSIS — I6523 Occlusion and stenosis of bilateral carotid arteries: Secondary | ICD-10-CM | POA: Diagnosis not present

## 2018-01-02 DIAGNOSIS — I129 Hypertensive chronic kidney disease with stage 1 through stage 4 chronic kidney disease, or unspecified chronic kidney disease: Secondary | ICD-10-CM | POA: Diagnosis not present

## 2018-01-02 NOTE — Patient Outreach (Signed)
[place] [facility]) Care Management  01/02/2018  Melody Moore 05/04/1951 [id number]  Initial assessment / Transition of care  Referral date:12/21/17 Referral source:discharged from [facility] long hospital on 12/18/17 Insurance:[employer]  Telephone call to patient regarding transition of care follow up. Unable to reach patient. HIPAA compliant voice message left with call back phone number.   Received incoming call from patient.  HIPAA verified.  Patient request call back on another day due to home health arriving.   PLAN; RNCM will attempt telephone call to patient within 4 business days.   Quinn Plowman RN,BSN,CCM [facility] Telephonic  [phone]

## 2018-01-04 ENCOUNTER — Encounter: Payer: Self-pay | Admitting: Physician Assistant

## 2018-01-04 ENCOUNTER — Ambulatory Visit (INDEPENDENT_AMBULATORY_CARE_PROVIDER_SITE_OTHER): Payer: Medicare HMO | Admitting: Physician Assistant

## 2018-01-04 VITALS — BP 125/76 | HR 70 | Wt 190.0 lb

## 2018-01-04 DIAGNOSIS — M069 Rheumatoid arthritis, unspecified: Secondary | ICD-10-CM | POA: Diagnosis not present

## 2018-01-04 DIAGNOSIS — Z96653 Presence of artificial knee joint, bilateral: Secondary | ICD-10-CM | POA: Diagnosis not present

## 2018-01-04 DIAGNOSIS — Z09 Encounter for follow-up examination after completed treatment for conditions other than malignant neoplasm: Secondary | ICD-10-CM

## 2018-01-04 DIAGNOSIS — N183 Chronic kidney disease, stage 3 (moderate): Secondary | ICD-10-CM | POA: Diagnosis not present

## 2018-01-04 DIAGNOSIS — Z471 Aftercare following joint replacement surgery: Secondary | ICD-10-CM | POA: Diagnosis not present

## 2018-01-04 DIAGNOSIS — I129 Hypertensive chronic kidney disease with stage 1 through stage 4 chronic kidney disease, or unspecified chronic kidney disease: Secondary | ICD-10-CM | POA: Diagnosis not present

## 2018-01-04 DIAGNOSIS — I6523 Occlusion and stenosis of bilateral carotid arteries: Secondary | ICD-10-CM | POA: Diagnosis not present

## 2018-01-04 DIAGNOSIS — S90821A Blister (nonthermal), right foot, initial encounter: Secondary | ICD-10-CM | POA: Diagnosis not present

## 2018-01-04 NOTE — Progress Notes (Signed)
HPI:                                                                Melody Moore is a 67 y.o. female who presents to [place]: [place] today for hospital follow-up  Melody Moore is a pleasant 67 yo F who is approximately 3 weeks s/p right TKR. She reports no post-op complications. Denies fever, chills, fatigue, joint redness, warmth or drainage. Endorses some swelling of her right knee and pain that is worse in the evenings, but well-controlled with Hydrocodone as needed.   She reports a blister on her right heel that developed during PT while performing dorsiflexion exercises against a bedsheet. She had to stop the exercises due to heel pain. Has tried bandaging the area herself, but it is tender with weight bearing.  No flowsheet data found.    Past Medical History:  Diagnosis Date  . 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 AKI (acute kidney injury) ([place]) 02/2016  . Allergy   . Arthritis   . Carotid stenosis, asymptomatic, bilateral 05/26/2017   Dopplers 02/12/2016: right - mid-distal 40-59% stenosis; left - 40-59% stenosis  . Cataract   . CKD (chronic kidney disease) stage 3, GFR 30-59 ml/min (HCC) 11/02/2017  . Decreased calculated GFR 06/15/2017  . Gallstones 06/22/2017  . History of echocardiogram 11/07/2017   04/09/2016: EF 02%, mild diastolic dysfunction, trivial MR and TR  . Hypertension   . Hypothyroidism   . 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 Rheumatoid arthritis ([place])   . Rheumatoid arthritis involving multiple sites ([place]) 05/26/2017  . Transaminitis 06/15/2017   Normal abdominal US 06/22/17   Past Surgical History:  Procedure Laterality Date  . APPENDECTOMY    . CATARACT EXTRACTION, BILATERAL  2015  . 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  . TOTAL KNEE ARTHROPLASTY Right 12/16/2017   Procedure: RIGHT TOTAL KNEE ARTHROPLASTY;  Surgeon: Dorna Leitz, MD;  Location: [facility] ORS;  Service: Orthopedics;  Laterality: Right;  Adductor  Block   Social History   Tobacco Use  . Smoking status: Former Smoker    Types: Cigarettes    Last attempt to quit: 02/07/1974    Years since quitting: 43.9  . Smokeless tobacco: Never Used  Substance Use Topics  . Alcohol use: Yes    Alcohol/week: 0.6 oz    Types: 1 Glasses of wine per week   family history includes Diabetes in her mother; Hypertension in her father; Lung cancer in her father.    ROS: negative except as noted in the HPI  Medications: Current Outpatient Medications  Medication Sig Dispense Refill  . methotrexate (RHEUMATREX) 5 MG tablet Take 5 mg by mouth once a week. Caution: Chemotherapy. Protect from light.    Marland Kitchen acetaminophen (TYLENOL) 650 MG CR tablet Take 2 tablets (1,300 mg total) by mouth every 8 (eight) hours as needed for pain. (Patient taking differently: Take 1,300 mg by mouth 2 (two) times daily as needed for pain. ) 90 tablet 3  . amLODipine (NORVASC) 5 MG tablet Take 1 tablet (5 mg total) by mouth daily. 30 tablet 5  . aspirin EC 325 MG tablet Take 1 tablet (325 mg total) by mouth 2 (two) times daily after a meal. Take x 1 month post  op to decrease risk of blood clots. 60 tablet 0  . atorvastatin (LIPITOR) 40 MG tablet Take 1 tablet (40 mg total) by mouth at bedtime. 90 tablet 0  . Calcium Carb-Cholecalciferol (CALCIUM 600 + D PO) Take 2 tablets by mouth daily.    . cetirizine (ZYRTEC) 10 MG tablet Take 1 tablet (10 mg total) by mouth daily. (Patient taking differently: Take 10 mg by mouth daily as needed for allergies. ) 30 tablet 11  . Cholecalciferol (VITAMIN D) 2000 units tablet Take 2,000 Units by mouth daily.    Marland Kitchen docusate sodium (COLACE) 100 MG capsule Take 100 mg by mouth 2 (two) times daily.    Marland Kitchen EPINEPHrine (EPIPEN 2-PAK) 0.3 mg/0.3 mL IJ SOAJ injection Inject 0.3 mLs (0.3 mg total) into the muscle once as needed. (Patient taking differently: Inject 0.3 mg into the muscle once as needed (bee stings). ) 2 Device 1  . ferrous sulfate 325 (65 FE)  MG tablet Take 325 mg by mouth 2 (two) times daily.    . fluticasone (FLONASE) 50 MCG/ACT nasal spray Place 1 spray into both nostrils daily. (Patient taking differently: Place 1 spray into both nostrils daily as needed for allergies. ) 16 g 1  . gabapentin (NEURONTIN) 300 MG capsule TAKE 1 CAPSULE BY MOUTH EVERYDAY AT BEDTIME 90 capsule 0  . HYDROcodone-acetaminophen (NORCO) 10-325 MG tablet Take 1-2 tablets by mouth every 6 (six) hours as needed for severe pain. 50 tablet 0  . hydroxychloroquine (PLAQUENIL) 200 MG tablet Take 200 mg by mouth 2 (two) times daily.     Marland Kitchen leucovorin (WELLCOVORIN) 5 MG tablet Take 5 mg by mouth every Monday. The day after taking methorexate    . levothyroxine (SYNTHROID, LEVOTHROID) 50 MCG tablet TAKE 1 TABLET BY MOUTH EVERY DAY IN THE MORNING 90 tablet 0  . Olopatadine HCl 0.2 % SOLN Apply 1 drop to eye daily. (Patient taking differently: Place 1 drop into both eyes daily as needed (allergies). ) 1 Bottle 0  . ondansetron (ZOFRAN) 4 MG tablet     . quinapril (ACCUPRIL) 40 MG tablet Take 1 tablet (40 mg total) by mouth daily. 90 tablet 0  . tiZANidine (ZANAFLEX) 2 MG tablet Take 1 tablet (2 mg total) by mouth every 8 (eight) hours as needed for muscle spasms. 50 tablet 0  . traZODone (DESYREL) 100 MG tablet Take 100 mg by mouth at bedtime.     No current facility-administered medications for this visit.    Allergies  Allergen Reactions  . Bee Venom Anaphylaxis  . Oxycodone Other (See Comments)    Adrenal crisis while taking both prednisone and oxycodone   . Prednisone     Adrenal crisis while taking both prednisone and oxycodone   . Verapamil Hives  . Nickel Rash  . Nitrofuran Derivatives Rash       Objective:  BP 125/76   Pulse 70   Wt 190 lb (86.2 kg)   BMI 29.76 kg/m  Gen:  alert, not ill-appearing, no distress, appropriate for age 44: head normocephalic without obvious abnormality, conjunctiva and cornea clear, trachea midline Pulm: Normal  work of breathing, normal phonation, clear to auscultation bilaterally, no wheezes, rales or rhonchi CV: Normal rate, regular rhythm, s1 and s2 distinct, no murmurs, clicks or rubs  Neuro: alert and oriented x 3, no tremor MSK: right knee with mild edema, trace peripheral edema, normal gait and station Skin: right heel with fluid-filled blister  Psych: well-groomed, cooperative, good eye contact, euthymic mood,  affect mood-congruent, speech is articulate, and thought processes clear and goal-directed  Lab Results  Component Value Date   WBC 7.9 12/18/2017   HGB 8.5 (L) 12/18/2017   HCT 26.1 (L) 12/18/2017   MCV 98.5 12/18/2017   PLT 201 12/18/2017   Lab Results  Component Value Date   CREATININE 1.01 (H) 12/17/2017   BUN 13 12/17/2017   NA 137 12/17/2017   K 4.2 12/17/2017   CL 104 12/17/2017   CO2 26 12/17/2017     No results found for this or any previous visit (from the past 72 hour(s)). No results found.    Assessment and Plan: 67 y.o. female with   1. Hospital discharge follow-up - doing well s/p right TKR. Keeping follow-up with ortho - CKD is stable  2. Blister of right heel, initial encounter - stop offending exercise - Moleskin padding  Patient education and anticipatory guidance given Patient agrees with treatment plan Follow-up as needed if symptoms worsen or fail to improve  Darlyne Russian PA-C

## 2018-01-04 NOTE — Patient Instructions (Addendum)
For blister: - stop the bedsheet exercise and avoid direct rubbing/friction - purchase Moleskin padding from the pharmacy. Try 2 different methods for comfort 1. Cut the moleskin into a donut shape with a hole in the center for the blister 2. Cover the entire area with moleskin   Blisters, Adult A blister is a raised bubble of skin filled with liquid. Blisters often develop in an area of the skin that repeatedly rubs or presses against another surface (friction blister). Friction blisters can occur on any part of the body, but they usually develop on the hands or feet. Long-term pressure on the same area of the skin can also lead to areas of hardened skin (calluses). What are the causes? A blister can be caused by:  An injury.  A burn.  An allergic reaction.  An infection.  Exposure to irritating chemicals.  Friction, especially in an area with a lot of heat and moisture.  Friction blisters often result from:  Sports.  Repetitive activities.  Using tools and doing other activities without wearing gloves.  Shoes that are too tight or too loose.  What are the signs or symptoms? A blister is often round and looks like a bump. It may:  Itch.  Be painful to the touch.  Before a blister forms, the skin may:  Become red.  Feel warm.  Itch.  Be painful to the touch.  How is this diagnosed? A blister is diagnosed with a physical exam. How is this treated? Treatment usually involves protecting the area where the blister has formed until the skin has healed. Other treatments may include:  A bandage (dressing) to cover the blister.  Extra padding around and over the blister, so that it does not rub on anything.  Antibiotic ointment.  Most blisters break open, dry up, and go away on their own within 1-2 weeks. Blisters that are very painful may be drained before they break open on their own. If the blister is large or painful, it can be drained by: 1. Sterilizing a  small needle with rubbing alcohol. 2. Washing your hands with soap and water. 3. Inserting the needle in the edge of the blister to make a small hole. Some fluid will drain out of the hole. Let the top or roof of the blister stay in place. This helps the skin heal. 4. Washing the blister with mild soap and water. 5. Covering the blister with antibiotic ointment, if prescribed by your health care provider, and a dressing.  Some blisters may need to be drained by a health care provider. Follow these instructions at home:  Protect the area where the blister has formed as told by your health care provider.  Keep your blister clean and dry. This helps to prevent infection.  Do not pop your blister. This can cause infection.  If you were prescribed an antibiotic, use it as told by your health care provider. Do not stop using the antibiotic even if your condition improves.  Wear different shoes until the blister heals.  Avoid the activity that caused the blister until your blister heals.  Check your blister every day for signs of infection. Check for: ? More redness, swelling, or pain. ? More fluid or blood. ? Warmth. ? Pus or a bad smell. ? The blister getting better and then getting worse. How is this prevented? Taking these steps can help to prevent blisters that are caused by friction. Make sure you:  Wear comfortable shoes that fit well.  Always  wear socks with shoes.  Wear extra socks or use tape, bandages, or pads over blister-prone areas as needed. You may also apply petroleum jelly under bandages in blister-prone areas.  Wear protective gear, such as gloves, when participating in sports or activities that can cause blisters.  Wear loose-fitting, moisture-wicking clothes when participating in sports or activities.  Use powders as needed to keep your feet dry.  Contact a health care provider if:  You have more redness, swelling, or pain around your blister.  You have  more fluid or blood coming from your blister.  Your blister feels warm to the touch.  You have pus or a bad smell coming from your blister.  You have a fever or chills.  Your blister gets better and then it gets worse. This information is not intended to replace advice given to you by your health care provider. Make sure you discuss any questions you have with your health care provider. Document Released: 10/28/2004 Document Revised: 05/19/2016 Document Reviewed: 04/02/2016 Elsevier Interactive Patient Education  2018 [employer].

## 2018-01-06 DIAGNOSIS — Z471 Aftercare following joint replacement surgery: Secondary | ICD-10-CM | POA: Diagnosis not present

## 2018-01-06 DIAGNOSIS — I129 Hypertensive chronic kidney disease with stage 1 through stage 4 chronic kidney disease, or unspecified chronic kidney disease: Secondary | ICD-10-CM | POA: Diagnosis not present

## 2018-01-06 DIAGNOSIS — I6523 Occlusion and stenosis of bilateral carotid arteries: Secondary | ICD-10-CM | POA: Diagnosis not present

## 2018-01-06 DIAGNOSIS — N183 Chronic kidney disease, stage 3 (moderate): Secondary | ICD-10-CM | POA: Diagnosis not present

## 2018-01-06 DIAGNOSIS — Z96653 Presence of artificial knee joint, bilateral: Secondary | ICD-10-CM | POA: Diagnosis not present

## 2018-01-06 DIAGNOSIS — M069 Rheumatoid arthritis, unspecified: Secondary | ICD-10-CM | POA: Diagnosis not present

## 2018-01-09 ENCOUNTER — Encounter: Payer: Self-pay | Admitting: Rehabilitative and Restorative Service Providers"

## 2018-01-09 ENCOUNTER — Ambulatory Visit: Payer: Medicare HMO | Admitting: Rehabilitative and Restorative Service Providers"

## 2018-01-09 DIAGNOSIS — R2689 Other abnormalities of gait and mobility: Secondary | ICD-10-CM

## 2018-01-09 DIAGNOSIS — G8929 Other chronic pain: Secondary | ICD-10-CM | POA: Diagnosis not present

## 2018-01-09 DIAGNOSIS — M25561 Pain in right knee: Secondary | ICD-10-CM

## 2018-01-09 DIAGNOSIS — M6281 Muscle weakness (generalized): Secondary | ICD-10-CM

## 2018-01-09 NOTE — Therapy (Signed)
[place], [place], [address] Phone: [phone]   Fax:  [phone]  Physical Therapy Evaluation  Patient Details  Name: Melody Moore MRN: [id number] Date of Birth: 1950-12-24 Referring Provider: Dr Dorna Leitz    Encounter Date: 01/09/2018  PT End of Session - 01/09/18 1733    Visit Number  1    Number of Visits  24    Date for PT Re-Evaluation  04/03/18    PT Start Time  1016    PT Stop Time  1119    PT Time Calculation (min)  63 min    Activity Tolerance  Patient tolerated treatment well       Past Medical History:  Diagnosis Date  . Adrenal crisis ([place]) 02/12/2016  . AKI (acute kidney injury) ([place]) 02/2016  . Allergy   . Arthritis   . Carotid stenosis, asymptomatic, bilateral 05/26/2017   Dopplers 02/12/2016: right - mid-distal 40-59% stenosis; left - 40-59% stenosis  . Cataract   . CKD (chronic kidney disease) stage 3, GFR 30-59 ml/min (HCC) 11/02/2017  . Decreased calculated GFR 06/15/2017  . Gallstones 06/22/2017  . History of echocardiogram 11/07/2017   04/09/2016: EF 21%, mild diastolic dysfunction, trivial MR and TR  . Hypertension   . Hypothyroidism   . 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 Rheumatoid arthritis ([place])   . Rheumatoid arthritis involving multiple sites ([place]) 05/26/2017  . Transaminitis 06/15/2017   Normal abdominal US 06/22/17    Past Surgical History:  Procedure Laterality Date  . APPENDECTOMY    . CATARACT EXTRACTION, BILATERAL  2015  . 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  . TOTAL KNEE ARTHROPLASTY Right 12/16/2017   Procedure: RIGHT TOTAL KNEE ARTHROPLASTY;  Surgeon: Dorna Leitz, MD;  Location: [facility];  Service: Orthopedics;  Laterality: Right;  Adductor Block    There were no vitals filed for this visit.   Subjective Assessment - 01/09/18 1020    Subjective  Margie reports that she has experienced Rt knee pain for several years which has  gradually worsened. She underwent Rt TKA 1/94/17 with no complications. She received HHPT for 2-3 weeks..     Pertinent History  Lt TKA 4/08 with some complications from medications involving renal crisis requiring hospitalization for several days - now resolved.     Patient Stated Goals  get knee working well again     Currently in Pain?  Yes    Pain Score  3     Pain Location  Knee    Pain Orientation  Right    Pain Descriptors / Indicators  Dull;Burning    Pain Type  Chronic pain;Surgical pain    Pain Onset  More than a month ago    Pain Frequency  Intermittent    Aggravating Factors   moving around too much    Pain Relieving Factors  walking; ice         [facility] PT Assessment - 01/09/18 0001      Assessment   Medical Diagnosis  Rt TKA     Referring Provider  Dr Dorna Leitz     Onset Date/Surgical Date  12/16/17    Hand Dominance  Right    Next MD Visit  01/29/18    Prior Therapy  yes in hospital and home healt following TKA       Precautions   Precautions  None      Balance Screen   Has the patient fallen in the  past 6 months  No    Has the patient had a decrease in activity level because of a fear of falling?   No    Is the patient reluctant to leave their home because of a fear of falling?   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     Leisure  household chores; reading; bible study       Observation/Other Assessments   Observations  Rt knee warm to touch compared to [employer]  healing incision Rt knee with dried eschar noted in the distal 1/2 of the incisiton intermittently  spaced; proximal incision healed nicely with healthy pink tissue noted at incision     Focus on Therapeutic Outcomes (FOTO)   59% limitation       Observation/Other Assessments-Edema    Edema  -- moderate edema Rt knee       Posture/Postural Control   Posture Comments  head forward; shoulders rounded; wt shifted to Lt in standing       AROM   Right/Left Hip  -- tight hip extension bilat     Right Knee Extension  -2    Right Knee Flexion  91    Left Knee Extension  0    Left Knee Flexion  121    Right/Left Ankle  -- WFL's       Strength   Overall Strength Comments  pt declined to lie prone for MMT or ROM assessment - stating she was afraid to do this because she hs not been on her stomach in a "long time"    Right/Left Hip  -- assessed in sitting, supine and sidelying     Right Hip Flexion  4-/5    Right Hip Extension  3+/5    Right Hip ABduction  3+/5    Left Hip Flexion  4+/5    Left Hip Extension  4/5    Left Hip ABduction  4/5    Right Knee Flexion  4/5    Right Knee Extension  4+/5    Left Knee Flexion  5/5    Left Knee Extension  5/5    Right/Left Ankle  -- WNL's bilat       Flexibility   Hamstrings  tight Rt ~ 75 deg; Lt 80 deg    Quadriceps  tight - pt unable to lie prone for testing - tight when tested sidelying bilat     ITB  mild tightness bilat       Palpation   Patella mobility  decreased mobility Rt     Palpation comment  muscular tightness Rt quad       Ambulation/Gait   Gait Comments  ambulation with single point cane with ER of LE's; decreased wt bearing Rt LE in stance phase; flexed forward at hips       Balance   Balance Assessed  -- SLS 3-4 sec each LE  Objective measurements completed on examination: See above findings.      [facility] Adult PT Treatment/Exercise - 01/09/18 0001      Knee/Hip Exercises: Standing   SLS  10-20 sec x 3 reps each LE     Other Standing Knee Exercises  standing at counter - pressing hips  forward with knees straight working on hip extension 10-20 sec hold x 10       Knee/Hip Exercises: Seated   Heel Slides  AROM;Right;5 reps working on knee flexion       Knee/Hip Exercises: Supine   Quad Sets  Strengthening;Right;10 reps 5 sec hold     Straight Leg Raises  Strengthening;Right;10 reps lifting ~ 12 inches from table 5 sec hold lower slowly     Knee Flexion  AROM;Right;5 reps working on knee flexion       Vasopneumatic   Number Minutes Vasopneumatic   16 minutes    Vasopnuematic Location   Knee Rt    Vasopneumatic Pressure  Low    Vasopneumatic Temperature   34 deg              PT Education - 01/09/18 1055    Education provided  Yes    Education Details  HEP     Person(s) Educated  Patient    Methods  Explanation;Demonstration;Tactile cues;Verbal cues;Handout    Comprehension  Verbalized understanding;Returned demonstration;Verbal cues required;Tactile cues required       PT Short Term Goals - 01/09/18 1742      PT SHORT TERM GOAL #1   Title  Increase Rt knee AROM from 0-115 degrees 02/20/18     Time  6    Period  Weeks    Status  New      PT SHORT TERM GOAL #2   Title  Increase Rt LE strength to 4+/5 in limited planes 02/20/18    Time  6    Period  Weeks      PT SHORT TERM GOAL #3   Title  Improve gait pattern with single point cane as needed for safety for community distances 02/20/18    Time  6    Period  Weeks    Status  New        PT Long Term Goals - 01/09/18 1744      PT LONG TERM GOAL #1   Title  AROM Rt knee 0-120 degrees 04/03/18    Time  12    Period  Weeks    Status  New      PT LONG TERM GOAL #2   Title  5-/5 to 5/5 strength bilat LE's 04/03/18    Time  12    Period  Weeks    Status  New      PT LONG TERM GOAL #3   Title  Independent in gait without assistive device for home/community distances 04/03/18    Time  12    Period  Weeks    Status  New      PT LONG TERM GOAL #4   Title  Independent in HEP 04/03/18    Time  12     Period  Weeks    Status  New      PT LONG TERM GOAL #5   Title  Improve FOTO to </= 46% limitation 04/03/18     Time  12    Period  Weeks    Status  New  Plan - 01/09/18 1736    Clinical Impression Statement  [name] presents s/p Rt TKA 12/16/17. She has some continued edema and pain in the Rt knee as well as decreased ROM/mobilty; decreased strength Rt > Lt LE; abnormal gait pattern and limited functional activity level/ADL's. Patient will benefit from PT to address problems identified.     History and Personal Factors relevant to plan of care:  Lt TKA     Clinical Presentation  Stable    Clinical Decision Making  Low    Rehab Potential  Good    PT Frequency  2x / week    PT Duration  12 weeks    PT Treatment/Interventions  Patient/family education;ADLs/Self Care Home Management;Cryotherapy;Electrical Stimulation;Iontophoresis 4mg /ml Dexamethasone;Moist Heat;Ultrasound;Gait training;Functional mobility training;Therapeutic activities;Therapeutic exercise;Balance training;Neuromuscular re-education;Manual techniques;Passive range of motion;Dry needling    PT Next Visit Plan  Review HEP; progress with ROM and strengthening program; balance and proprioceptive training; gait training; manual work through [employer] and instruction in scar massage as indicated; modalities as appropriate     Consulted and Agree with Plan of Care  Patient       Patient will benefit from skilled therapeutic intervention in order to improve the following deficits and impairments:  Postural dysfunction, Improper body mechanics, Pain, Increased fascial restricitons, Increased muscle spasms, Decreased mobility, Decreased range of motion, Decreased strength, Abnormal gait, Decreased balance  Visit Diagnosis: Chronic pain of right knee - Plan: PT plan of care cert/re-cert  Muscle weakness (generalized) - Plan: PT plan of care cert/re-cert  Other abnormalities of gait and mobility - Plan: PT plan of care  cert/re-cert     Problem List Patient Active Problem List   Diagnosis Date Noted  . Blister of right heel 01/04/2018  . Primary osteoarthritis of right knee 12/16/2017  . Benign hypertension with CKD (chronic kidney disease) stage III ([place]) 12/04/2017  . Primary osteoarthritis involving multiple joints 12/04/2017  . History of echocardiogram 11/07/2017  . CKD (chronic kidney disease) stage 3, GFR 30-59 ml/min (HCC) 11/02/2017  . 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 Gallstones 06/22/2017  . Elevated serum creatinine 06/15/2017  . Elevated alkaline phosphatase level 06/15/2017  . Transaminitis 06/15/2017  . Acquired hypothyroidism 05/26/2017  . Essential hypertension 05/26/2017  . H/O total knee replacement, left 05/26/2017  . History of adrenal insufficiency 05/26/2017  . Rheumatoid arthritis involving multiple sites ([place]) 05/26/2017  . Osteoarthritis of right knee 05/26/2017  . Allergic rhinitis 05/26/2017  . Right carotid bruit 05/26/2017  . Carotid stenosis, asymptomatic, bilateral 05/26/2017  . Allergy to honey bee venom 05/26/2017    Sharyl Panchal Nilda Simmer PT, MPH  01/09/2018, 5:50 PM  [facility] [place], [place], [address] Phone: [phone]   Fax:  [phone]  Name: Takara Sermons MRN: [id number] Date of Birth: November 29, 1950

## 2018-01-09 NOTE — Patient Instructions (Addendum)
Standing at counter - stand tall pressing hips forward; keeping knees straight Hold 30-45 sec and repeat 10 times - 2-4 times/day    Balance: Unilateral   Holding to counter as needed.  Attempt to balance on left leg, eyes open. Hold _20-30___ seconds. Repeat __3-5__ times per set. Do __2-3__ sessions per day. Repeat with right leg

## 2018-01-11 ENCOUNTER — Encounter: Payer: Self-pay | Admitting: Physician Assistant

## 2018-01-12 ENCOUNTER — Other Ambulatory Visit: Payer: Self-pay

## 2018-01-12 NOTE — Patient Outreach (Signed)
[facility]) Care Management  01/12/2018  Lessa Huge June 21, 1951 [id number]   Initial assessment / Transition of care  Referral date:12/21/17 Referral source:discharged from [facility] hospital on 12/18/17 Insurance:[employer] Attempt #3  Telephone call to patient regarding transition of care follow up. Unable to reach patient. HIPAA compliant voice message left with call back phone number.   PLAN:  RNCM  Will close patient due to being unable to reach.  RNCM will send closure letter to patient and primary MD  Quinn Plowman RN,BSN,CCM [facility] Telephonic  [phone]

## 2018-01-13 ENCOUNTER — Ambulatory Visit: Payer: Medicare HMO | Admitting: Rehabilitative and Restorative Service Providers"

## 2018-01-13 DIAGNOSIS — M25561 Pain in right knee: Secondary | ICD-10-CM | POA: Diagnosis not present

## 2018-01-13 DIAGNOSIS — M6281 Muscle weakness (generalized): Secondary | ICD-10-CM

## 2018-01-13 DIAGNOSIS — G8929 Other chronic pain: Secondary | ICD-10-CM | POA: Diagnosis not present

## 2018-01-13 DIAGNOSIS — R2689 Other abnormalities of gait and mobility: Secondary | ICD-10-CM | POA: Diagnosis not present

## 2018-01-13 NOTE — Patient Instructions (Addendum)
Bridging    Slowly raise buttocks from floor, keeping core tight. Hold 5 seconds Repeat _10___ times per set. Do __1-2__ sets per session. Do __1__ sessions per day.   Strengthening: Hip Abductor - Resisted    With band looped around both legs above knees, push one knee out holding the opposite knee still. Repeat with the other knee alternating knees  Repeat __10__ times per set. Do __2-3__ sets per session. Do __1__ sessions per day.  Bracing With Heel Slides (Supine)    With neutral spine, tighten pelvic floor and abdominals and hold. Slide right heel to bottom. Hold 10 sec Repeat _10__ times. Do _2-3__ times a day. Can use dog leash around foot to help slide leg up bending the knee   SIT TO STAND: No Device    Sit with feet shoulder-width apart, on floor. Lean chest forward, raise hips up from surface. Straighten hips and knees. Weight bear equally on left and right sides. __10_ reps per set, _2-3__ sets per day. Keep feet even and spread apart at about shoulder width

## 2018-01-13 NOTE — Therapy (Signed)
[place], [place], [address] Phone: [phone]   Fax:  [phone]  Physical Therapy Treatment  Patient Details  Name: Melody Moore MRN: [id number] Date of Birth: 1951-05-03 Referring Provider: Dr Dorna Leitz    Encounter Date: 01/13/2018  PT End of Session - 01/13/18 1155    Visit Number  2    Number of Visits  24    Date for PT Re-Evaluation  04/03/18    PT Start Time  1146    PT Stop Time  1245    PT Time Calculation (min)  59 min    Activity Tolerance  Patient tolerated treatment well       Past Medical History:  Diagnosis Date  . Adrenal crisis ([place]) 02/12/2016  . AKI (acute kidney injury) ([place]) 02/2016  . Allergy   . Arthritis   . Carotid stenosis, asymptomatic, bilateral 05/26/2017   Dopplers 02/12/2016: right - mid-distal 40-59% stenosis; left - 40-59% stenosis  . Cataract   . CKD (chronic kidney disease) stage 3, GFR 30-59 ml/min (HCC) 11/02/2017  . Decreased calculated GFR 06/15/2017  . Gallstones 06/22/2017  . History of echocardiogram 11/07/2017   04/09/2016: EF 50%, mild diastolic dysfunction, trivial MR and TR  . Hypertension   . Hypothyroidism   . 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 Rheumatoid arthritis ([place])   . Rheumatoid arthritis involving multiple sites ([place]) 05/26/2017  . Transaminitis 06/15/2017   Normal abdominal US 06/22/17    Past Surgical History:  Procedure Laterality Date  . APPENDECTOMY    . CATARACT EXTRACTION, BILATERAL  2015  . 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  . TOTAL KNEE ARTHROPLASTY Right 12/16/2017   Procedure: RIGHT TOTAL KNEE ARTHROPLASTY;  Surgeon: Dorna Leitz, MD;  Location: [facility];  Service: Orthopedics;  Laterality: Right;  Adductor Block    There were no vitals filed for this visit.  Subjective Assessment - 01/13/18 1159    Subjective  Working on exercises at home. Awakens in the night with stiffness in the Rt knee. she  will get up and walk to loosen her knee up.    Currently in Pain?  No/denies         [facility] PT Assessment - 01/13/18 0001      Assessment   Medical Diagnosis  Rt TKA     Referring Provider  Dr Dorna Leitz     Onset Date/Surgical Date  12/16/17    Hand Dominance  Right    Next MD Visit  01/29/18    Prior Therapy  yes in hospital and home healt following TKA       AROM   Right Knee Flexion  108                   [facility] Adult PT Treatment/Exercise - 01/13/18 0001      Knee/Hip Exercises: Stretches   Passive Hamstring Stretch  Right;3 reps;30 seconds supine with strap     Quad Stretch  Right;5 reps;30 seconds prone with strap     ITB Stretch  Right;1 rep;30 seconds supine with strap     Gastroc Stretch  Right;2 reps;30 seconds    Soleus Stretch  Right;2 reps;30 seconds      Knee/Hip Exercises: Aerobic   Nustep  L5 x 5 min (arms 12)       Knee/Hip Exercises: Seated   Heel Slides  AROM;AAROM;Right;5 reps sitting at edge of table scooting hips fwd/stretch flexion  Sit to Sand  10 reps;without UE support;with UE support      Knee/Hip Exercises: Supine   Quad Sets  Strengthening;Right;10 reps 5 sec hold     Heel Slides  AAROM;Right;10 reps 10 sec hold w/ strap for assist     Bridges  Strengthening;Right;Left;10 reps    Straight Leg Raises  Strengthening;Right;10 reps lifting ~ 12 inches from table 5 sec hold lower slowly     Other Supine Knee/Hip Exercises  clam with greenTB holding one LE still moving the opposite - alternating legs x 10 each       Electrical Stimulation   Electrical Stimulation Location  Rt knee     Electrical Stimulation Action  ion repelling     Electrical Stimulation Parameters  to tolerance    Electrical Stimulation Goals  Edema;Pain      Vasopneumatic   Number Minutes Vasopneumatic   15 minutes    Vasopnuematic Location   Knee Rt    Vasopneumatic Pressure  Low    Vasopneumatic Temperature   34 deg              PT Education -  01/13/18 1232    Education provided  Yes    Education Details  HEP TENS    Person(s) Educated  Patient    Methods  Explanation;Demonstration;Tactile cues;Verbal cues;Handout    Comprehension  Verbalized understanding;Returned demonstration;Verbal cues required;Tactile cues required       PT Short Term Goals - 01/09/18 1742      PT SHORT TERM GOAL #1   Title  Increase Rt knee AROM from 0-115 degrees 02/20/18     Time  6    Period  Weeks    Status  New      PT SHORT TERM GOAL #2   Title  Increase Rt LE strength to 4+/5 in limited planes 02/20/18    Time  6    Period  Weeks      PT SHORT TERM GOAL #3   Title  Improve gait pattern with single point cane as needed for safety for community distances 02/20/18    Time  6    Period  Weeks    Status  New        PT Long Term Goals - 01/09/18 1744      PT LONG TERM GOAL #1   Title  AROM Rt knee 0-120 degrees 04/03/18    Time  12    Period  Weeks    Status  New      PT LONG TERM GOAL #2   Title  5-/5 to 5/5 strength bilat LE's 04/03/18    Time  12    Period  Weeks    Status  New      PT LONG TERM GOAL #3   Title  Independent in gait without assistive device for home/community distances 04/03/18    Time  12    Period  Weeks    Status  New      PT LONG TERM GOAL #4   Title  Independent in HEP 04/03/18    Time  12    Period  Weeks    Status  New      PT LONG TERM GOAL #5   Title  Improve FOTO to </= 46% limitation 04/03/18     Time  12    Period  Weeks    Status  New            Plan -  01/13/18 1247    Clinical Impression Statement  Working well with exercises in clinic Good increase in Rt knee flexion. Progressing well with Rt TKA rehab.     Rehab Potential  Good    PT Frequency  2x / week    PT Duration  12 weeks    PT Treatment/Interventions  Patient/family education;ADLs/Self Care Home Management;Cryotherapy;Electrical Stimulation;Iontophoresis 4mg /ml Dexamethasone;Moist Heat;Ultrasound;Gait training;Functional  mobility training;Therapeutic activities;Therapeutic exercise;Balance training;Neuromuscular re-education;Manual techniques;Passive range of motion;Dry needling    PT Next Visit Plan  Review HEP; progress with ROM and strengthening program; balance and proprioceptive training; gait training; manual work through [employer] and instruction in scar massage as indicated; modalities as appropriate     Consulted and Agree with Plan of Care  Patient       Patient will benefit from skilled therapeutic intervention in order to improve the following deficits and impairments:  Postural dysfunction, Improper body mechanics, Pain, Increased fascial restricitons, Increased muscle spasms, Decreased mobility, Decreased range of motion, Decreased strength, Abnormal gait, Decreased balance  Visit Diagnosis: Chronic pain of right knee  Muscle weakness (generalized)  Other abnormalities of gait and mobility     Problem List Patient Active Problem List   Diagnosis Date Noted  . Blister of right heel 01/04/2018  . Primary osteoarthritis of right knee 12/16/2017  . Benign hypertension with CKD (chronic kidney disease) stage III ([place]) 12/04/2017  . Primary osteoarthritis involving multiple joints 12/04/2017  . History of echocardiogram 11/07/2017  . CKD (chronic kidney disease) stage 3, GFR 30-59 ml/min (HCC) 11/02/2017  . 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 Gallstones 06/22/2017  . Elevated serum creatinine 06/15/2017  . Elevated alkaline phosphatase level 06/15/2017  . Transaminitis 06/15/2017  . Acquired hypothyroidism 05/26/2017  . Essential hypertension 05/26/2017  . H/O total knee replacement, left 05/26/2017  . History of adrenal insufficiency 05/26/2017  . Rheumatoid arthritis involving multiple sites ([place]) 05/26/2017  . Osteoarthritis of right knee 05/26/2017  . Allergic rhinitis 05/26/2017  . Right carotid bruit 05/26/2017  . Carotid stenosis, asymptomatic, bilateral 05/26/2017   . Allergy to honey bee venom 05/26/2017    Aliha Diedrich Nilda Simmer PT, MPH  01/13/2018, 12:50 PM  [facility] [place], [place], [address] Phone: [phone]   Fax:  [phone]  Name: Melody Moore MRN: [id number] Date of Birth: 1951-06-20

## 2018-01-16 ENCOUNTER — Ambulatory Visit: Payer: Medicare HMO | Admitting: Rehabilitative and Restorative Service Providers"

## 2018-01-16 DIAGNOSIS — R2689 Other abnormalities of gait and mobility: Secondary | ICD-10-CM | POA: Diagnosis not present

## 2018-01-16 DIAGNOSIS — M25561 Pain in right knee: Secondary | ICD-10-CM | POA: Diagnosis not present

## 2018-01-16 DIAGNOSIS — G8929 Other chronic pain: Secondary | ICD-10-CM

## 2018-01-16 DIAGNOSIS — M6281 Muscle weakness (generalized): Secondary | ICD-10-CM | POA: Diagnosis not present

## 2018-01-16 NOTE — Therapy (Signed)
[place], [place], [address] Phone: [phone]   Fax:  [phone]  Physical Therapy Treatment  Patient Details  Name: Melody Moore MRN: [id number] Date of Birth: 1950/11/19 Referring Provider: Dr Dorna Leitz    Encounter Date: 01/16/2018  PT End of Session - 01/16/18 1118    Visit Number  3    Number of Visits  24    Date for PT Re-Evaluation  04/03/18    PT Start Time  1100    PT Stop Time  1152    PT Time Calculation (min)  52 min    Activity Tolerance  Patient tolerated treatment well       Past Medical History:  Diagnosis Date  . Adrenal crisis ([place]) 02/12/2016  . AKI (acute kidney injury) ([place]) 02/2016  . Allergy   . Arthritis   . Carotid stenosis, asymptomatic, bilateral 05/26/2017   Dopplers 02/12/2016: right - mid-distal 40-59% stenosis; left - 40-59% stenosis  . Cataract   . CKD (chronic kidney disease) stage 3, GFR 30-59 ml/min (HCC) 11/02/2017  . Decreased calculated GFR 06/15/2017  . Gallstones 06/22/2017  . History of echocardiogram 11/07/2017   04/09/2016: EF 32%, mild diastolic dysfunction, trivial MR and TR  . Hypertension   . Hypothyroidism   . 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 Rheumatoid arthritis ([place])   . Rheumatoid arthritis involving multiple sites ([place]) 05/26/2017  . Transaminitis 06/15/2017   Normal abdominal US 06/22/17    Past Surgical History:  Procedure Laterality Date  . APPENDECTOMY    . CATARACT EXTRACTION, BILATERAL  2015  . 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  . TOTAL KNEE ARTHROPLASTY Right 12/16/2017   Procedure: RIGHT TOTAL KNEE ARTHROPLASTY;  Surgeon: Dorna Leitz, MD;  Location: [facility];  Service: Orthopedics;  Laterality: Right;  Adductor Block    There were no vitals filed for this visit.  Subjective Assessment - 01/16/18 1124    Subjective  Doing well with exercises at home. Feeling better each week. Not using the cane much at  home - and feels safe.     Currently in Pain?  No/denies         [facility] PT Assessment - 01/16/18 0001      Assessment   Medical Diagnosis  Rt TKA     Referring Provider  Dr Dorna Leitz     Onset Date/Surgical Date  12/16/17    Hand Dominance  Right    Next MD Visit  01/29/18    Prior Therapy  yes in hospital and home healt following TKA       AROM   Right Knee Flexion  112                   [facility] Adult PT Treatment/Exercise - 01/16/18 0001      Ambulation/Gait   Gait Comments  ambulation without assistive device in clinic - good improvement in gait pattern       Knee/Hip Exercises: Aerobic   Nustep  L5 x 5 min (arms 12)       Knee/Hip Exercises: Standing   Hip Flexion  AROM;Right;Left;20 reps;Knee bent touching toe to 12 inch step alternating LE's     Lateral Step Up  Right;2 sets;10 reps;Hand Hold: 2;Step Height: 4"    Forward Step Up  Right;2 sets;10 reps;Hand Hold: 2;Step Height: 4"    SLS  30 sec x 3 reps each LE       Knee/Hip  Exercises: Seated   Heel Slides  AROM;AAROM;Right;5 reps sitting at edge of table scooting hips fwd/stretch flexion     Sit to Sand  10 reps;without UE support;with UE support      Knee/Hip Exercises: Supine   Quad Sets  Strengthening;Right;10 reps 5 sec hold     Heel Slides  AAROM;Right;10 reps 10 sec hold w/ strap for assist     Bridges  Strengthening;Right;Left;10 reps    Straight Leg Raises  Strengthening;Right;10 reps lifting ~ 12 inches from table 5 sec hold lower slowly     Other Supine Knee/Hip Exercises  clam with greenTB holding one LE still moving the opposite - alternating legs x 10 each 2 sets       Electrical Stimulation   Electrical Stimulation Location  Rt knee     Electrical Stimulation Action  Ion repelling    Electrical Stimulation Parameters  to tolerance    Electrical Stimulation Goals  Edema;Pain      Vasopneumatic   Number Minutes Vasopneumatic   15 minutes    Vasopnuematic Location   Knee Rt     Vasopneumatic Pressure  Low    Vasopneumatic Temperature   34 deg              PT Education - 01/16/18 1134    Education provided  Yes    Education Details  HEP     Person(s) Educated  Patient    Methods  Explanation;Demonstration;Tactile cues;Verbal cues;Handout    Comprehension  Verbalized understanding;Returned demonstration;Verbal cues required;Tactile cues required       PT Short Term Goals - 01/09/18 1742      PT SHORT TERM GOAL #1   Title  Increase Rt knee AROM from 0-115 degrees 02/20/18     Time  6    Period  Weeks    Status  New      PT SHORT TERM GOAL #2   Title  Increase Rt LE strength to 4+/5 in limited planes 02/20/18    Time  6    Period  Weeks      PT SHORT TERM GOAL #3   Title  Improve gait pattern with single point cane as needed for safety for community distances 02/20/18    Time  6    Period  Weeks    Status  New        PT Long Term Goals - 01/16/18 1118      PT LONG TERM GOAL #1   Title  AROM Rt knee 0-120 degrees 04/03/18    Time  12    Period  Weeks    Status  On-going      PT LONG TERM GOAL #2   Title  5-/5 to 5/5 strength bilat LE's 04/03/18    Time  12    Period  Weeks    Status  On-going      PT LONG TERM GOAL #3   Title  Independent in gait without assistive device for home/community distances 04/03/18    Time  12    Period  Weeks    Status  On-going      PT LONG TERM GOAL #4   Title  Independent in HEP 04/03/18    Time  12    Period  Weeks    Status  On-going      PT LONG TERM GOAL #5   Title  Improve FOTO to </= 46% limitation 04/03/18     Time  12  Period  Weeks    Status  On-going            Plan - 01/16/18 1119    Clinical Impression Statement  Good progress with gait -ambulating without assistive device at home and in clinic; bringing cane for safety when out in the community. ROM increasing. Tolerating increased exercise in clinic. Progressing well.     Rehab Potential  Good    PT Frequency  2x / week    PT  Duration  12 weeks    PT Treatment/Interventions  Patient/family education;ADLs/Self Care Home Management;Cryotherapy;Electrical Stimulation;Iontophoresis 4mg /ml Dexamethasone;Moist Heat;Ultrasound;Gait training;Functional mobility training;Therapeutic activities;Therapeutic exercise;Balance training;Neuromuscular re-education;Manual techniques;Passive range of motion;Dry needling    PT Next Visit Plan  Review HEP; progress with ROM and strengthening program; balance and proprioceptive training; gait training; manual work through [employer] and instruction in scar massage as indicated; modalities as appropriate     Consulted and Agree with Plan of Care  Patient       Patient will benefit from skilled therapeutic intervention in order to improve the following deficits and impairments:  Postural dysfunction, Improper body mechanics, Pain, Increased fascial restricitons, Increased muscle spasms, Decreased mobility, Decreased range of motion, Decreased strength, Abnormal gait, Decreased balance  Visit Diagnosis: Chronic pain of right knee  Muscle weakness (generalized)  Other abnormalities of gait and mobility     Problem List Patient Active Problem List   Diagnosis Date Noted  . Blister of right heel 01/04/2018  . Primary osteoarthritis of right knee 12/16/2017  . Benign hypertension with CKD (chronic kidney disease) stage III ([place]) 12/04/2017  . Primary osteoarthritis involving multiple joints 12/04/2017  . History of echocardiogram 11/07/2017  . CKD (chronic kidney disease) stage 3, GFR 30-59 ml/min (HCC) 11/02/2017  . 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 Gallstones 06/22/2017  . Elevated serum creatinine 06/15/2017  . Elevated alkaline phosphatase level 06/15/2017  . Transaminitis 06/15/2017  . Acquired hypothyroidism 05/26/2017  . Essential hypertension 05/26/2017  . H/O total knee replacement, left 05/26/2017  . History of adrenal insufficiency 05/26/2017  .  Rheumatoid arthritis involving multiple sites ([place]) 05/26/2017  . Osteoarthritis of right knee 05/26/2017  . Allergic rhinitis 05/26/2017  . Right carotid bruit 05/26/2017  . Carotid stenosis, asymptomatic, bilateral 05/26/2017  . Allergy to honey bee venom 05/26/2017    Vic Esco Nilda Simmer PT, MPH  01/16/2018, 11:43 AM  [facility] [place], [place], [address] Phone: [phone]   Fax:  [phone]  Name: Melody Moore MRN: [id number] Date of Birth: 07/09/1951

## 2018-01-16 NOTE — Patient Instructions (Signed)
   Anterior Step-Up    Stand with _4-6__ inch step placed in A direction. Step onto step with right foot without pushing off with the ground foot. Finish with ground foot in touch balance on step and return _10__ times. _2__ sets _1-2__ times per day.   Lateral Step Up    Stand to side of step. Step up leading with left leg. Slowly step down leading with opposite leg. Perform _10__ reps.

## 2018-01-19 ENCOUNTER — Encounter: Payer: Medicare HMO | Admitting: Rehabilitative and Restorative Service Providers"

## 2018-01-19 ENCOUNTER — Encounter: Payer: Self-pay | Admitting: Rehabilitative and Restorative Service Providers"

## 2018-01-19 ENCOUNTER — Ambulatory Visit (INDEPENDENT_AMBULATORY_CARE_PROVIDER_SITE_OTHER): Payer: Medicare HMO | Admitting: Rehabilitative and Restorative Service Providers"

## 2018-01-19 DIAGNOSIS — M6281 Muscle weakness (generalized): Secondary | ICD-10-CM | POA: Diagnosis not present

## 2018-01-19 DIAGNOSIS — G8929 Other chronic pain: Secondary | ICD-10-CM

## 2018-01-19 DIAGNOSIS — R2689 Other abnormalities of gait and mobility: Secondary | ICD-10-CM | POA: Diagnosis not present

## 2018-01-19 DIAGNOSIS — M25561 Pain in right knee: Secondary | ICD-10-CM | POA: Diagnosis not present

## 2018-01-19 NOTE — Therapy (Signed)
[facility]  [place], [place], [address] Phone: [phone]   Fax:  [phone]  Physical Therapy Treatment  Patient Details  Name: Melody Moore MRN: [id number] Date of Birth: 11-Feb-1951 Referring Provider: Dr Dorna Leitz    Encounter Date: 01/19/2018  PT End of Session - 01/19/18 1406    Visit Number  4    Number of Visits  24    Date for PT Re-Evaluation  04/03/18    PT Start Time  1400    PT Stop Time  1456    PT Time Calculation (min)  56 min    Activity Tolerance  Patient tolerated treatment well       Past Medical History:  Diagnosis Date  . Adrenal crisis ([place]) 02/12/2016  . AKI (acute kidney injury) ([place]) 02/2016  . Allergy   . Arthritis   . Carotid stenosis, asymptomatic, bilateral 05/26/2017   Dopplers 02/12/2016: right - mid-distal 40-59% stenosis; left - 40-59% stenosis  . Cataract   . CKD (chronic kidney disease) stage 3, GFR 30-59 ml/min (HCC) 11/02/2017  . Decreased calculated GFR 06/15/2017  . Gallstones 06/22/2017  . History of echocardiogram 11/07/2017   04/09/2016: EF 86%, mild diastolic dysfunction, trivial MR and TR  . Hypertension   . Hypothyroidism   . 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 Rheumatoid arthritis ([place])   . Rheumatoid arthritis involving multiple sites ([place]) 05/26/2017  . Transaminitis 06/15/2017   Normal abdominal US 06/22/17    Past Surgical History:  Procedure Laterality Date  . APPENDECTOMY    . CATARACT EXTRACTION, BILATERAL  2015  . 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  . TOTAL KNEE ARTHROPLASTY Right 12/16/2017   Procedure: RIGHT TOTAL KNEE ARTHROPLASTY;  Surgeon: Dorna Leitz, MD;  Location: [facility];  Service: Orthopedics;  Laterality: Right;  Adductor Block    There were no vitals filed for this visit.  Subjective Assessment - 01/19/18 1407    Subjective  No problems. Knee is doing well. She is wolking at home without cane. She is working on  her exercises at home.     Currently in Pain?  No/denies         [facility] PT Assessment - 01/19/18 0001      Assessment   Medical Diagnosis  Rt TKA     Referring Provider  Dr Dorna Leitz     Onset Date/Surgical Date  12/16/17    Hand Dominance  Right    Next MD Visit  01/29/18    Prior Therapy  yes in hospital and home healt following TKA       AROM   Right Knee Flexion  117                   [facility] Adult PT Treatment/Exercise - 01/19/18 0001      Knee/Hip Exercises: Stretches   Quad Stretch  Right;30 seconds;2 reps prone with strap     Other Knee/Hip Stretches  hip/knee flexion with strap x 3       Knee/Hip Exercises: Aerobic   Recumbent Bike  L2 x 6 min       Knee/Hip Exercises: Standing   Hip Flexion  AROM;Right;Left;20 reps;Knee bent touching toe to 12 inch step alternating LE's     Lateral Step Up  Right;2 sets;10 reps;Hand Hold: 2;Step Height: 6"    Forward Step Up  Right;2 sets;10 reps;Hand Hold: 2;Step Height: 6"    Wall Squat  10 reps small orange  ball btn knees     SLS  30 sec x 3 reps each LE     Other Standing Knee Exercises  knee extension into small orange ball 5 sec hold x 10 reps     Other Standing Knee Exercises  walking sidesteps; heel to toe; backwards walking x 5 each direction x 10 feet       Knee/Hip Exercises: Seated   Sit to Sand  10 reps;with UE support;without UE support UE support moving sit to stand no UE support stand to sit      Knee/Hip Exercises: Supine   Heel Slides  AAROM;Right;10 reps 10 sec hold w/ strap for assist       Electrical Stimulation   Electrical Stimulation Location  Rt knee     Electrical Stimulation Action  ion repelling     Electrical Stimulation Parameters  to tolerance    Electrical Stimulation Goals  Edema;Pain      Vasopneumatic   Number Minutes Vasopneumatic   15 minutes    Vasopnuematic Location   Knee Rt    Vasopneumatic Pressure  Low    Vasopneumatic Temperature   34 deg                 PT Short Term Goals - 01/19/18 1409      PT SHORT TERM GOAL #1   Title  Increase Rt knee AROM from 0-115 degrees 02/20/18     Time  6    Period  Weeks    Status  On-going      PT SHORT TERM GOAL #2   Title  Increase Rt LE strength to 4+/5 in limited planes 02/20/18    Time  6    Period  Weeks    Status  On-going      PT SHORT TERM GOAL #3   Title  Improve gait pattern with single point cane as needed for safety for community distances 02/20/18    Time  6    Period  Weeks    Status  Achieved        PT Long Term Goals - 01/16/18 1118      PT LONG TERM GOAL #1   Title  AROM Rt knee 0-120 degrees 04/03/18    Time  12    Period  Weeks    Status  On-going      PT LONG TERM GOAL #2   Title  5-/5 to 5/5 strength bilat LE's 04/03/18    Time  12    Period  Weeks    Status  On-going      PT LONG TERM GOAL #3   Title  Independent in gait without assistive device for home/community distances 04/03/18    Time  12    Period  Weeks    Status  On-going      PT LONG TERM GOAL #4   Title  Independent in HEP 04/03/18    Time  12    Period  Weeks    Status  On-going      PT LONG TERM GOAL #5   Title  Improve FOTO to </= 46% limitation 04/03/18     Time  12    Period  Weeks    Status  On-going            Plan - 01/19/18 1408    Clinical Impression Statement  Progressing well with knee rehab. No significant pain in the knee. ROM and mobility continue to improve.  Returns to MD next week 01/26/18.    Rehab Potential  Good    PT Frequency  2x / week    PT Duration  12 weeks    PT Treatment/Interventions  Patient/family education;ADLs/Self Care Home Management;Cryotherapy;Electrical Stimulation;Iontophoresis 4mg /ml Dexamethasone;Moist Heat;Ultrasound;Gait training;Functional mobility training;Therapeutic activities;Therapeutic exercise;Balance training;Neuromuscular re-education;Manual techniques;Passive range of motion;Dry needling    PT Next Visit Plan  Review HEP;  progress with ROM and strengthening program; balance and proprioceptive training; gait training; manual work through [employer] and instruction in scar massage as indicated; modalities as appropriate     Consulted and Agree with Plan of Care  Patient       Patient will benefit from skilled therapeutic intervention in order to improve the following deficits and impairments:  Postural dysfunction, Improper body mechanics, Pain, Increased fascial restricitons, Increased muscle spasms, Decreased mobility, Decreased range of motion, Decreased strength, Abnormal gait, Decreased balance  Visit Diagnosis: Chronic pain of right knee  Muscle weakness (generalized)  Other abnormalities of gait and mobility     Problem List Patient Active Problem List   Diagnosis Date Noted  . Blister of right heel 01/04/2018  . Primary osteoarthritis of right knee 12/16/2017  . Benign hypertension with CKD (chronic kidney disease) stage III ([place]) 12/04/2017  . Primary osteoarthritis involving multiple joints 12/04/2017  . History of echocardiogram 11/07/2017  . CKD (chronic kidney disease) stage 3, GFR 30-59 ml/min (HCC) 11/02/2017  . 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 Gallstones 06/22/2017  . Elevated serum creatinine 06/15/2017  . Elevated alkaline phosphatase level 06/15/2017  . Transaminitis 06/15/2017  . Acquired hypothyroidism 05/26/2017  . Essential hypertension 05/26/2017  . H/O total knee replacement, left 05/26/2017  . History of adrenal insufficiency 05/26/2017  . Rheumatoid arthritis involving multiple sites ([place]) 05/26/2017  . Osteoarthritis of right knee 05/26/2017  . Allergic rhinitis 05/26/2017  . Right carotid bruit 05/26/2017  . Carotid stenosis, asymptomatic, bilateral 05/26/2017  . Allergy to honey bee venom 05/26/2017    Chancey Ringel Nilda Simmer PT, MPH  01/19/2018, 2:51 PM  [facility] [place], [place],  [address] Phone: [phone]   Fax:  [phone]  Name: Henretter Piekarski MRN: [id number] Date of Birth: [birth year]

## 2018-01-23 ENCOUNTER — Other Ambulatory Visit: Payer: Self-pay | Admitting: Physician Assistant

## 2018-01-23 ENCOUNTER — Encounter: Payer: Self-pay | Admitting: Physical Therapy

## 2018-01-23 ENCOUNTER — Ambulatory Visit: Payer: Medicare HMO | Admitting: Physical Therapy

## 2018-01-23 DIAGNOSIS — R2689 Other abnormalities of gait and mobility: Secondary | ICD-10-CM | POA: Diagnosis not present

## 2018-01-23 DIAGNOSIS — M6281 Muscle weakness (generalized): Secondary | ICD-10-CM | POA: Diagnosis not present

## 2018-01-23 DIAGNOSIS — G8929 Other chronic pain: Secondary | ICD-10-CM | POA: Diagnosis not present

## 2018-01-23 DIAGNOSIS — M25561 Pain in right knee: Secondary | ICD-10-CM | POA: Diagnosis not present

## 2018-01-23 NOTE — Patient Instructions (Addendum)
Balance: Three-Way toe taps     STAND NEXT to COUNTER Stand on left foot, hands on hips. Reach other foot forward _5___ times, sideways __5__ times, back _5___ times. Hold each position __1_ seconds. Relax.   Balance: Unilateral    Attempt to balance on left leg, eyes open. Hold __20__ seconds. Repeat __2-3__ times per set.  If ready, slowly turn head to left and right.   Therapeutic - Bridging    Lift buttocks, keeping back straight and arms on floor. Hold _2___ seconds, slowly lower hips. Repeat __5-10__ times.   [facility] [place], [place] [address]  [phone] (office) [phone] (fax)

## 2018-01-23 NOTE — Therapy (Signed)
[place], [place], [address] Phone: [phone]   Fax:  [phone]  Physical Therapy Treatment  Patient Details  Name: Melody Moore MRN: [id number] Date of Birth: 05/28/1951 Referring Provider: Dr. Dorna Leitz   Encounter Date: 01/23/2018  PT End of Session - 01/23/18 0938    Visit Number  5    Number of Visits  24    Date for PT Re-Evaluation  04/03/18    PT Start Time  0932    PT Stop Time  1031    PT Time Calculation (min)  59 min    Activity Tolerance  Patient tolerated treatment well;No increased pain    Behavior During Therapy  WFL for tasks assessed/performed       Past Medical History:  Diagnosis Date  . Adrenal crisis ([place]) 02/12/2016  . AKI (acute kidney injury) ([place]) 02/2016  . Allergy   . Arthritis   . Carotid stenosis, asymptomatic, bilateral 05/26/2017   Dopplers 02/12/2016: right - mid-distal 40-59% stenosis; left - 40-59% stenosis  . Cataract   . CKD (chronic kidney disease) stage 3, GFR 30-59 ml/min (HCC) 11/02/2017  . Decreased calculated GFR 06/15/2017  . Gallstones 06/22/2017  . History of echocardiogram 11/07/2017   04/09/2016: EF 10%, mild diastolic dysfunction, trivial MR and TR  . Hypertension   . Hypothyroidism   . 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 Rheumatoid arthritis ([place])   . Rheumatoid arthritis involving multiple sites ([place]) 05/26/2017  . Transaminitis 06/15/2017   Normal abdominal US 06/22/17    Past Surgical History:  Procedure Laterality Date  . APPENDECTOMY    . CATARACT EXTRACTION, BILATERAL  2015  . 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  . TOTAL KNEE ARTHROPLASTY Right 12/16/2017   Procedure: RIGHT TOTAL KNEE ARTHROPLASTY;  Surgeon: Dorna Leitz, MD;  Location: [facility];  Service: Orthopedics;  Laterality: Right;  Adductor Block    There were no vitals filed for this visit.  Subjective Assessment - 01/23/18 0935    Subjective  Pt  reports no new changes since last visit.   "I marched this morning and my leg was way up there.  I did a lot of walking this weekend, and that must have helped".  She reports she will be out of town May 9-13th, to [place].     Currently in Pain?  No/denies    Pain Score  0-No pain         [facility] PT Assessment - 01/23/18 0001      Assessment   Medical Diagnosis  Rt TKA     Referring Provider  Dr. Dorna Leitz    Onset Date/Surgical Date  12/16/17    Hand Dominance  Right    Next MD Visit  01/29/18      AROM   Right Knee Extension  -2    Right Knee Flexion  117 heel slide with strap      Strength   Right Hip Flexion  -- 5-/5    Right Hip Extension  4-/5    Right Hip ABduction  4-/5        [facility] Adult PT Treatment/Exercise - 01/23/18 0001      Knee/Hip Exercises: Stretches   Passive Hamstring Stretch  Right;3 reps;30 seconds supine with strap      Knee/Hip Exercises: Aerobic   Nustep  L5 x 5 min (arms 10)       Knee/Hip Exercises: Standing   Lateral Step Up  Right;2 sets;10 reps;Hand Hold: 2;Step Height: 6" VC to slow speed; eccentric control.     Forward Step Up  Right;2 sets;10 reps;Hand Hold: 2;Step Height: 6" VC to slow speed; eccentric control.     SLS  30 sec x 2 reps with occasional steadying with UE,  trial of horiz head turns.   Rt/Lt SLS with toe taps front/side/back with light UE support on counter x 5 reps each leg.       Knee/Hip Exercises: Seated   Other Seated Knee/Hip Exercises  seated scoots x 10 reps of 5-10 sec    Sit to Sand  1 set;10 reps;without UE support eccentric control      Knee/Hip Exercises: Supine   Heel Slides  AAROM;Right;5 reps 5 sec hold w/ strap for assist       Vasopneumatic   Number Minutes Vasopneumatic   15 minutes    Vasopnuematic Location   Knee Rt    Vasopneumatic Pressure  Low    Vasopneumatic Temperature   34 deg              PT Education - 01/23/18 1003    Education Details  HEP    Person(s) Educated  Patient     Methods  Explanation;Handout;Verbal cues;Tactile cues;Demonstration    Comprehension  Verbalized understanding       PT Short Term Goals - 01/23/18 1048      PT SHORT TERM GOAL #1   Title  Increase Rt knee AROM from 0-115 degrees 02/20/18     Time  6    Period  Weeks    Status  Partially Met      PT SHORT TERM GOAL #2   Title  Increase Rt LE strength to 4+/5 in limited planes 02/20/18    Time  6    Period  Weeks    Status  Partially Met      PT SHORT TERM GOAL #3   Title  Improve gait pattern with single point cane as needed for safety for community distances 02/20/18    Time  6    Period  Weeks    Status  Achieved        PT Long Term Goals - 01/23/18 1024      PT LONG TERM GOAL #1   Title  AROM Rt knee 0-120 degrees 04/03/18    Time  12    Period  Weeks    Status  On-going progressing      PT LONG TERM GOAL #2   Title  5-/5 to 5/5 strength bilat LE's 04/03/18    Time  12    Period  Weeks    Status  Partially Met      PT LONG TERM GOAL #3   Title  Independent in gait without assistive device for home/community distances 04/03/18    Time  12    Period  Weeks    Status  Partially Met home without cane, community with cane (if long distances)       PT LONG TERM GOAL #4   Title  Independent in HEP 04/03/18    Time  12    Period  Weeks    Status  On-going      PT LONG TERM GOAL #5   Title  Improve FOTO to </= 46% limitation 04/03/18     Time  12    Period  Weeks    Status  On-going  Plan - 01/23/18 1010    Clinical Impression Statement  Pt demonstrated improved Rt hip flexion strength, continues with decreased hip ext / abduction strength.  Her Rtk nee ROM remains similar to last visit (-2 deg ext, 117 deg flexion).  She tolerated all exercises well, requiring minor cues to slow the speed of exercises.  Pt has partially met LTG#2 and 3 and is progressing well towards remaining established goals.     Rehab Potential  Good    PT Frequency  2x / week    PT  Duration  12 weeks    PT Treatment/Interventions  Patient/family education;ADLs/Self Care Home Management;Cryotherapy;Electrical Stimulation;Iontophoresis 62m/ml Dexamethasone;Moist Heat;Ultrasound;Gait training;Functional mobility training;Therapeutic activities;Therapeutic exercise;Balance training;Neuromuscular re-education;Manual techniques;Passive range of motion;Dry needling    PT Next Visit Plan  continue progressive Rt knee ROM and strengthening.  Manual therapy through quads as indicated.      Consulted and Agree with Plan of Care  Patient       Patient will benefit from skilled therapeutic intervention in order to improve the following deficits and impairments:  Postural dysfunction, Improper body mechanics, Pain, Increased fascial restricitons, Increased muscle spasms, Decreased mobility, Decreased range of motion, Decreased strength, Abnormal gait, Decreased balance  Visit Diagnosis: Chronic pain of right knee  Muscle weakness (generalized)  Other abnormalities of gait and mobility     Problem List Patient Active Problem List   Diagnosis Date Noted  . Blister of right heel 01/04/2018  . Primary osteoarthritis of right knee 12/16/2017  . Benign hypertension with CKD (chronic kidney disease) stage III ([place] 12/04/2017  . Primary osteoarthritis involving multiple joints 12/04/2017  . History of echocardiogram 11/07/2017  . CKD (chronic kidney disease) stage 3, GFR 30-59 ml/min (HCC) 11/02/2017  . 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 Gallstones 06/22/2017  . Elevated serum creatinine 06/15/2017  . Elevated alkaline phosphatase level 06/15/2017  . Transaminitis 06/15/2017  . Acquired hypothyroidism 05/26/2017  . Essential hypertension 05/26/2017  . H/O total knee replacement, left 05/26/2017  . History of adrenal insufficiency 05/26/2017  . Rheumatoid arthritis involving multiple sites ([place] 05/26/2017  . Osteoarthritis of right knee 05/26/2017  . Allergic  rhinitis 05/26/2017  . Right carotid bruit 05/26/2017  . Carotid stenosis, asymptomatic, bilateral 05/26/2017  . Allergy to honey bee venom 05/26/2017   JKerin Perna PTA 01/23/18 10:49 AM  [facility] [address]Phone: [phone]  Fax:  [phone] Name: MBenedetta SundstromMRN: [id number] of Birth: [birth year]

## 2018-01-25 ENCOUNTER — Encounter: Payer: Self-pay | Admitting: Rehabilitative and Restorative Service Providers"

## 2018-01-25 ENCOUNTER — Ambulatory Visit: Payer: Medicare HMO | Admitting: Rehabilitative and Restorative Service Providers"

## 2018-01-25 DIAGNOSIS — M25561 Pain in right knee: Secondary | ICD-10-CM

## 2018-01-25 DIAGNOSIS — M6281 Muscle weakness (generalized): Secondary | ICD-10-CM

## 2018-01-25 DIAGNOSIS — G8929 Other chronic pain: Secondary | ICD-10-CM | POA: Diagnosis not present

## 2018-01-25 DIAGNOSIS — R2689 Other abnormalities of gait and mobility: Secondary | ICD-10-CM

## 2018-01-25 NOTE — Therapy (Signed)
[place]  [place], [place], [address] Phone: [phone]   Fax:  [phone]  Physical Therapy Treatment  Patient Details  Name: Melody Moore MRN: [id number] Date of Birth: [birth year] Referring Provider: Dr Dorna Leitz    Encounter Date: 01/25/2018  PT End of Session - 01/25/18 1515    Visit Number  6    Number of Visits  24    Date for PT Re-Evaluation  04/03/18    PT Start Time  6440    PT Stop Time  1609    PT Time Calculation (min)  55 min    Activity Tolerance  Patient tolerated treatment well;No increased pain       Past Medical History:  Diagnosis Date  . Adrenal crisis ([place]) 02/12/2016  . AKI (acute kidney injury) ([place]) 02/2016  . Allergy   . Arthritis   . Carotid stenosis, asymptomatic, bilateral 05/26/2017   Dopplers 02/12/2016: right - mid-distal 40-59% stenosis; left - 40-59% stenosis  . Cataract   . CKD (chronic kidney disease) stage 3, GFR 30-59 ml/min (HCC) 11/02/2017  . Decreased calculated GFR 06/15/2017  . Gallstones 06/22/2017  . History of echocardiogram 11/07/2017   04/09/2016: EF 34%, mild diastolic dysfunction, trivial MR and TR  . Hypertension   . Hypothyroidism   . 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 Rheumatoid arthritis ([place])   . Rheumatoid arthritis involving multiple sites ([place]) 05/26/2017  . Transaminitis 06/15/2017   Normal abdominal US 06/22/17    Past Surgical History:  Procedure Laterality Date  . APPENDECTOMY    . CATARACT EXTRACTION, BILATERAL  2015  . 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  . TOTAL KNEE ARTHROPLASTY Right 12/16/2017   Procedure: RIGHT TOTAL KNEE ARTHROPLASTY;  Surgeon: Dorna Leitz, MD;  Location: [facility];  Service: Orthopedics;  Laterality: Right;  Adductor Block    There were no vitals filed for this visit.  Subjective Assessment - 01/25/18 1515    Subjective  Melody Moore reports that she continues to do well. She will return to the  MD tomorrow. Hopes she can discontinue the cane. Will be away for almost a week May 9-13.    Currently in Pain?  Yes    Pain Score  1     Pain Location  Knee    Pain Orientation  Right    Pain Descriptors / Indicators  Dull    Pain Type  Chronic pain;Surgical pain    Pain Onset  More than a month ago    Pain Frequency  Intermittent    Aggravating Factors   too much moving    Pain Relieving Factors  walking; ice          [facility] PT Assessment - 01/25/18 0001      Assessment   Medical Diagnosis  Rt TKA     Referring Provider  Dr Dorna Leitz     Onset Date/Surgical Date  12/16/17    Hand Dominance  Right    Next MD Visit  01/29/18      AROM   Right Knee Extension  -2    Right Knee Flexion  120 heel slide with strap      Strength   Right Hip Flexion  -- 5-/5    Right Hip Extension  4-/5    Right Hip ABduction  4-/5      Flexibility   Hamstrings  tight Rt ~ 85 deg; Lt 85 deg    Quadriceps  Rt knee  flexion 93 deg                    [facility] Adult PT Treatment/Exercise - 01/25/18 0001      Knee/Hip Exercises: Stretches   Passive Hamstring Stretch  Right;3 reps;30 seconds supine with strap    Quad Stretch  Right;30 seconds;2 reps      Knee/Hip Exercises: Aerobic   Recumbent Bike  L2 x 7 min       Knee/Hip Exercises: Standing   Lateral Step Up  Right;2 sets;10 reps;Hand Hold: 2;Step Height: 6" VC to slow speed; eccentric control.     Forward Step Up  Right;2 sets;10 reps;Hand Hold: 2;Step Height: 6" VC to slow speed; eccentric control.     Wall Squat  10 reps large orange ball at back - 5x sec hold     SLS  30 sec x 2 reps with occasional steadying with UE, Rt/Lt SLS with toe taps front/side/back with light UE support on counter x 5 reps each leg.       Knee/Hip Exercises: Seated   Sit to Sand  1 set;10 reps;without UE support eccentric control      Knee/Hip Exercises: Supine   Heel Slides  AAROM;Right;5 reps 5 sec hold w/ strap for assist     Straight Leg  Raises  Strengthening;Right;10 reps quad set before lift    Other Supine Knee/Hip Exercises  clam with greenTB holding one LE still moving the opposite - alternating legs x 10 each 2 sets       Cryotherapy   Number Minutes Cryotherapy  15 Minutes    Cryotherapy Location  Knee Rt    Type of Cryotherapy  Ice pack               PT Short Term Goals - 01/23/18 1048      PT SHORT TERM GOAL #1   Title  Increase Rt knee AROM from 0-115 degrees 02/20/18     Time  6    Period  Weeks    Status  Partially Met      PT SHORT TERM GOAL #2   Title  Increase Rt LE strength to 4+/5 in limited planes 02/20/18    Time  6    Period  Weeks    Status  Partially Met      PT SHORT TERM GOAL #3   Title  Improve gait pattern with single point cane as needed for safety for community distances 02/20/18    Time  6    Period  Weeks    Status  Achieved        PT Long Term Goals - 01/23/18 1024      PT LONG TERM GOAL #1   Title  AROM Rt knee 0-120 degrees 04/03/18    Time  12    Period  Weeks    Status  On-going progressing      PT LONG TERM GOAL #2   Title  5-/5 to 5/5 strength bilat LE's 04/03/18    Time  12    Period  Weeks    Status  Partially Met      PT LONG TERM GOAL #3   Title  Independent in gait without assistive device for home/community distances 04/03/18    Time  12    Period  Weeks    Status  Partially Met home without cane, community with cane (if long distances)       PT LONG TERM GOAL #  4   Title  Independent in HEP 04/03/18    Time  12    Period  Weeks    Status  On-going      PT LONG TERM GOAL #5   Title  Improve FOTO to </= 46% limitation 04/03/18     Time  12    Period  Weeks    Status  On-going            Plan - 01/25/18 1519    Clinical Impression Statement  Improving strength and ROM. Mobility and gait pattern continue to improve as well. Patient is ambulating in the home without assistive device and uses the single point cane when out of the home. Safe  without cane, Progressing well toward stated rehab goals.     Rehab Potential  Good    PT Frequency  2x / week    PT Duration  12 weeks    PT Treatment/Interventions  Patient/family education;ADLs/Self Care Home Management;Cryotherapy;Electrical Stimulation;Iontophoresis 43m/ml Dexamethasone;Moist Heat;Ultrasound;Gait training;Functional mobility training;Therapeutic activities;Therapeutic exercise;Balance training;Neuromuscular re-education;Manual techniques;Passive range of motion;Dry needling    PT Next Visit Plan  continue progressive Rt knee ROM and strengthening.  Manual therapy through quads as indicated.  Note to MD     Consulted and Agree with Plan of Care  Patient       Patient will benefit from skilled therapeutic intervention in order to improve the following deficits and impairments:  Postural dysfunction, Improper body mechanics, Pain, Increased fascial restricitons, Increased muscle spasms, Decreased mobility, Decreased range of motion, Decreased strength, Abnormal gait, Decreased balance  Visit Diagnosis: Chronic pain of right knee  Muscle weakness (generalized)  Other abnormalities of gait and mobility     Problem List Patient Active Problem List   Diagnosis Date Noted  . Blister of right heel 01/04/2018  . Primary osteoarthritis of right knee 12/16/2017  . Benign hypertension with CKD (chronic kidney disease) stage III ([place] 12/04/2017  . Primary osteoarthritis involving multiple joints 12/04/2017  . History of echocardiogram 11/07/2017  . CKD (chronic kidney disease) stage 3, GFR 30-59 ml/min (HCC) 11/02/2017  . 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 Gallstones 06/22/2017  . Elevated serum creatinine 06/15/2017  . Elevated alkaline phosphatase level 06/15/2017  . Transaminitis 06/15/2017  . Acquired hypothyroidism 05/26/2017  . Essential hypertension 05/26/2017  . H/O total knee replacement, left 05/26/2017  . History of adrenal insufficiency  05/26/2017  . Rheumatoid arthritis involving multiple sites ([facility] 05/26/2017  . Osteoarthritis of right knee 05/26/2017  . Allergic rhinitis 05/26/2017  . Right carotid bruit 05/26/2017  . Carotid stenosis, asymptomatic, bilateral 05/26/2017  . Allergy to honey bee venom 05/26/2017    Allizon Woznick PNilda SimmerPT, MPH  01/25/2018, 3:57 PM  [facility] [address]Phone: [phone]  Fax:  [phone] Name: Melody LittleMRN: [id number] of Birth: 514-Jul-1952

## 2018-01-26 DIAGNOSIS — M1711 Unilateral primary osteoarthritis, right knee: Secondary | ICD-10-CM | POA: Diagnosis not present

## 2018-01-26 DIAGNOSIS — Z96651 Presence of right artificial knee joint: Secondary | ICD-10-CM | POA: Diagnosis not present

## 2018-01-30 DIAGNOSIS — Z6830 Body mass index (BMI) 30.0-30.9, adult: Secondary | ICD-10-CM | POA: Diagnosis not present

## 2018-01-30 DIAGNOSIS — M353 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: Secondary | ICD-10-CM | POA: Diagnosis not present

## 2018-01-30 DIAGNOSIS — R5383 Other fatigue: Secondary | ICD-10-CM | POA: Diagnosis not present

## 2018-01-30 DIAGNOSIS — E669 Obesity, unspecified: Secondary | ICD-10-CM | POA: Diagnosis not present

## 2018-01-30 DIAGNOSIS — M150 Primary generalized (osteo)arthritis: Secondary | ICD-10-CM | POA: Diagnosis not present

## 2018-01-30 DIAGNOSIS — M069 Rheumatoid arthritis, unspecified: Secondary | ICD-10-CM | POA: Diagnosis not present

## 2018-01-31 ENCOUNTER — Encounter: Payer: Self-pay | Admitting: Rehabilitative and Restorative Service Providers"

## 2018-01-31 ENCOUNTER — Ambulatory Visit: Payer: Medicare HMO | Admitting: Rehabilitative and Restorative Service Providers"

## 2018-01-31 DIAGNOSIS — M6281 Muscle weakness (generalized): Secondary | ICD-10-CM | POA: Diagnosis not present

## 2018-01-31 DIAGNOSIS — M25561 Pain in right knee: Secondary | ICD-10-CM

## 2018-01-31 DIAGNOSIS — G8929 Other chronic pain: Secondary | ICD-10-CM

## 2018-01-31 DIAGNOSIS — R2689 Other abnormalities of gait and mobility: Secondary | ICD-10-CM | POA: Diagnosis not present

## 2018-01-31 NOTE — Therapy (Signed)
Melody Moore, [place], [address] Phone: [phone]   Fax:  [phone]  Physical Therapy Treatment  Patient Details  Name: Melody Moore MRN: [id number] Date of Birth: [birth year] Referring Provider: Dr Dorna Leitz    Encounter Date: 01/31/2018  PT End of Session - 01/31/18 0855    Visit Number  7    Number of Visits  24    Date for PT Re-Evaluation  04/03/18    PT Start Time  0845    PT Stop Time  0943    PT Time Calculation (min)  58 min    Activity Tolerance  Patient tolerated treatment well       Past Medical History:  Diagnosis Date  . Adrenal crisis ([place]) 02/12/2016  . AKI (acute kidney injury) ([place]) 02/2016  . Allergy   . Arthritis   . Carotid stenosis, asymptomatic, bilateral 05/26/2017   Dopplers 02/12/2016: right - mid-distal 40-59% stenosis; left - 40-59% stenosis  . Cataract   . CKD (chronic kidney disease) stage 3, GFR 30-59 ml/min (HCC) 11/02/2017  . Decreased calculated GFR 06/15/2017  . Gallstones 06/22/2017  . History of echocardiogram 11/07/2017   04/09/2016: EF 56%, mild diastolic dysfunction, trivial MR and TR  . Hypertension   . Hypothyroidism   . 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 Rheumatoid arthritis ([place])   . Rheumatoid arthritis involving multiple sites ([place]) 05/26/2017  . Transaminitis 06/15/2017   Normal abdominal US 06/22/17    Past Surgical History:  Procedure Laterality Date  . APPENDECTOMY    . CATARACT EXTRACTION, BILATERAL  2015  . 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  . TOTAL KNEE ARTHROPLASTY Right 12/16/2017   Procedure: RIGHT TOTAL KNEE ARTHROPLASTY;  Surgeon: Dorna Leitz, MD;  Location: [facility];  Service: Orthopedics;  Laterality: Right;  Adductor Block    There were no vitals filed for this visit.  Subjective Assessment - 01/31/18 0856    Subjective  Seen by MD - he was pleased with her progress. Melody Moore is pleased with her progress and  feels she is making great progress. No longer using cane at all. Now allowed to drive.     Currently in Pain?  No/denies         [facility] PT Assessment - 01/31/18 0001      Assessment   Medical Diagnosis  Rt TKA     Referring Provider  Dr Dorna Leitz     Onset Date/Surgical Date  12/16/17    Hand Dominance  Right    Next MD Visit  01/29/18      AROM   Right Knee Extension  -2    Right Knee Flexion  119      Strength   Right Hip Extension  4/5    Right Hip ABduction  4/5                   [facility] Adult PT Treatment/Exercise - 01/31/18 0001      Knee/Hip Exercises: Stretches   Gastroc Stretch  Right;2 reps;30 seconds      Knee/Hip Exercises: Aerobic   Recumbent Bike  L3 x 6 min       Knee/Hip Exercises: Standing   Lateral Step Up  Right;2 sets;10 reps;Hand Hold: 2;Step Height: 6" VC to slow speed; eccentric control.     Forward Step Up  Right;2 sets;10 reps;Hand Hold: 2;Step Height: 6" VC to slow speed; eccentric control.     Wall Squat  10 reps;2 sets large orange ball at back - 5x sec hold     SLS  30 sec x 2 reps with occasional steadying with UE, added blue theracushion 30 sec x 2 each LE    Other Standing Knee Exercises  heel toe; backwards; side steps at counter ~ 10 ft x 5 each; trial of braiding - difficult for patient to coordinate x 1 rep each direction       Knee/Hip Exercises: Seated   Sit to Sand  1 set;10 reps;without UE support eccentric control      Knee/Hip Exercises: Supine   Heel Slides  AAROM;Right;5 reps 5 sec hold w/ strap for assist       Vasopneumatic   Number Minutes Vasopneumatic   15 minutes    Vasopnuematic Location   Knee Rt    Vasopneumatic Pressure  Low    Vasopneumatic Temperature   34 deg                PT Short Term Goals - 01/31/18 0901      PT SHORT TERM GOAL #1   Title  Increase Rt knee AROM from 0-115 degrees 02/20/18     Time  6    Period  Weeks    Status  Partially Met      PT SHORT TERM GOAL #2   Title   Increase Rt LE strength to 4+/5 in limited planes 02/20/18    Time  6    Period  Weeks    Status  Achieved      PT SHORT TERM GOAL #3   Title  Improve gait pattern with single point cane as needed for safety for community distances 02/20/18    Time  6    Period  Weeks    Status  Achieved        PT Long Term Goals - 01/31/18 0901      PT LONG TERM GOAL #1   Title  AROM Rt knee 0-120 degrees 04/03/18    Time  12    Period  Weeks    Status  On-going      PT LONG TERM GOAL #2   Title  5-/5 to 5/5 strength bilat LE's 04/03/18    Time  12    Period  Weeks    Status  Partially Met      PT LONG TERM GOAL #3   Title  Independent in gait without assistive device for home/community distances 04/03/18    Time  12    Period  Weeks    Status  Not Met no longer using cane       PT LONG TERM GOAL #4   Title  Independent in HEP 04/03/18            Plan - 01/31/18 0859    Clinical Impression Statement  Continued improvement with Rt knee rehab - increasing functional activity level; increasing ROM; increasing strength. Excellent progress - working well toward stated goals of therapy.     Rehab Potential  Good    PT Frequency  2x / week    PT Duration  12 weeks    PT Treatment/Interventions  Patient/family education;ADLs/Self Care Home Management;Cryotherapy;Electrical Stimulation;Iontophoresis 59m/ml Dexamethasone;Moist Heat;Ultrasound;Gait training;Functional mobility training;Therapeutic activities;Therapeutic exercise;Balance training;Neuromuscular re-education;Manual techniques;Passive range of motion;Dry needling    PT Next Visit Plan  continue progressive Rt knee ROM and strengthening.  Manual therapy through quads as indicated.     Consulted and Agree with Plan of  Care  Patient       Patient will benefit from skilled therapeutic intervention in order to improve the following deficits and impairments:  Postural dysfunction, Improper body mechanics, Pain, Increased fascial  restricitons, Increased muscle spasms, Decreased mobility, Decreased range of motion, Decreased strength, Abnormal gait, Decreased balance  Visit Diagnosis: Chronic pain of right knee  Muscle weakness (generalized)  Other abnormalities of gait and mobility     Problem List Patient Active Problem List   Diagnosis Date Noted  . Blister of right heel 01/04/2018  . Primary osteoarthritis of right knee 12/16/2017  . Benign hypertension with CKD (chronic kidney disease) stage III ([place]) 12/04/2017  . Primary osteoarthritis involving multiple joints 12/04/2017  . History of echocardiogram 11/07/2017  . CKD (chronic kidney disease) stage 3, GFR 30-59 ml/min (HCC) 11/02/2017  . 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 Gallstones 06/22/2017  . Elevated serum creatinine 06/15/2017  . Elevated alkaline phosphatase level 06/15/2017  . Transaminitis 06/15/2017  . Acquired hypothyroidism 05/26/2017  . Essential hypertension 05/26/2017  . H/O total knee replacement, left 05/26/2017  . History of adrenal insufficiency 05/26/2017  . Rheumatoid arthritis involving multiple sites ([place]) 05/26/2017  . Osteoarthritis of right knee 05/26/2017  . Allergic rhinitis 05/26/2017  . Right carotid bruit 05/26/2017  . Carotid stenosis, asymptomatic, bilateral 05/26/2017  . Allergy to honey bee venom 05/26/2017    Hanson Medeiros Nilda Simmer PT, MPH  01/31/2018, 9:30 AM  [facility] [place] [place], [place], [address] Phone: [phone]   Fax:  [phone]  Name: Kennidi Yoshida MRN: [id number] Date of Birth: 07-08-1951

## 2018-02-01 ENCOUNTER — Other Ambulatory Visit: Payer: Self-pay | Admitting: Physician Assistant

## 2018-02-01 DIAGNOSIS — E039 Hypothyroidism, unspecified: Secondary | ICD-10-CM

## 2018-02-01 NOTE — Telephone Encounter (Signed)
Recommendations left on vm -EH/RMA  

## 2018-02-01 NOTE — Telephone Encounter (Signed)
Let's refill 30 days and have patient get a TSH level drawn. That is my bad. I should have ordered it with her other labs

## 2018-02-01 NOTE — Telephone Encounter (Signed)
Couldn't see where any lab work has been for TSH.  A refill request for levothyroxine pending.  Is this refill appropriate? -EH/RMA

## 2018-02-03 ENCOUNTER — Encounter: Payer: Medicare HMO | Admitting: Physical Therapy

## 2018-02-07 ENCOUNTER — Ambulatory Visit: Payer: Medicare HMO | Admitting: Rehabilitative and Restorative Service Providers"

## 2018-02-07 ENCOUNTER — Encounter: Payer: Self-pay | Admitting: Rehabilitative and Restorative Service Providers"

## 2018-02-07 DIAGNOSIS — R2689 Other abnormalities of gait and mobility: Secondary | ICD-10-CM

## 2018-02-07 DIAGNOSIS — M25561 Pain in right knee: Secondary | ICD-10-CM | POA: Diagnosis not present

## 2018-02-07 DIAGNOSIS — M6281 Muscle weakness (generalized): Secondary | ICD-10-CM

## 2018-02-07 DIAGNOSIS — G8929 Other chronic pain: Secondary | ICD-10-CM | POA: Diagnosis not present

## 2018-02-07 NOTE — Therapy (Addendum)
[facility] [place] [address] [place], [place], [address] Phone: [phone]   Fax:  [phone]  Physical Therapy Treatment  Patient Details  Name: Melody Moore MRN: [id number] Date of Birth: [birth year] Referring Provider: Dr Dorna Leitz    Encounter Date: 02/07/2018  PT End of Session - 02/07/18 0804    Visit Number  8    Number of Visits  24    Date for PT Re-Evaluation  04/03/18    PT Start Time  0758    PT Stop Time  0854    PT Time Calculation (min)  56 min    Activity Tolerance  Patient tolerated treatment well       Past Medical History:  Diagnosis Date  . Adrenal crisis ([place]) 02/12/2016  . AKI (acute kidney injury) ([place]) 02/2016  . Allergy   . Arthritis   . Carotid stenosis, asymptomatic, bilateral 05/26/2017   Dopplers 02/12/2016: right - mid-distal 40-59% stenosis; left - 40-59% stenosis  . Cataract   . CKD (chronic kidney disease) stage 3, GFR 30-59 ml/min (HCC) 11/02/2017  . Decreased calculated GFR 06/15/2017  . Gallstones 06/22/2017  . History of echocardiogram 11/07/2017   04/09/2016: EF 56%, mild diastolic dysfunction, trivial MR and TR  . Hypertension   . Hypothyroidism   . 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 Rheumatoid arthritis ([place])   . Rheumatoid arthritis involving multiple sites ([place]) 05/26/2017  . Transaminitis 06/15/2017   Normal abdominal US 06/22/17    Past Surgical History:  Procedure Laterality Date  . APPENDECTOMY    . CATARACT EXTRACTION, BILATERAL  2015  . 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  . TOTAL KNEE ARTHROPLASTY Right 12/16/2017   Procedure: RIGHT TOTAL KNEE ARTHROPLASTY;  Surgeon: Dorna Leitz, MD;  Location: [facility];  Service: Orthopedics;  Laterality: Right;  Adductor Block    There were no vitals filed for this visit.  Subjective Assessment - 02/07/18 0804    Subjective  Cotinues to improve. Walked a mile yesterday and is very proud of herself. It has been a  long time since she walked that far. She is feeling confident with her exercises and has an exercise room in her housing development which she plans to use on a regular basis.     Currently in Pain?  No/denies         [facility] PT Assessment - 02/07/18 0001      Assessment   Medical Diagnosis  Rt TKA     Referring Provider  Dr Dorna Leitz     Onset Date/Surgical Date  12/16/17    Hand Dominance  Right    Next MD Visit  6/19      Observation/Other Assessments   Focus on Therapeutic Outcomes (FOTO)   38% limitation      AROM   Right Knee Extension  0    Right Knee Flexion  123      Strength   Right Hip Flexion  5/5    Right Hip Extension  -- 5-/5    Right Hip ABduction  4+/5    Right Knee Flexion  5/5    Right Knee Extension  5/5      Flexibility   Hamstrings  tight Rt ~ 85 deg; Lt 85 deg    Quadriceps  Rt knee flexion 102 deg                    [facility] Adult PT Treatment/Exercise - 02/07/18 0001  Knee/Hip Exercises: Stretches   Passive Hamstring Stretch  Right;3 reps;30 seconds supine with strap     Quad Stretch  Right;30 seconds;2 reps prone with strap     Gastroc Stretch  Right;2 reps;30 seconds      Knee/Hip Exercises: Aerobic   Recumbent Bike  L3 x 6 min       Knee/Hip Exercises: Standing   Lateral Step Up  Right;2 sets;10 reps;Hand Hold: 2;Step Height: 6" VC to slow speed; eccentric control.     Forward Step Up  Right;2 sets;10 reps;Hand Hold: 2;Step Height: 6" VC to slow speed; eccentric control.     SLS  30 sec x 2 reps with occasional steadying with UE, added blue theracushion 30 sec x 2 each LE    Other Standing Knee Exercises  heel toe; backwards; side steps at counter ~ 10 ft x 5 each; trial of braiding - difficult for patient to coordinate x 1 rep each direction       Knee/Hip Exercises: Seated   Sit to Sand  1 set;10 reps;without UE support eccentric control      Knee/Hip Exercises: Supine   Heel Slides  AAROM;Right;5 reps 5 sec hold w/  strap for assist     Terminal Knee Extension  Strengthening;AROM;Right;5 reps heel on foam roll to free knee       Knee/Hip Exercises: Sidelying   Hip ABduction  Strengthening;Right;2 sets;10 reps    Clams  with green TB x 30       Vasopneumatic   Number Minutes Vasopneumatic   15 minutes    Vasopnuematic Location   Knee Rt    Vasopneumatic Pressure  Low    Vasopneumatic Temperature   34 deg              PT Education - 02/07/18 0843    Education provided  Yes    Education Details  HEP     Person(s) Educated  Patient    Methods  Explanation;Demonstration;Tactile cues;Verbal cues;Handout    Comprehension  Verbalized understanding;Returned demonstration;Verbal cues required;Tactile cues required       PT Short Term Goals - 01/31/18 0901      PT SHORT TERM GOAL #1   Title  Increase Rt knee AROM from 0-115 degrees 02/20/18     Time  6    Period  Weeks    Status  Partially Met      PT SHORT TERM GOAL #2   Title  Increase Rt LE strength to 4+/5 in limited planes 02/20/18    Time  6    Period  Weeks    Status  Achieved      PT SHORT TERM GOAL #3   Title  Improve gait pattern with single point cane as needed for safety for community distances 02/20/18    Time  6    Period  Weeks    Status  Achieved        PT Long Term Goals - 02/07/18 0816      PT LONG TERM GOAL #1   Title  AROM Rt knee 0-120 degrees 04/03/18    Time  12    Period  Weeks    Status  Achieved      PT LONG TERM GOAL #2   Title  5-/5 to 5/5 strength bilat LE's 04/03/18    Time  12    Period  Weeks    Status  Achieved      PT LONG TERM GOAL #3  Title  Independent in gait without assistive device for home/community distances 04/03/18    Time  12    Period  Weeks    Status  Achieved      PT LONG TERM GOAL #4   Title  Independent in HEP 04/03/18    Time  12    Period  Weeks    Status  Achieved      PT LONG TERM GOAL #5   Title  Improve FOTO to </= 46% limitation 04/03/18     Time  12    Period   Weeks    Status  Achieved            Plan - 02/07/18 5035    Clinical Impression Statement  Excellent progress. Patient will be out of town next week but will continue with HEP during her vacation. She will call to schedule additional appointments following vacation if she feels she needs further treatment. [name] is progressing well and has accomplished most of rehab goals at this time. ROM and strength continue to improve.     Rehab Potential  Good    PT Frequency  2x / week    PT Duration  6 weeks    PT Treatment/Interventions  Patient/family education;ADLs/Self Care Home Management;Cryotherapy;Electrical Stimulation;Iontophoresis 68m/ml Dexamethasone;Moist Heat;Ultrasound;Gait training;Functional mobility training;Therapeutic activities;Therapeutic exercise;Balance training;Neuromuscular re-education;Manual techniques;Passive range of motion;Dry needling    PT Next Visit Plan  patient will be on vacation next week. She will call to schedule additioinal visits as needed when she returns home.     Consulted and Agree with Plan of Care  Patient       Patient will benefit from skilled therapeutic intervention in order to improve the following deficits and impairments:  Postural dysfunction, Improper body mechanics, Pain, Increased fascial restricitons, Increased muscle spasms, Decreased mobility, Decreased range of motion, Decreased strength, Abnormal gait, Decreased balance  Visit Diagnosis: Chronic pain of right knee  Muscle weakness (generalized)  Other abnormalities of gait and mobility     Problem List Patient Active Problem List   Diagnosis Date Noted  . Blister of right heel 01/04/2018  . Primary osteoarthritis of right knee 12/16/2017  . Benign hypertension with CKD (chronic kidney disease) stage III ([place] 12/04/2017  . Primary osteoarthritis involving multiple joints 12/04/2017  . History of echocardiogram 11/07/2017  . CKD (chronic kidney disease) stage 3, GFR 30-59  ml/min (HCC) 11/02/2017  . 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 Gallstones 06/22/2017  . Elevated serum creatinine 06/15/2017  . Elevated alkaline phosphatase level 06/15/2017  . Transaminitis 06/15/2017  . Acquired hypothyroidism 05/26/2017  . Essential hypertension 05/26/2017  . H/O total knee replacement, left 05/26/2017  . History of adrenal insufficiency 05/26/2017  . Rheumatoid arthritis involving multiple sites ([place] 05/26/2017  . Osteoarthritis of right knee 05/26/2017  . Allergic rhinitis 05/26/2017  . Right carotid bruit 05/26/2017  . Carotid stenosis, asymptomatic, bilateral 05/26/2017  . Allergy to honey bee venom 05/26/2017    Eiliana Drone PNilda SimmerPT, MPH  02/07/2018, 8:44 AM  [facility] [address] [address]Phone: [phone]  Fax:  [phone] Name: MDelta PichonMRN: [id number] of Birth: 506/28/1952 PHYSICAL THERAPY DISCHARGE SUMMARY  Visits from Start of Care: 8  Current functional level related to goals / functional outcomes: See last progress note for discharge status   Remaining deficits: Unknown    Education / Equipment HEP   Plan: Patient agrees to  discharge.  Patient goals were met. Patient is being discharged due to meeting the stated rehab goals.  ?????    Odean Mcelwain P. Helene Kelp PT, MPH 03/13/18 3:00 PM

## 2018-02-07 NOTE — Patient Instructions (Addendum)
Strengthening: Hip Abduction (Side-Lying)    Tighten muscles on front of left thigh, then lift leg __10-12__ inches from surface, keeping knee locked.  Repeat _10___ times per set. Do __2-3__ sets per session. Do __1__ sessions per day.    Abduction: Clam (Eccentric) - Side-Lying with green band     Lie on side with knees bent. Lift top knee, keeping feet together. Keep trunk steady. Slowly lower for 3-5 seconds. _10__ reps per set, __2-3_ sets per day, _1 x per day

## 2018-02-22 ENCOUNTER — Ambulatory Visit (INDEPENDENT_AMBULATORY_CARE_PROVIDER_SITE_OTHER): Payer: Medicare HMO | Admitting: Physician Assistant

## 2018-02-22 ENCOUNTER — Other Ambulatory Visit: Payer: Self-pay

## 2018-02-22 ENCOUNTER — Encounter: Payer: Self-pay | Admitting: Physician Assistant

## 2018-02-22 VITALS — BP 138/79 | HR 69 | Wt 190.0 lb

## 2018-02-22 DIAGNOSIS — E039 Hypothyroidism, unspecified: Secondary | ICD-10-CM

## 2018-02-22 DIAGNOSIS — M150 Primary generalized (osteo)arthritis: Secondary | ICD-10-CM

## 2018-02-22 DIAGNOSIS — N183 Chronic kidney disease, stage 3 unspecified: Secondary | ICD-10-CM

## 2018-02-22 DIAGNOSIS — J309 Allergic rhinitis, unspecified: Secondary | ICD-10-CM

## 2018-02-22 DIAGNOSIS — M8949 Other hypertrophic osteoarthropathy, multiple sites: Secondary | ICD-10-CM

## 2018-02-22 DIAGNOSIS — Z0100 Encounter for examination of eyes and vision without abnormal findings: Secondary | ICD-10-CM

## 2018-02-22 DIAGNOSIS — I129 Hypertensive chronic kidney disease with stage 1 through stage 4 chronic kidney disease, or unspecified chronic kidney disease: Secondary | ICD-10-CM

## 2018-02-22 DIAGNOSIS — E785 Hyperlipidemia, unspecified: Secondary | ICD-10-CM | POA: Diagnosis not present

## 2018-02-22 DIAGNOSIS — Z5181 Encounter for therapeutic drug level monitoring: Secondary | ICD-10-CM | POA: Diagnosis not present

## 2018-02-22 DIAGNOSIS — Z79899 Other long term (current) drug therapy: Secondary | ICD-10-CM

## 2018-02-22 DIAGNOSIS — Z13220 Encounter for screening for lipoid disorders: Secondary | ICD-10-CM | POA: Diagnosis not present

## 2018-02-22 DIAGNOSIS — D649 Anemia, unspecified: Secondary | ICD-10-CM

## 2018-02-22 DIAGNOSIS — M159 Polyosteoarthritis, unspecified: Secondary | ICD-10-CM

## 2018-02-22 LAB — POCT UA - MICROALBUMIN
Creatinine, POC: 50 mg/dL
MICROALBUMIN (UR) POC: 10 mg/L

## 2018-02-22 MED ORDER — ACETAMINOPHEN ER 650 MG PO TBCR: 1300.0000 mg | EXTENDED_RELEASE_TABLET | Freq: Two times a day (BID) | ORAL | Status: AC | PRN

## 2018-02-22 MED ORDER — FLUTICASONE PROPIONATE 50 MCG/ACT NA SUSP: 1.0000 | Freq: Every day | NASAL | Status: DC | PRN

## 2018-02-22 MED ORDER — CETIRIZINE HCL 10 MG PO TABS: 10.0000 mg | ORAL_TABLET | Freq: Every day | ORAL | Status: DC | PRN

## 2018-02-22 MED ORDER — OLOPATADINE HCL 0.2 % OP SOLN: 1.0000 [drp] | Freq: Every day | OPHTHALMIC | Status: DC | PRN

## 2018-02-22 NOTE — Progress Notes (Signed)
HPI:                                                                Melody Moore is a 67 y.o. female who presents to [facility]: [place] today for medication management  Melody Moore is doing great. She recently completed PT for her right TKR. She is virtually pain-free and has been able to increase her physical activity. Recently took a family trip to [place] that involved a lot of walking and she did well.  HTN: taking Quinapril 40 mg and Amlodipine 5 mg daily. Compliant with medications. Does not check BP's at home. Denies vision change, headache, chest pain with exertion, orthopnea, lightheadedness, syncope and edema. Risk factors include: age>65, HLD,  Hypothyroidism: takes synthroid daily without issues. Denies chest pain, heart palpitations, tremor, GI symptoms, or skin changes.  HLD: taking Atorvastatin 40 mg daily. Compliant with medications. Denies myalgias.  CKD: taking Quinapril daily. Compliant with medications. Baseline Scr  1.19-1.55.  Requesting referral to Ophthalmology for routine annual eye exam.    No flowsheet data found.    Past Medical History:  Diagnosis Date  . Adrenal crisis ([place]) 02/12/2016  . AKI (acute kidney injury) ([place]) 02/2016  . Allergy   . Arthritis   . Carotid stenosis, asymptomatic, bilateral 05/26/2017   Dopplers 02/12/2016: right - mid-distal 40-59% stenosis; left - 40-59% stenosis  . Cataract   . CKD (chronic kidney disease) stage 3, GFR 30-59 ml/min (HCC) 11/02/2017  . Decreased calculated GFR 06/15/2017  . Gallstones 06/22/2017  . History of echocardiogram 11/07/2017   04/09/2016: EF 94%, mild diastolic dysfunction, trivial MR and TR  . Hypertension   . Hypothyroidism   . 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 Rheumatoid arthritis ([place])   . Rheumatoid arthritis involving multiple sites ([place]) 05/26/2017  . Transaminitis 06/15/2017   Normal abdominal US 06/22/17   Past Surgical History:   Procedure Laterality Date  . APPENDECTOMY    . CATARACT EXTRACTION, BILATERAL  2015  . 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  . TOTAL KNEE ARTHROPLASTY Right 12/16/2017   Procedure: RIGHT TOTAL KNEE ARTHROPLASTY;  Surgeon: Dorna Leitz, MD;  Location: [facility];  Service: Orthopedics;  Laterality: Right;  Adductor Block   Social History   Tobacco Use  . Smoking status: Former Smoker    Types: Cigarettes    Last attempt to quit: 02/07/1974    Years since quitting: 44.0  . Smokeless tobacco: Never Used  Substance Use Topics  . Alcohol use: Yes    Alcohol/week: 0.6 oz    Types: 1 Glasses of wine per week   family history includes Diabetes in her mother; Hypertension in her father; Lung cancer in her father.    ROS: negative except as noted in the HPI  Medications: Current Outpatient Medications  Medication Sig Dispense Refill  . acetaminophen (TYLENOL) 650 MG CR tablet Take 2 tablets (1,300 mg total) by mouth 2 (two) times daily as needed for pain.    Marland Kitchen amLODipine (NORVASC) 5 MG tablet Take 1 tablet (5 mg total) by mouth daily. 30 tablet 5  . aspirin EC 81 MG tablet Take 81 mg by mouth daily.    Marland Kitchen atorvastatin (LIPITOR) 40 MG tablet Take 1 tablet (40 mg  total) by mouth at bedtime. 90 tablet 0  . Calcium Carb-Cholecalciferol (CALCIUM 600 + D PO) Take 2 tablets by mouth daily.    . cetirizine (ZYRTEC) 10 MG tablet Take 1 tablet (10 mg total) by mouth daily as needed for allergies.    . Cholecalciferol (VITAMIN D) 2000 units tablet Take 2,000 Units by mouth daily.    Marland Kitchen docusate sodium (COLACE) 100 MG capsule Take 100 mg by mouth 2 (two) times daily.    Marland Kitchen EPINEPHrine (EPIPEN 2-PAK) 0.3 mg/0.3 mL IJ SOAJ injection Inject 0.3 mLs (0.3 mg total) into the muscle once as needed. (Patient taking differently: Inject 0.3 mg into the muscle once as needed (bee stings). ) 2 Device 1  . ferrous sulfate 325 (65 FE) MG tablet Take 325 mg by mouth 2 (two) times daily.    . fluticasone (FLONASE) 50  MCG/ACT nasal spray Place 1 spray into both nostrils daily as needed for allergies.    Marland Kitchen gabapentin (NEURONTIN) 300 MG capsule TAKE 1 CAPSULE BY MOUTH EVERYDAY AT BEDTIME 90 capsule 0  . hydroxychloroquine (PLAQUENIL) 200 MG tablet Take 200 mg by mouth 2 (two) times daily.     Marland Kitchen leucovorin (WELLCOVORIN) 5 MG tablet Take 5 mg by mouth daily. Except for Saturday/Sunday    . levothyroxine (SYNTHROID, LEVOTHROID) 50 MCG tablet Take 1 tablet (50 mcg total) by mouth daily before breakfast. TSH level needed for add'l refills 30 tablet 0  . methotrexate (RHEUMATREX) 5 MG tablet Take 5 mg by mouth once a week. Caution: Chemotherapy. Protect from light.    . Olopatadine HCl 0.2 % SOLN Place 1 drop into both eyes daily as needed (allergies).    . ondansetron (ZOFRAN) 4 MG tablet     . quinapril (ACCUPRIL) 40 MG tablet TAKE 1 TABLET BY MOUTH EVERY DAY 90 tablet 0  . traZODone (DESYREL) 100 MG tablet Take 100 mg by mouth at bedtime.     No current facility-administered medications for this visit.    Allergies  Allergen Reactions  . Bee Venom Anaphylaxis  . Oxycodone Other (See Comments)    Adrenal crisis while taking both prednisone and oxycodone   . Prednisone     Adrenal crisis while taking both prednisone and oxycodone   . Verapamil Hives  . Nickel Rash  . Nitrofuran Derivatives Rash       Objective:  BP 138/79   Pulse 69   Wt 190 lb (86.2 kg)   BMI 29.76 kg/m  Gen:  alert, not ill-appearing, no distress, appropriate for age 58: head normocephalic without obvious abnormality, conjunctiva and cornea clear, trachea midline Pulm: Normal work of breathing, normal phonation, clear to auscultation bilaterally, no wheezes, rales or rhonchi CV: Normal rate, regular rhythm, s1 and s2 distinct, no murmurs, clicks or rubs, radial pulses 2+ symmetric Neuro: alert and oriented x 3, no tremor MSK: extremities atraumatic, well-healed longitudinal surgical incision of right knee, normal gait and  station, trace peripheral edema Skin: intact, no rashes on exposed skin, no jaundice, no cyanosis Psych: well-groomed, cooperative, good eye contact, euthymic mood, affect mood-congruent, speech is articulate, and thought processes clear and goal-directed    Results for orders placed or performed in visit on 02/22/18 (from the past 72 hour(s))  POCT UA - Microalbumin     Status: Abnormal   Collection Time: 02/22/18  9:27 AM  Result Value Ref Range   Microalbumin Ur, POC 10 mg/L   Creatinine, POC 50 mg/dL   Albumin/Creatinine Ratio, Urine, POC  30-300    No results found.    Assessment and Plan: 67 y.o. female with   Benign hypertension with chronic kidney disease, stage III (HCC)  CKD (chronic kidney disease) stage 3, GFR 30-59 ml/min (HCC) - Plan: COMPLETE METABOLIC PANEL WITH GFR, POCT UA - Microalbumin  Normocytic anemia - Plan: CBC  Acquired hypothyroidism  Encounter for monitoring statin therapy - Plan: Lipid Panel w/reflex Direct LDL  Dyslipidemia - Plan: Lipid Panel w/reflex Direct LDL  Eye exam, routine - Plan: Ambulatory referral to Ophthalmology  Allergic rhinitis, unspecified seasonality, unspecified trigger - Plan: fluticasone (FLONASE) 50 MCG/ACT nasal spray, cetirizine (ZYRTEC) 10 MG tablet  Primary osteoarthritis involving multiple joints - Plan: acetaminophen (TYLENOL) 650 MG CR tablet  CKD Stage 3 - creatinines are stable. Avoiding nephrotoxins.  - rechecking CMP w/GFR - cont Quinapril 40 mg - albuminuria 10 mg/L on UA today. Trend Q6M Lab Results  Component Value Date   CREATININE 1.01 (H) 12/17/2017   HTN BP Readings from Last 3 Encounters:  02/22/18 138/79  01/04/18 125/76  12/18/17 (!) 98/51  - BP at goal <140/90 - cont ACE and CCB  HLD - fasting lipids today - goal LDL<100 - cont statin  Anemia - normocytic, likely ACD associated with CKD - re-checking CBC. Trend Q6M  Patient education and anticipatory guidance given Patient  agrees with treatment plan Follow-up in 3 months for medication management or sooner as needed if symptoms worsen or fail to improve  Darlyne Russian PA-C

## 2018-02-22 NOTE — Patient Instructions (Signed)
[facility] [place], [place] [address] [email or web address] [phone]   Chronic Kidney Disease, Adult Chronic kidney disease (CKD) occurs when the kidneys become damaged slowly over a long period of time. The kidneys are a pair of organs that do many important jobs in the body, including:  Removing waste and extra fluid from the blood to make urine.  Making hormones that maintain the amount of fluid in tissues and blood vessels.  Maintaining the right amount of fluids and chemicals in the body.  A small amount of kidney damage may not cause problems, but a large amount of damage may make it hard or impossible for the kidneys to work the way they should. If steps are not taken to slow down kidney damage or to stop it from getting worse, the kidneys may stop working permanently (end-stage renal disease or ESRD). Most of the time, CKD does not go away, but it can often be controlled. People who have CKD are usually able to live normal lives. What are the causes? The most common causes of this condition are diabetes and high blood pressure (hypertension). Other causes include:  Heart and blood vessel (cardiovascular) disease.  Kidney diseases, such as: ? Glomerulonephritis. ? Interstitial nephritis. ? Polycystic kidney disease. ? Renal vascular disease.  Diseases that affect the immune system.  Genetic diseases.  Medicines that damage the kidneys, such as anti-inflammatory medicines.  Being around or being in contact with poisonous (toxic) substances.  A kidney or urinary infection that occurs again and again (recurs).  Vasculitis. This is swelling or inflammation of the blood vessels.  A problem with urine flow that may be caused by: ? Cancer. ? Having kidney stones more than one time. ? An enlarged prostate, in males.  What increases the risk? You are more likely to develop this condition if you:  Are older than age 69.  Are  female.  Are African-American, Hispanic, Asian, [place], or American [place].  Are a current or former smoker.  Are obese.  Have a family history of kidney disease or failure.  Often take medicines that are damaging to the kidneys.  What are the signs or symptoms? Symptoms of this condition include:  Swelling (edema) of the face, legs, ankles, or feet.  Tiredness (lethargy) and having less energy.  Nausea or vomiting.  Confusion or trouble concentrating.  Problems with urination, such as: ? Painful or burning feeling during urination. ? Decreased urine production. ? Frequent urination, especially at night. ? Bloody urine.  Muscle twitches and cramps, especially in the legs.  Shortness of breath.  Weakness.  Loss of appetite.  Metallic taste in the mouth.  Trouble sleeping.  Dry, itchy skin.  A low blood count (anemia).  Pale lining of the eyelids and surface of the eye (conjunctiva).  Symptoms develop slowly and may not be obvious until the kidney damage becomes severe. It is possible to have kidney disease for years without having any symptoms. How is this diagnosed? This condition may be diagnosed based on:  Blood tests.  Urine tests.  Imaging tests, such as an ultrasound or CT scan.  A test in which a sample of tissue is removed from the kidneys to be examined under a microscope (kidney biopsy).  These test results will help your health care provider determine how serious the CKD is. How is this treated? There is no cure for most cases of this condition, but treatment usually relieves symptoms and prevents or slows the progression of  the disease. Treatment may include:  Making diet changes, which may require you to avoid alcohol, salty foods (sodium), and foods that are high in potassium, calcium, and protein.  Medicines: ? To lower blood pressure. ? To control blood glucose. ? To relieve anemia. ? To relieve swelling. ? To protect your  bones. ? To improve the balance of electrolytes in your blood.  Removing toxic waste from the body through types of dialysis, if the kidneys can no longer do their job (kidney failure).  Managing any other conditions that are causing your CKD or making it worse.  Follow these instructions at home: Medicines  Take over-the-counter and prescription medicines only as told by your health care provider. The dose of some medicines that you take may need to be adjusted.  Do not take any new medicines unless approved by your health care provider. Many medicines can worsen your kidney damage.  Do not take any vitamin and mineral supplements unless approved by your health care provider. Many nutritional supplements can worsen your kidney damage. General instructions  Follow your prescribed diet as told by your health care provider.  Do not use any products that contain nicotine or tobacco, such as cigarettes and e-cigarettes. If you need help quitting, ask your health care provider.  Monitor and track your blood pressure at home. Report changes in your blood pressure as told by your health care provider.  If you are being treated for diabetes, monitor and track your blood sugar (blood glucose) levels as told by your health care provider.  Maintain a healthy weight. If you need help with this, ask your health care provider.  Start or continue an exercise plan. Exercise at least 30 minutes a day, 5 days a week.  Keep your immunizations up to date as told by your health care provider.  Keep all follow-up visits as told by your health care provider. This is important. Where to find more information:  American Association of Kidney Patients: BombTimer.gl  National Kidney Foundation: www.kidney.[place]: https://mathis.com/  [facility]: www.lifeoptions.org and www.kidneyschool.org Contact a health care provider if:  Your symptoms get worse.  You  develop new symptoms. Get help right away if:  You develop symptoms of ESRD, which include: ? Headaches. ? Numbness in the hands or feet. ? Easy bruising. ? Frequent hiccups. ? Chest pain. ? Shortness of breath. ? Lack of menstruation, in women.  You have a fever.  You have decreased urine production.  You have pain or bleeding when you urinate. Summary  Chronic kidney disease (CKD) occurs when the kidneys become damaged slowly over a long period of time.  The most common causes of this condition are diabetes and high blood pressure (hypertension).  There is no cure for most cases of this condition, but treatment usually relieves symptoms and prevents or slows the progression of the disease. Treatment may include a combination of medicines and lifestyle changes. This information is not intended to replace advice given to you by your health care provider. Make sure you discuss any questions you have with your health care provider. Document Released: 06/29/2008 Document Revised: 10/28/2016 Document Reviewed: 10/28/2016 Elsevier Interactive Patient Education  [employer].

## 2018-02-23 ENCOUNTER — Other Ambulatory Visit: Payer: Self-pay | Admitting: Physician Assistant

## 2018-02-23 LAB — CBC
HCT: 36.2 % (ref 35.0–45.0)
Hemoglobin: 12 g/dL (ref 11.7–15.5)
MCH: 30.4 pg (ref 27.0–33.0)
MCHC: 33.1 g/dL (ref 32.0–36.0)
MCV: 91.6 fL (ref 80.0–100.0)
MPV: 10.6 fL (ref 7.5–12.5)
PLATELETS: 274 10*3/uL (ref 140–400)
RBC: 3.95 10*6/uL (ref 3.80–5.10)
RDW: 13.6 % (ref 11.0–15.0)
WBC: 6.1 10*3/uL (ref 3.8–10.8)

## 2018-02-23 LAB — LIPID PANEL W/REFLEX DIRECT LDL
Cholesterol: 150 mg/dL (ref <200)
HDL: 78 mg/dL (ref >50)
LDL CHOLESTEROL (CALC): 55 mg/dL
Non-HDL Cholesterol (Calc): 72 mg/dL (calc) (ref <130)
TRIGLYCERIDES: 85 mg/dL (ref <150)
Total CHOL/HDL Ratio: 1.9 (calc) (ref <5.0)

## 2018-02-23 LAB — COMPLETE METABOLIC PANEL WITH GFR
AG RATIO: 1.7 (calc) (ref 1.0–2.5)
ALT: 10 U/L (ref 6–29)
AST: 17 U/L (ref 10–35)
Albumin: 4 g/dL (ref 3.6–5.1)
Alkaline phosphatase (APISO): 77 U/L (ref 33–130)
BILIRUBIN TOTAL: 0.6 mg/dL (ref 0.2–1.2)
BUN/Creatinine Ratio: 16 (calc) (ref 6–22)
BUN: 21 mg/dL (ref 7–25)
CHLORIDE: 104 mmol/L (ref 98–110)
CO2: 26 mmol/L (ref 20–32)
Calcium: 9.6 mg/dL (ref 8.6–10.4)
Creat: 1.28 mg/dL — ABNORMAL HIGH (ref 0.50–0.99)
GFR, EST AFRICAN AMERICAN: 49 mL/min/{1.73_m2} — AB (ref >=60)
GFR, Est Non African American: 43 mL/min/{1.73_m2} — ABNORMAL LOW (ref >=60)
GLUCOSE: 93 mg/dL (ref 65–99)
Globulin: 2.4 g/dL (calc) (ref 1.9–3.7)
POTASSIUM: 4.2 mmol/L (ref 3.5–5.3)
Sodium: 139 mmol/L (ref 135–146)
Total Protein: 6.4 g/dL (ref 6.1–8.1)

## 2018-02-23 LAB — TSH: TSH: 0.68 m[IU]/L (ref 0.40–4.50)

## 2018-02-23 MED ORDER — LEVOTHYROXINE SODIUM 50 MCG PO TABS: 50.0000 ug | ORAL_TABLET | Freq: Every day | ORAL | 3 refills | Status: DC

## 2018-02-23 NOTE — Progress Notes (Signed)
TSH is in a therapeutic range. Continue current dose of Synthroid 50 mcg daily before breakfast. Recheck TSH in 1 year

## 2018-02-23 NOTE — Progress Notes (Signed)
Anemia has resolved Kidney function is stable Cholesterol is at goal

## 2018-02-24 ENCOUNTER — Telehealth: Payer: Self-pay

## 2018-02-24 NOTE — Telephone Encounter (Signed)
Sounds like a pharmacy issue. Tell her to pick an alternative pharmacy

## 2018-02-26 ENCOUNTER — Other Ambulatory Visit: Payer: Self-pay | Admitting: Physician Assistant

## 2018-02-26 DIAGNOSIS — J309 Allergic rhinitis, unspecified: Secondary | ICD-10-CM

## 2018-03-23 DIAGNOSIS — M25561 Pain in right knee: Secondary | ICD-10-CM | POA: Diagnosis not present

## 2018-03-28 ENCOUNTER — Other Ambulatory Visit: Payer: Self-pay | Admitting: Physician Assistant

## 2018-04-01 ENCOUNTER — Other Ambulatory Visit: Payer: Self-pay | Admitting: Physician Assistant

## 2018-04-24 ENCOUNTER — Other Ambulatory Visit: Payer: Self-pay | Admitting: Physician Assistant

## 2018-05-12 DIAGNOSIS — M069 Rheumatoid arthritis, unspecified: Secondary | ICD-10-CM | POA: Diagnosis not present

## 2018-05-12 DIAGNOSIS — E669 Obesity, unspecified: Secondary | ICD-10-CM | POA: Diagnosis not present

## 2018-05-12 DIAGNOSIS — M150 Primary generalized (osteo)arthritis: Secondary | ICD-10-CM | POA: Diagnosis not present

## 2018-05-12 DIAGNOSIS — M79671 Pain in right foot: Secondary | ICD-10-CM | POA: Diagnosis not present

## 2018-05-12 DIAGNOSIS — M353 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: Secondary | ICD-10-CM | POA: Diagnosis not present

## 2018-05-12 DIAGNOSIS — G629 Polyneuropathy, unspecified: Secondary | ICD-10-CM | POA: Diagnosis not present

## 2018-05-12 DIAGNOSIS — M79672 Pain in left foot: Secondary | ICD-10-CM | POA: Diagnosis not present

## 2018-05-12 DIAGNOSIS — Z6832 Body mass index (BMI) 32.0-32.9, adult: Secondary | ICD-10-CM | POA: Diagnosis not present

## 2018-05-12 DIAGNOSIS — R5383 Other fatigue: Secondary | ICD-10-CM | POA: Diagnosis not present

## 2018-05-18 ENCOUNTER — Other Ambulatory Visit: Payer: Self-pay | Admitting: Physician Assistant

## 2018-05-18 DIAGNOSIS — N183 Chronic kidney disease, stage 3 unspecified: Secondary | ICD-10-CM

## 2018-05-18 DIAGNOSIS — I129 Hypertensive chronic kidney disease with stage 1 through stage 4 chronic kidney disease, or unspecified chronic kidney disease: Secondary | ICD-10-CM

## 2018-05-24 ENCOUNTER — Ambulatory Visit (INDEPENDENT_AMBULATORY_CARE_PROVIDER_SITE_OTHER): Payer: Medicare HMO

## 2018-05-24 ENCOUNTER — Encounter: Payer: Self-pay | Admitting: Physician Assistant

## 2018-05-24 ENCOUNTER — Ambulatory Visit (INDEPENDENT_AMBULATORY_CARE_PROVIDER_SITE_OTHER): Payer: Medicare HMO | Admitting: Physician Assistant

## 2018-05-24 VITALS — BP 165/93 | HR 78 | Wt 201.0 lb

## 2018-05-24 DIAGNOSIS — Z79899 Other long term (current) drug therapy: Secondary | ICD-10-CM

## 2018-05-24 DIAGNOSIS — N183 Chronic kidney disease, stage 3 unspecified: Secondary | ICD-10-CM

## 2018-05-24 DIAGNOSIS — H6122 Impacted cerumen, left ear: Secondary | ICD-10-CM | POA: Diagnosis not present

## 2018-05-24 DIAGNOSIS — Z1231 Encounter for screening mammogram for malignant neoplasm of breast: Secondary | ICD-10-CM

## 2018-05-24 DIAGNOSIS — I129 Hypertensive chronic kidney disease with stage 1 through stage 4 chronic kidney disease, or unspecified chronic kidney disease: Secondary | ICD-10-CM | POA: Diagnosis not present

## 2018-05-24 DIAGNOSIS — I10 Essential (primary) hypertension: Secondary | ICD-10-CM | POA: Insufficient documentation

## 2018-05-24 DIAGNOSIS — S99912A Unspecified injury of left ankle, initial encounter: Secondary | ICD-10-CM

## 2018-05-24 DIAGNOSIS — Z13820 Encounter for screening for osteoporosis: Secondary | ICD-10-CM

## 2018-05-24 DIAGNOSIS — D649 Anemia, unspecified: Secondary | ICD-10-CM | POA: Diagnosis not present

## 2018-05-24 DIAGNOSIS — M25572 Pain in left ankle and joints of left foot: Secondary | ICD-10-CM | POA: Diagnosis not present

## 2018-05-24 DIAGNOSIS — D84821 Immunodeficiency due to drugs: Secondary | ICD-10-CM

## 2018-05-24 DIAGNOSIS — S99922A Unspecified injury of left foot, initial encounter: Secondary | ICD-10-CM | POA: Diagnosis not present

## 2018-05-24 DIAGNOSIS — M7989 Other specified soft tissue disorders: Secondary | ICD-10-CM | POA: Diagnosis not present

## 2018-05-24 NOTE — Patient Instructions (Signed)
Ankle Sprain An ankle sprain is a stretch or tear in one of the tough, fiber-like tissues (ligaments) in the ankle. The ligaments in your ankle help to hold the bones of the ankle together. What are the causes? This condition is often caused by stepping on or falling on the outer edge of the foot. What increases the risk? This condition is more likely to develop in people who play sports. What are the signs or symptoms? Symptoms of this condition include:  Pain in your ankle.  Swelling.  Bruising. Bruising may develop right after you sprain your ankle or 1-2 days later.  Trouble standing or walking, especially when you turn or change directions.  How is this diagnosed? This condition is diagnosed with a physical exam. During the exam, your health care provider will press on certain parts of your foot and ankle and try to move them in certain ways. X-rays may be taken to see how severe the sprain is and to check for broken bones. How is this treated? This condition may be treated with:  A brace. This is used to keep the ankle from moving until it heals.  An elastic bandage. This is used to support the ankle.  Crutches.  Pain medicine.  Surgery. This may be needed if the sprain is severe.  Physical therapy. This may help to improve the range of motion in the ankle.  Follow these instructions at home:  Rest your ankle.  Take over-the-counter and prescription medicines only as told by your health care provider.  For 2-3 days, keep your ankle raised (elevated) above the level of your heart as much as possible.  If directed, apply ice to the area: ? Put ice in a plastic bag. ? Place a towel between your skin and the bag. ? Leave the ice on for 20 minutes, 2-3 times a day.  If you were given a brace: ? Wear it as directed. ? Remove it to shower or bathe. ? Try not to move your ankle much, but wiggle your toes from time to time. This helps to prevent swelling.  If you were  given an elastic bandage (dressing): ? Remove it to shower or bathe. ? Try not to move your ankle much, but wiggle your toes from time to time. This helps to prevent swelling. ? Adjust the dressing to make it more comfortable if it feels too tight. ? Loosen the dressing if you have numbness or tingling in your foot, or if your foot becomes cold and blue.  If you have crutches, use them as told by your health care provider. Continue to use them until you can walk without feeling pain in your ankle. Contact a health care provider if:  You have rapidly increasing bruising or swelling.  Your pain is not relieved with medicine. Get help right away if:  Your toes or foot becomes numb or blue.  You have severe pain that gets worse. This information is not intended to replace advice given to you by your health care provider. Make sure you discuss any questions you have with your health care provider. Document Released: 09/20/2005 Document Revised: 10/28/2016 Document Reviewed: 04/22/2015 Elsevier Interactive Patient Education  2018 Elsevier Inc.  

## 2018-05-24 NOTE — Progress Notes (Signed)
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

## 2018-05-24 NOTE — Progress Notes (Signed)
HPI:                                                                Melody Moore is a 67 y.o. female who presents to [facility] [place] today for medication management  HTN: taking Quinapril 40 mg and Amlodipine 5 mg daily. Compliant with medications. Check BP's at home. BP range 400'Q/67'Y, highest systolic 195, lowest diastolic 54. Denies vision change, headache, chest pain with exertion, orthopnea, lightheadedness, syncope and edema. Risk factors include: age>65, HLD,  Hypothyroidism: takes synthroid daily without issues. Denies chest pain, heart palpitations, tremor, GI symptoms, or skin changes.   CKD: taking Quinapril daily. Compliant with medications. Baseline Scr  1.19-1.55.  RA: followed by Rheum. Methotrexate increased to 7 tabs weekly.  Left lateral ankle pain x 1 week. States she rolled her ankle on a rock while walking in a public garden. She did not fall. She was able to bear weight immediately after. Reports pain initially improved with rest/ice, but has been gradually worsening. Pain is moderate, persistent, worse with ambulation and worse at night.   Also complains of mild hearing loss and fullness in bilateral ears. Denies otalgia or otorrhea. Has a history of wax build-up.  Depression screen [facility] 2/9 05/24/2018 11/25/2017 05/26/2017 05/26/2017  Decreased Interest 0 2 0 0  Down, Depressed, Hopeless 0 1 0 0  PHQ - 2 Score 0 3 0 0  Altered sleeping - 0 - -  Tired, decreased energy - 2 - -  Change in appetite - 0 - -  Feeling bad or failure about yourself  - 0 - -  Trouble concentrating - 0 - -  Moving slowly or fidgety/restless - 0 - -  Suicidal thoughts - 0 - -  PHQ-9 Score - 5 - -  Difficult doing work/chores - Somewhat difficult - -    GAD 7 : Generalized Anxiety Score 05/24/2018  Nervous, Anxious, on Edge 0  Control/stop worrying 0  Worry too much - different things 0  Trouble relaxing 0  Restless 0  Easily  annoyed or irritable 0  Afraid - awful might happen 0  Total GAD 7 Score 0      Past Medical History:  Diagnosis Date  . 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 AKI (acute kidney injury) ([place]) 02/2016  . Allergy   . Arthritis   . Carotid stenosis, asymptomatic, bilateral 05/26/2017   Dopplers 02/12/2016: right - mid-distal 40-59% stenosis; left - 40-59% stenosis  . Cataract   . CKD (chronic kidney disease) stage 3, GFR 30-59 ml/min (HCC) 11/02/2017  . Decreased calculated GFR 06/15/2017  . Gallstones 06/22/2017  . History of echocardiogram 11/07/2017   04/09/2016: EF 09%, mild diastolic dysfunction, trivial MR and TR  . Hypertension   . Hypothyroidism   . 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 Rheumatoid arthritis ([place])   . Rheumatoid arthritis involving multiple sites ([place]) 05/26/2017  . Transaminitis 06/15/2017   Normal abdominal US 06/22/17   Past Surgical History:  Procedure Laterality Date  . APPENDECTOMY    . CATARACT EXTRACTION, BILATERAL  2015  . 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  . TOTAL KNEE ARTHROPLASTY Right 12/16/2017   Procedure: RIGHT TOTAL KNEE ARTHROPLASTY;  Surgeon: Dorna Leitz, MD;  Location: [facility];  Service: Orthopedics;  Laterality: Right;  Adductor Block   Social History   Tobacco Use  . Smoking status: Former Smoker    Types: Cigarettes    Last attempt to quit: 02/07/1974    Years since quitting: 44.3  . Smokeless tobacco: Never Used  Substance Use Topics  . Alcohol use: Yes    Alcohol/week: 1.0 standard drinks    Types: 1 Glasses of wine per week   family history includes Diabetes in her mother; Hypertension in her father; Lung cancer in her father.    ROS: negative except as noted in the HPI  Medications: Current Outpatient Medications  Medication Sig Dispense Refill  . acetaminophen (TYLENOL) 650 MG CR tablet Take 2 tablets (1,300 mg total) by mouth 2 (two) times daily as needed for pain.    Marland Kitchen amLODipine (NORVASC) 5 MG tablet Take 1  tablet (5 mg total) by mouth daily. Due for follow up visit 30 tablet 0  . aspirin EC 81 MG tablet Take 81 mg by mouth daily.    Marland Kitchen atorvastatin (LIPITOR) 40 MG tablet TAKE 1 TABLET BY MOUTH EVERYDAY AT BEDTIME 90 tablet 1  . Calcium Carb-Cholecalciferol (CALCIUM 600 + D PO) Take 2 tablets by mouth daily.    . cetirizine (ZYRTEC) 10 MG tablet Take 1 tablet (10 mg total) by mouth daily as needed for allergies.    . Cholecalciferol (VITAMIN D) 2000 units tablet Take 2,000 Units by mouth daily.    Marland Kitchen docusate sodium (COLACE) 100 MG capsule Take 100 mg by mouth 2 (two) times daily.    Marland Kitchen EPINEPHrine (EPIPEN 2-PAK) 0.3 mg/0.3 mL IJ SOAJ injection Inject 0.3 mLs (0.3 mg total) into the muscle once as needed. (Patient taking differently: Inject 0.3 mg into the muscle once as needed (bee stings). ) 2 Device 1  . ferrous sulfate 325 (65 FE) MG tablet Take 325 mg by mouth 2 (two) times daily.    . fluticasone (FLONASE) 50 MCG/ACT nasal spray Place 1 spray into both nostrils daily as needed for allergies.    . fluticasone (FLONASE) 50 MCG/ACT nasal spray SPRAY 1 SPRAY INTO EACH NOSTRIL EVERY DAY 16 g 1  . gabapentin (NEURONTIN) 300 MG capsule TAKE 1 CAPSULE BY MOUTH EVERYDAY AT BEDTIME 90 capsule 0  . hydroxychloroquine (PLAQUENIL) 200 MG tablet Take 200 mg by mouth 2 (two) times daily.     Marland Kitchen leucovorin (WELLCOVORIN) 5 MG tablet Take 5 mg by mouth every Monday, Tuesday, Wednesday, Thursday, and Friday. Except for Saturday/Sunday    . levothyroxine (SYNTHROID, LEVOTHROID) 50 MCG tablet Take 1 tablet (50 mcg total) by mouth daily before breakfast. 90 tablet 3  . Olopatadine HCl 0.2 % SOLN Place 1 drop into both eyes daily as needed (allergies).    . ondansetron (ZOFRAN) 4 MG tablet     . quinapril (ACCUPRIL) 40 MG tablet TAKE 1 TABLET BY MOUTH EVERY DAY 90 tablet 0  . traZODone (DESYREL) 100 MG tablet TAKE 1 TABLET BY MOUTH EVERYDAY AT BEDTIME 90 tablet 0  . methotrexate (RHEUMATREX) 2.5 MG tablet Take 7 tablets  by mouth once a week.     No current facility-administered medications for this visit.    Allergies  Allergen Reactions  . Bee Venom Anaphylaxis  . Oxycodone Other (See Comments)    Adrenal crisis while taking both prednisone and oxycodone   . Prednisone     Adrenal crisis while taking both prednisone and oxycodone   . Verapamil Hives  .  Nickel Rash  . Nitrofuran Derivatives Rash       Objective:  BP (!) 165/93   Pulse 78   Wt 201 lb (91.2 kg)   BMI 31.48 kg/m  Gen:  alert, not ill-appearing, no distress, appropriate for age, obese female HEENT: head normocephalic without obvious abnormality, conjunctiva and cornea clear, trachea midline Pulm: Normal work of breathing, normal phonation, clear to auscultation bilaterally, no wheezes, rales or rhonchi CV: Normal rate, regular rhythm, s1 and s2 distinct, no murmurs, clicks or rubs  Neuro: alert and oriented x 3, no tremor MSK: extremities atraumatic, normal gait and station, trace peripheral edema bilaterally Left ankle: swelling over the lateral malleolus, positive squeeze test, tenderness over the mid-shaft of the 5th metatarsal Skin: intact, no rashes on exposed skin, no jaundice, no cyanosis Psych: well-groomed, cooperative, good eye contact, euthymic mood, affect mood-congruent, speech is articulate, and thought processes clear and goal-directed  Lab Results  Component Value Date   WBC 6.1 02/22/2018   HGB 12.0 02/22/2018   HCT 36.2 02/22/2018   MCV 91.6 02/22/2018   PLT 274 02/22/2018   Lab Results  Component Value Date   CREATININE 1.28 (H) 02/22/2018   BUN 21 02/22/2018   NA 139 02/22/2018   K 4.2 02/22/2018   CL 104 02/22/2018   CO2 26 02/22/2018   Lab Results  Component Value Date   TSH 0.68 02/22/2018   Procedure: manual debridement of cerumen Indication: hearing loss due to cerumen impaction Verbal consent obtain Manual debridement was succesfully performed with lighted curette on the right ear  canal. Unable to debride left ear canal due to degree of impaction Patient tolerated the procedure without any complications.    No results found for this or any previous visit (from the past 72 hour(s)). No results found.    Assessment and Plan: 67 y.o. female with   .Rim was seen today for medication management.  Diagnoses and all orders for this visit:  Encounter for long-term (current) use of medications  Breast cancer screening by mammogram -     MM 3D SCREEN BREAST BILATERAL; Future  CKD (chronic kidney disease) stage 3, GFR 30-59 ml/min (HCC)  Benign hypertension with chronic kidney disease, stage III (HCC)  Immunosuppression due to drug therapy  Normocytic anemia  Osteoporosis screening -     DG Bone Density  Left ankle injury, initial encounter -     DG Ankle Complete Left -     DG Foot Complete Left  Impacted cerumen of left ear Comments: return for left ear irrigation nurse visit    Elevated BP, HTN BP Readings from Last 3 Encounters:  05/24/18 (!) 165/93  02/22/18 138/79  01/04/18 125/76  - BP out of range on two office checks today - home BP is in range - cont Quinapril 40 mg and Amlodipine 5 mg  CKD - stable, Baseline Scr  1.19-1.55 - recheck BMP and urine microalbumin in 3 months - rheumatology is also monitoring due to Methotrexate  Left ankle injury - X-ray foot and ankle pending - patient placed in ASO brace - avoiding NSAIDs due to Methotrexate use - RICE protocol, Tylenol 1000 mg Q8H prn for pain - follow-up with Sports Medicine in 2 weeks  Patient education and anticipatory guidance given Patient agrees with treatment plan Follow-up in 3 months for medication management or sooner as needed if symptoms worsen or fail to improve  Darlyne Russian PA-C

## 2018-05-29 ENCOUNTER — Ambulatory Visit (INDEPENDENT_AMBULATORY_CARE_PROVIDER_SITE_OTHER): Payer: Medicare HMO | Admitting: Physician Assistant

## 2018-05-29 VITALS — BP 139/84 | HR 78

## 2018-05-29 DIAGNOSIS — H6122 Impacted cerumen, left ear: Secondary | ICD-10-CM | POA: Diagnosis not present

## 2018-05-29 NOTE — Progress Notes (Signed)
Procedure: Left ear irrigation Indication: partial hearing loss / discomfort due to cerumen impaction Verbal consent obtain Left ear canal soaked with Colace for 15 minutes Ear irrigated with peroxide/H2O by Izell [place], CMA.  Manual debridement was not indicated Patient tolerated the procedure without any complications. Post-irrigation examination shows cerumen was completely removed.

## 2018-05-30 ENCOUNTER — Encounter: Payer: Self-pay | Admitting: Physician Assistant

## 2018-06-01 DIAGNOSIS — D3142 Benign neoplasm of left ciliary body: Secondary | ICD-10-CM | POA: Diagnosis not present

## 2018-06-01 DIAGNOSIS — Z79899 Other long term (current) drug therapy: Secondary | ICD-10-CM | POA: Diagnosis not present

## 2018-06-01 DIAGNOSIS — H527 Unspecified disorder of refraction: Secondary | ICD-10-CM | POA: Diagnosis not present

## 2018-06-01 DIAGNOSIS — H26493 Other secondary cataract, bilateral: Secondary | ICD-10-CM | POA: Diagnosis not present

## 2018-06-01 DIAGNOSIS — Z961 Presence of intraocular lens: Secondary | ICD-10-CM | POA: Diagnosis not present

## 2018-06-01 DIAGNOSIS — M059 Rheumatoid arthritis with rheumatoid factor, unspecified: Secondary | ICD-10-CM | POA: Diagnosis not present

## 2018-06-07 ENCOUNTER — Ambulatory Visit (INDEPENDENT_AMBULATORY_CARE_PROVIDER_SITE_OTHER): Payer: Medicare HMO | Admitting: Sports Medicine

## 2018-06-07 DIAGNOSIS — S99912A Unspecified injury of left ankle, initial encounter: Secondary | ICD-10-CM | POA: Diagnosis not present

## 2018-06-07 MED ORDER — DIAZEPAM 5 MG PO TABS: ORAL_TABLET | ORAL | 0 refills | Status: DC

## 2018-06-07 MED ORDER — DICLOFENAC SODIUM 2 % TD SOLN: 2.0000 | Freq: Two times a day (BID) | TRANSDERMAL | 11 refills | Status: DC

## 2018-06-07 NOTE — Progress Notes (Signed)
Subjective:    CC: Left ankle  HPI: This is a very pleasant 67 year old female, over a month ago she inverted her left ankle, had immediate pain, swelling, bruising.  X-rays showed multiple and widespread degenerative changes but no fractures.  She really did not tolerate an ASO and returns with persistent pain and swelling.  Mostly localized over the lateral ankle, worse with weightbearing, no mechanical symptoms.  I reviewed the past medical history, family history, social history, surgical history, and allergies today and no changes were needed.  Please see the problem list section below in epic for further details.  Past Medical History: Past Medical History:  Diagnosis Date  . 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 AKI (acute kidney injury) ([place]) 02/2016  . Allergy   . Arthritis   . Carotid stenosis, asymptomatic, bilateral 05/26/2017   Dopplers 02/12/2016: right - mid-distal 40-59% stenosis; left - 40-59% stenosis  . Cataract   . CKD (chronic kidney disease) stage 3, GFR 30-59 ml/min (HCC) 11/02/2017  . Decreased calculated GFR 06/15/2017  . Gallstones 06/22/2017  . History of echocardiogram 11/07/2017   04/09/2016: EF 04%, mild diastolic dysfunction, trivial MR and TR  . Hypertension   . Hypothyroidism   . 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 Rheumatoid arthritis ([place])   . Rheumatoid arthritis involving multiple sites ([place]) 05/26/2017  . Transaminitis 06/15/2017   Normal abdominal US 06/22/17   Past Surgical History: Past Surgical History:  Procedure Laterality Date  . APPENDECTOMY    . CATARACT EXTRACTION, BILATERAL  2015  . 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  . TOTAL KNEE ARTHROPLASTY Right 12/16/2017   Procedure: RIGHT TOTAL KNEE ARTHROPLASTY;  Surgeon: Dorna Leitz, MD;  Location: [facility];  Service: Orthopedics;  Laterality: Right;  Adductor Block   Social History: Social History   Socioeconomic History  . Marital status: Widowed    Spouse name: Not on file    . Number of children: Not on file  . Years of education: Not on file  . Highest education level: Not on file  Occupational History  . Not on file  Social Needs  . Financial resource strain: Not on file  . Food insecurity:    Worry: Not on file    Inability: Not on file  . Transportation needs:    Medical: Not on file    Non-medical: Not on file  Tobacco Use  . Smoking status: Former Smoker    Types: Cigarettes    Last attempt to quit: 02/07/1974    Years since quitting: 44.3  . Smokeless tobacco: Never Used  Substance and Sexual Activity  . Alcohol use: Yes    Alcohol/week: 1.0 standard drinks    Types: 1 Glasses of wine per week  . Drug use: No  . Sexual activity: Yes  Lifestyle  . Physical activity:    Days per week: Not on file    Minutes per session: Not on file  . Stress: Not on file  Relationships  . Social connections:    Talks on phone: Not on file    Gets together: Not on file    Attends religious service: Not on file    Active member of club or organization: Not on file    Attends meetings of clubs or organizations: Not on file    Relationship status: Not on file  Other Topics Concern  . Not on file  Social History Narrative  . Not on file   Family History: Family History  Problem  Relation Age of Onset  . Diabetes Mother   . Lung cancer Father   . Hypertension Father    Allergies: Allergies  Allergen Reactions  . Bee Venom Anaphylaxis  . Oxycodone Other (See Comments)    Adrenal crisis while taking both prednisone and oxycodone   . Prednisone     Adrenal crisis while taking both prednisone and oxycodone   . Verapamil Hives  . Nickel Rash  . Nitrofuran Derivatives Rash   Medications: See med rec.  Review of Systems: No fevers, chills, night sweats, weight loss, chest pain, or shortness of breath.   Objective:    General: Well Developed, well nourished, and in no acute distress.  Neuro: Alert and oriented x3, extra-ocular muscles intact,  sensation grossly intact.  HEENT: Normocephalic, atraumatic, pupils equal round reactive to light, neck supple, no masses, no lymphadenopathy, thyroid nonpalpable.  Skin: Warm and dry, no rashes. Cardiac: Regular rate and rhythm, no murmurs rubs or gallops, no lower extremity edema.  Respiratory: Clear to auscultation bilaterally. Not using accessory muscles, speaking in full sentences. Left ankle: Visibly swollen, mild residual bruising Range of motion is full in all directions. Strength is 5/5 in all directions. Tender to palpation over the ATFL, positive anterior drawer sign with reproduction of pain. Talar dome nontender; No pain at base of 5th MT; No tenderness over cuboid; No tenderness over N spot or navicular prominence No tenderness on posterior aspects of lateral and medial malleolus No sign of peroneal tendon subluxations; Negative tarsal tunnel tinel's Able to walk 4 steps.  Impression and Recommendations:    Left ankle injury, initial encounter ATFL tear is likely, with superimposed osteoarthritis, widespread through the foot and ankle. I think we need to give this a little bit longer, switching to a cam boot, adding formal physical therapy, MRI and topical Pennsaid (diclofenac 2% topical). Valium for preprocedural anxiolysis ___________________________________________ Gwen Her. Dianah Field, M.D., ABFM., CAQSM. Primary Care and [place] Instructor of [employer] [facility]

## 2018-06-07 NOTE — Assessment & Plan Note (Signed)
ATFL tear is likely, with superimposed osteoarthritis, widespread through the foot and ankle. I think we need to give this a little bit longer, switching to a cam boot, adding formal physical therapy, MRI and topical Pennsaid (diclofenac 2% topical). Valium for preprocedural anxiolysis

## 2018-06-09 ENCOUNTER — Encounter: Payer: Self-pay | Admitting: Rehabilitative and Restorative Service Providers"

## 2018-06-09 ENCOUNTER — Ambulatory Visit: Payer: Medicare HMO | Admitting: Rehabilitative and Restorative Service Providers"

## 2018-06-09 DIAGNOSIS — R2689 Other abnormalities of gait and mobility: Secondary | ICD-10-CM

## 2018-06-09 DIAGNOSIS — M6281 Muscle weakness (generalized): Secondary | ICD-10-CM | POA: Diagnosis not present

## 2018-06-09 DIAGNOSIS — M25572 Pain in left ankle and joints of left foot: Secondary | ICD-10-CM | POA: Diagnosis not present

## 2018-06-09 NOTE — Patient Instructions (Addendum)
HIP: Hamstrings - Supine  Place strap around foot. Raise leg up, keeping knee straight. Let toes come up toward nose to stretch through ankle. Bend opposite knee to protect back if indicated. Hold 30 seconds. 3 reps per set, 2-3 sets per day   Gastroc, Sitting (Passive) can do this lying down with dog leash     Sit with strap or towel around ball of foot. Gently pull toward body. Hold __30_ seconds.  Repeat __3_ times per session. Do _2__ sessions per day.    Ankle Pump    With left leg elevated, gently flex and extend ankle. Move through full range of motion. Avoid pain. Repeat __10-20__ times per set. Do __2__ sessions per day.    Ankle Alphabet    Using left ankle and foot only, trace the letters of the alphabet. Perform A to Z. Repeat __1__ times per set.  Do __2__ sessions per day.    Ankle Circles    Slowly rotate right foot and ankle clockwise then counterclockwise. Gradually increase range of motion. Avoid pain. Circle __10-20__ times each direction per set. Do _2___ sessions per day.     Toe Curl: Bilateral    With both feet resting on towel, slowly bunch up towel by curling toes. Hold _30-45___ seconds. Repeat __3-5__ times per set.  Do _2___ sessions per day.

## 2018-06-09 NOTE — Therapy (Signed)
[facility] [place], [place], [address] Phone: [phone]   Fax:  [phone]  Physical Therapy Evaluation  Patient Details  Name: Melody Moore MRN: [id number] Date of Birth: 04-12-1951 Referring Provider: Dr Dianah Field    Encounter Date: 06/09/2018  PT End of Session - 06/09/18 0933    Visit Number  1    Number of Visits  12    Date for PT Re-Evaluation  07/21/18    PT Start Time  0931    PT Stop Time  1029    PT Time Calculation (min)  58 min    Activity Tolerance  Patient tolerated treatment well       Past Medical History:  Diagnosis Date  . Adrenal crisis ([place]) 02/12/2016  . AKI (acute kidney injury) ([place]) 02/2016  . Allergy   . Arthritis   . Carotid stenosis, asymptomatic, bilateral 05/26/2017   Dopplers 02/12/2016: right - mid-distal 40-59% stenosis; left - 40-59% stenosis  . Cataract   . CKD (chronic kidney disease) stage 3, GFR 30-59 ml/min (HCC) 11/02/2017  . Decreased calculated GFR 06/15/2017  . Gallstones 06/22/2017  . History of echocardiogram 11/07/2017   04/09/2016: EF 54%, mild diastolic dysfunction, trivial MR and TR  . Hypertension   . Hypothyroidism   . 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 Rheumatoid arthritis ([place])   . Rheumatoid arthritis involving multiple sites ([place]) 05/26/2017  . Transaminitis 06/15/2017   Normal abdominal US 06/22/17    Past Surgical History:  Procedure Laterality Date  . APPENDECTOMY    . CATARACT EXTRACTION, BILATERAL  2015  . 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  . TOTAL KNEE ARTHROPLASTY Right 12/16/2017   Procedure: RIGHT TOTAL KNEE ARTHROPLASTY;  Surgeon: Dorna Leitz, MD;  Location: [facility];  Service: Orthopedics;  Laterality: Right;  Adductor Block    There were no vitals filed for this visit.   Subjective Assessment - 06/09/18 0940    Subjective  Patient reports that she was walking on uneven surfaces 05/07/18 when she rolled her  ankle. She noticed swelling and aching in the ankle which has continued since time of injury. She was placed in walking boot ~ 2 days ago which has helped     Pertinent History  Rt TKA 3/19; Lt 5/17; HTN     Patient Stated Goals  get rid of the ankle pain and walk normally     Currently in Pain?  Yes    Pain Score  8     Pain Location  Ankle    Pain Orientation  Left    Pain Descriptors / Indicators  Throbbing    Pain Type  Acute pain    Pain Onset  More than a month ago    Pain Frequency  Intermittent    Aggravating Factors   moving ankle; standing; walking     Pain Relieving Factors  rest; ice; elevation; boot          [facility] PT Assessment - 06/09/18 0001      Assessment   Medical Diagnosis  Lt ankle sprain     Referring Provider  Dr Dianah Field     Onset Date/Surgical Date  05/07/18    Hand Dominance  Right    Next MD Visit  07/05/18    Prior Therapy  for TKA       Precautions   Precaution Comments  in walking boot       Restrictions   Weight Bearing Restrictions  No      Balance Screen   Has the patient fallen in the past 6 months  No    Has the patient had a decrease in activity level because of a fear of falling?   No    Is the patient reluctant to leave their home because of a fear of falling?   No      Prior Function   Level of Independence  Independent    Vocation  Retired    Theatre manager care admin     Leisure  walking almost daily with puppy; household chores       Observation/Other Assessments   Focus on Therapeutic Outcomes (FOTO)   66% limitation       Observation/Other Assessments-Edema    Edema  --   mild edema lateral Lt ankle      Sensation   Additional Comments  WFL's per pt report       Posture/Postural Control   Posture Comments  flexed forward at hips/trunk       AROM   Right/Left Hip  --   WNL's   Right/Left Knee  --   WNL's   Right Ankle Dorsiflexion  0    Right Ankle Plantar Flexion  32    Right Ankle Inversion   12    Right Ankle Eversion  10    Left Ankle Dorsiflexion  -2    Left Ankle Plantar Flexion  34    Left Ankle Inversion  18    Left Ankle Eversion  8      Strength   Right/Left Hip  --   5/5 bilat hip   Right/Left Knee  --   5/5 bilat LE's    Right Ankle Dorsiflexion  5/5    Right Ankle Plantar Flexion  5/5    Right Ankle Inversion  5/5    Right Ankle Eversion  5/5    Left Ankle Dorsiflexion  4+/5    Left Ankle Plantar Flexion  4/5    Left Ankle Inversion  4+/5    Left Ankle Eversion  3/5   painful      Flexibility   Hamstrings  tight Lt > Rt     Quadriceps  NT     ITB  WFL's    Piriformis  WFL's       Palpation   Palpation comment  tender to palpation through the ATFL and anterior/posterior lateral malleolus       Ambulation/Gait   Gait Comments  ambulates with walking boot                 Objective measurements completed on examination: See above findings.      [facility] Adult PT Treatment/Exercise - 06/09/18 0001      Vasopneumatic   Number Minutes Vasopneumatic   15 minutes    Vasopnuematic Location   Ankle   Lt   Vasopneumatic Pressure  Low    Vasopneumatic Temperature   34 deg       Ankle Exercises: Stretches   Gastroc Stretch  2 reps;30 seconds   supine with strap    Other Stretch  HS stretch with toes toward nose 30 sec x 2      Ankle Exercises: Seated   ABC's  1 rep    Ankle Circles/Pumps  AROM;Left;20 reps    Towel Crunch  2 reps   30-45 sec             PT Education -  06/09/18 1011    Education Details  HEP     Person(s) Educated  Patient    Methods  Explanation;Demonstration;Tactile cues;Verbal cues;Handout    Comprehension  Verbalized understanding;Returned demonstration;Verbal cues required;Tactile cues required       PT Short Term Goals - 06/09/18 1036      PT SHORT TERM GOAL #1   Title  -    Time  0    Period  Days    Status  Unable to assess      PT SHORT TERM GOAL #2   Title  -    Time  0    Period  Days     Status  Unable to assess      PT SHORT TERM GOAL #3   Title  -    Time  0    Period  Days    Status  Unable to assess        PT Long Term Goals - 06/09/18 1036      PT LONG TERM GOAL #1   Title  Decrease Lt ankle pain by 50-75% allowing patient to walk and perform normal functional activities 07/21/18    Time  6    Period  Weeks    Status  New      PT LONG TERM GOAL #2   Title  5-/5 to 5/5 strength Lt ankle 07/21/18    Time  6    Period  Weeks    Status  New      PT LONG TERM GOAL #3   Title  Independent in gait without assistive device and without walking boot for home/community distances 07/21/18    Time  6    Period  Weeks      PT LONG TERM GOAL #4   Title  Independent in HEP 07/21/18    Time  6    Period  Weeks    Status  New      PT LONG TERM GOAL #5   Title  Improve FOTO to </= 44% limitation 07/21/18    Time  6    Period  Weeks    Status  New      PT LONG TERM GOAL #6   Title  Increase AROM Lt ankle by 2-5 degrees in all planes 07/21/18    Time  6    Period  Weeks    Status  New             Plan - 06/09/18 1022    Clinical Impression Statement  [name] presents with Lt ankle pain following inversion injury 05/07/18. She was placed in a walking boot ~ 2 days ago with improvement in pain. She has limited ROM and strength Lt ankle; edema and tenderness to palpation through the Lt lateral ankle. Patient will benefit from PT to address problems identified.     Clinical Presentation  Stable    Clinical Decision Making  Low    Rehab Potential  Good    PT Frequency  2x / week    PT Duration  6 weeks    PT Treatment/Interventions  Patient/family education;ADLs/Self Care Home Management;Cryotherapy;Electrical Stimulation;Iontophoresis 4mg /ml Dexamethasone;Moist Heat;Ultrasound;Dry needling;Manual techniques;Neuromuscular re-education;Gait training;Stair training;Therapeutic activities;Therapeutic exercise;Balance training    PT Next Visit Plan  review HEP;  progress with ROM and strengthening as tolerated; trial of wt bearing activities in standing w/ wt shift; retrograde massage and/or taping for edema as indicated; ionto for persistent lateral ankle pain; modalities as indicated  Consulted and Agree with Plan of Care  Patient       Patient will benefit from skilled therapeutic intervention in order to improve the following deficits and impairments:  Pain, Abnormal gait, Decreased strength, Decreased mobility, Decreased range of motion, Decreased activity tolerance  Visit Diagnosis: Acute left ankle pain - Plan: PT plan of care cert/re-cert  Muscle weakness (generalized) - Plan: PT plan of care cert/re-cert  Other abnormalities of gait and mobility - Plan: PT plan of care cert/re-cert     Problem List Patient Active Problem List   Diagnosis Date Noted  . Encounter for long-term (current) use of medications 05/24/2018  . Left ankle injury, initial encounter 05/24/2018  . Impacted cerumen of left ear 05/24/2018  . Elevated blood pressure reading in office with diagnosis of hypertension 05/24/2018  . Normocytic anemia 02/22/2018  . Encounter for monitoring statin therapy 02/22/2018  . Dyslipidemia 02/22/2018  . Blister of right heel 01/04/2018  . Primary osteoarthritis of right knee 12/16/2017  . Benign hypertension with chronic kidney disease, stage III ([place]) 12/04/2017  . Primary osteoarthritis involving multiple joints 12/04/2017  . History of echocardiogram 11/07/2017  . CKD (chronic kidney disease) stage 3, GFR 30-59 ml/min (HCC) 11/02/2017  . 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 Gallstones 06/22/2017  . Elevated serum creatinine 06/15/2017  . Elevated alkaline phosphatase level 06/15/2017  . Transaminitis 06/15/2017  . Acquired hypothyroidism 05/26/2017  . Essential hypertension 05/26/2017  . H/O total knee replacement, left 05/26/2017  . History of adrenal insufficiency 05/26/2017  . Rheumatoid arthritis  involving multiple sites ([place]) 05/26/2017  . Osteoarthritis of right knee 05/26/2017  . Allergic rhinitis 05/26/2017  . Right carotid bruit 05/26/2017  . Carotid stenosis, asymptomatic, bilateral 05/26/2017  . Allergy to honey bee venom 05/26/2017    [place] Lebeau Nilda Simmer PT, MPH  06/09/2018, 10:41 AM  [facility] [place] [place] [place], [place], [address] Phone: [phone]   Fax:  [phone]  Name: Lynia Landry MRN: [id number] Date of Birth: [birth year]

## 2018-06-12 ENCOUNTER — Ambulatory Visit: Payer: Medicare HMO | Admitting: Rehabilitative and Restorative Service Providers"

## 2018-06-12 ENCOUNTER — Other Ambulatory Visit: Payer: Self-pay | Admitting: Physician Assistant

## 2018-06-12 ENCOUNTER — Encounter: Payer: Self-pay | Admitting: Rehabilitative and Restorative Service Providers"

## 2018-06-12 DIAGNOSIS — G8929 Other chronic pain: Secondary | ICD-10-CM

## 2018-06-12 DIAGNOSIS — R2689 Other abnormalities of gait and mobility: Secondary | ICD-10-CM | POA: Diagnosis not present

## 2018-06-12 DIAGNOSIS — M25572 Pain in left ankle and joints of left foot: Secondary | ICD-10-CM

## 2018-06-12 DIAGNOSIS — M25561 Pain in right knee: Secondary | ICD-10-CM

## 2018-06-12 DIAGNOSIS — M6281 Muscle weakness (generalized): Secondary | ICD-10-CM | POA: Diagnosis not present

## 2018-06-12 DIAGNOSIS — N183 Chronic kidney disease, stage 3 unspecified: Secondary | ICD-10-CM

## 2018-06-12 DIAGNOSIS — I129 Hypertensive chronic kidney disease with stage 1 through stage 4 chronic kidney disease, or unspecified chronic kidney disease: Secondary | ICD-10-CM

## 2018-06-12 NOTE — Patient Instructions (Addendum)
Balance: Unilateral hold to counter for safety     Attempt to balance on left leg, eyes open. Hold _30__ seconds. Repeat __3__ times per set. Do _ 2-3____ sessions per day.   IONTOPHORESIS PATIENT PRECAUTIONS & CONTRAINDICATIONS:  . Redness under one or both electrodes can occur.  This characterized by a uniform redness that usually disappears within 12 hours of treatment. . Small pinhead size blisters may result in response to the drug.  Contact your physician if the problem persists more than 24 hours. . On rare occasions, iontophoresis therapy can result in temporary skin reactions such as rash, inflammation, irritation or burns.  The skin reactions may be the result of individual sensitivity to the ionic solution used, the condition of the skin at the start of treatment, reaction to the materials in the electrodes, allergies or sensitivity to dexamethasone, or a poor connection between the patch and your skin.  Discontinue using iontophoresis if you have any of these reactions and report to your therapist. . Remove the Patch or electrodes if you have any undue sensation of pain or burning during the treatment and report discomfort to your therapist. . Tell your Therapist if you have had known adverse reactions to the application of electrical current. . If using the Patch, the LED light will turn off when treatment is complete and the patch can be removed.  Approximate treatment time is 1-3 hours.  Remove the patch when light goes off or after 6 hours. . The Patch can be worn during normal activity, however excessive motion where the electrodes have been placed can cause poor contact between the skin and the electrode or uneven electrical current resulting in greater risk of skin irritation. Marland Kitchen Keep out of the reach of children.   . DO NOT use if you have a cardiac pacemaker or any other electrically sensitive implanted device. . DO NOT use if you have a known sensitivity to  dexamethasone. . DO NOT use during Magnetic Resonance Imaging (MRI). . DO NOT use over broken or compromised skin (e.g. sunburn, cuts, or acne) due to the increased risk of skin reaction. . DO NOT SHAVE over the area to be treated:  To establish good contact between the Patch and the skin, excessive hair may be clipped. . DO NOT place the Patch or electrodes on or over your eyes, directly over your heart, or brain. . DO NOT reuse the Patch or electrodes as this may cause burns to occur.

## 2018-06-12 NOTE — Therapy (Signed)
[place], [place], [address] Phone: [phone]   Fax:  [phone]  Physical Therapy Treatment  Patient Details  Name: Melody Moore MRN: [id number] Date of Birth: 1951/05/11 Referring Provider: Dr Dianah Field   Encounter Date: 06/12/2018  PT End of Session - 06/12/18 0721    Visit Number  2    Number of Visits  12    Date for PT Re-Evaluation  07/21/18    PT Start Time  0718    PT Stop Time  0811    PT Time Calculation (min)  53 min    Activity Tolerance  Patient tolerated treatment well       Past Medical History:  Diagnosis Date  . Adrenal crisis ([place]) 02/12/2016  . AKI (acute kidney injury) ([place]) 02/2016  . Allergy   . Arthritis   . Carotid stenosis, asymptomatic, bilateral 05/26/2017   Dopplers 02/12/2016: right - mid-distal 40-59% stenosis; left - 40-59% stenosis  . Cataract   . CKD (chronic kidney disease) stage 3, GFR 30-59 ml/min (HCC) 11/02/2017  . Decreased calculated GFR 06/15/2017  . Gallstones 06/22/2017  . History of echocardiogram 11/07/2017   04/09/2016: EF 62%, mild diastolic dysfunction, trivial MR and TR  . Hypertension   . Hypothyroidism   . 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 Rheumatoid arthritis ([place])   . Rheumatoid arthritis involving multiple sites ([place]) 05/26/2017  . Transaminitis 06/15/2017   Normal abdominal US 06/22/17    Past Surgical History:  Procedure Laterality Date  . APPENDECTOMY    . CATARACT EXTRACTION, BILATERAL  2015  . 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  . TOTAL KNEE ARTHROPLASTY Right 12/16/2017   Procedure: RIGHT TOTAL KNEE ARTHROPLASTY;  Surgeon: Dorna Leitz, MD;  Location: [facility];  Service: Orthopedics;  Laterality: Right;  Adductor Block    There were no vitals filed for this visit.  Subjective Assessment - 06/12/18 0722    Subjective  Patient reports that she is having less pain in general. Mornings are less painful. She  is still in the walking boot most of the time.     Currently in Pain?  No/denies         [facility] PT Assessment - 06/12/18 0001      Assessment   Medical Diagnosis  Lt ankle sprain     Referring Provider  Dr Dianah Field    Onset Date/Surgical Date  05/07/18    Hand Dominance  Right    Next MD Visit  07/05/18    Prior Therapy  for TKA       AROM   Left Ankle Dorsiflexion  5      Palpation   Palpation comment  tender to palpation through the ATFL and anterior/posterior lateral malleolus                    [facility] Adult PT Treatment/Exercise - 06/12/18 0001      Iontophoresis   Type of Iontophoresis  Dexamethasone    Location  lateral Lt ankle    Dose  80 mAmp    Time  8 hours       Vasopneumatic   Number Minutes Vasopneumatic   15 minutes    Vasopnuematic Location   Ankle   Lt   Vasopneumatic Pressure  Low    Vasopneumatic Temperature   34 deg       Manual Therapy   Manual therapy comments  pt supine     Joint  Mobilization  calcaeous/talus    Soft tissue mobilization  gentle soft tissue work lateral ankle; retrograde massage Lt foot and ankle    Passive ROM  PROM/gentle stretch Lt ankle       Ankle Exercises: Aerobic   Nustep  L5 x 6 min (UE 9)      Ankle Exercises: Stretches   Soleus Stretch  3 reps;30 seconds   standing    Gastroc Stretch  30 seconds;2 reps   standing - discomfort      Ankle Exercises: Standing   SLS  30 sec x 3 reps each LE hand hold for balance     Other Standing Ankle Exercises  alternate toe touch to 12 inch step - UE support at railing       Ankle Exercises: Seated   ABC's  1 rep    Ankle Circles/Pumps  AROM;Left;20 reps      Ankle Exercises: Supine   T-Band  DF with red TB x 10; inv/ever without resistance    discomfort             PT Education - 06/12/18 0757    Education Details  HEP ionto     Person(s) Educated  Patient    Methods  Explanation;Demonstration;Tactile cues;Verbal cues;Handout    Comprehension   Verbalized understanding;Returned demonstration;Verbal cues required;Tactile cues required       PT Short Term Goals - 06/09/18 1036      PT SHORT TERM GOAL #1   Title  -    Time  0    Period  Days    Status  Unable to assess      PT SHORT TERM GOAL #2   Title  -    Time  0    Period  Days    Status  Unable to assess      PT SHORT TERM GOAL #3   Title  -    Time  0    Period  Days    Status  Unable to assess        PT Long Term Goals - 06/12/18 0723      PT LONG TERM GOAL #1   Title  Decrease Lt ankle pain by 50-75% allowing patient to walk and perform normal functional activities 07/21/18    Time  6    Period  Weeks    Status  On-going      PT LONG TERM GOAL #2   Title  5-/5 to 5/5 strength Lt ankle 07/21/18    Time  6    Period  Weeks    Status  On-going      PT LONG TERM GOAL #3   Title  Independent in gait without assistive device and without walking boot for home/community distances 07/21/18    Time  6    Period  Weeks    Status  On-going      PT LONG TERM GOAL #4   Title  Independent in HEP 07/21/18    Time  6    Period  Weeks    Status  On-going      PT LONG TERM GOAL #5   Title  Improve FOTO to </= 44% limitation 07/21/18    Time  6    Period  Weeks    Status  On-going      PT LONG TERM GOAL #6   Title  Increase AROM Lt ankle by 2-5 degrees in all planes 07/21/18    Time  6    Period  Weeks    Status  On-going            Plan - 06/12/18 0724    Clinical Impression Statement  Patient reports that her foot and ankle are improving. Added wt bearing exercise. Increase in ROM noted. Continued tenderness to palpatioin lateral ankle. Progressing well with rehab.     PT Frequency  2x / week    PT Duration  6 weeks    PT Treatment/Interventions  Patient/family education;ADLs/Self Care Home Management;Cryotherapy;Electrical Stimulation;Iontophoresis 4mg /ml Dexamethasone;Moist Heat;Ultrasound;Dry needling;Manual techniques;Neuromuscular  re-education;Gait training;Stair training;Therapeutic activities;Therapeutic exercise;Balance training    PT Next Visit Plan  review HEP; progress with ROM and strengthening as tolerated; retrograde massage and/or taping for edema as indicated; ionto for persistent lateral ankle pain; modalities as indicated     Consulted and Agree with Plan of Care  Patient       Patient will benefit from skilled therapeutic intervention in order to improve the following deficits and impairments:  Pain, Abnormal gait, Decreased strength, Decreased mobility, Decreased range of motion, Decreased activity tolerance  Visit Diagnosis: Acute left ankle pain  Muscle weakness (generalized)  Other abnormalities of gait and mobility  Chronic pain of right knee     Problem List Patient Active Problem List   Diagnosis Date Noted  . Encounter for long-term (current) use of medications 05/24/2018  . Left ankle injury, initial encounter 05/24/2018  . Impacted cerumen of left ear 05/24/2018  . Elevated blood pressure reading in office with diagnosis of hypertension 05/24/2018  . Normocytic anemia 02/22/2018  . Encounter for monitoring statin therapy 02/22/2018  . Dyslipidemia 02/22/2018  . Blister of right heel 01/04/2018  . Primary osteoarthritis of right knee 12/16/2017  . Benign hypertension with chronic kidney disease, stage III ([place]) 12/04/2017  . Primary osteoarthritis involving multiple joints 12/04/2017  . History of echocardiogram 11/07/2017  . CKD (chronic kidney disease) stage 3, GFR 30-59 ml/min (HCC) 11/02/2017  . 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 Gallstones 06/22/2017  . Elevated serum creatinine 06/15/2017  . Elevated alkaline phosphatase level 06/15/2017  . Transaminitis 06/15/2017  . Acquired hypothyroidism 05/26/2017  . Essential hypertension 05/26/2017  . H/O total knee replacement, left 05/26/2017  . History of adrenal insufficiency 05/26/2017  . Rheumatoid  arthritis involving multiple sites ([place]) 05/26/2017  . Osteoarthritis of right knee 05/26/2017  . Allergic rhinitis 05/26/2017  . Right carotid bruit 05/26/2017  . Carotid stenosis, asymptomatic, bilateral 05/26/2017  . Allergy to honey bee venom 05/26/2017    Melody Moore Nilda Simmer PT, MPH  06/12/2018, 8:03 AM  [facility] [place], [place], [address] Phone: [phone]   Fax:  [phone]  Name: Melody Moore MRN: [id number] Date of Birth: 11/22/1950

## 2018-06-14 ENCOUNTER — Ambulatory Visit (INDEPENDENT_AMBULATORY_CARE_PROVIDER_SITE_OTHER): Payer: Medicare HMO

## 2018-06-14 DIAGNOSIS — Z1231 Encounter for screening mammogram for malignant neoplasm of breast: Secondary | ICD-10-CM | POA: Diagnosis not present

## 2018-06-15 ENCOUNTER — Encounter: Payer: Self-pay | Admitting: Rehabilitative and Restorative Service Providers"

## 2018-06-15 ENCOUNTER — Ambulatory Visit (INDEPENDENT_AMBULATORY_CARE_PROVIDER_SITE_OTHER): Payer: Medicare HMO | Admitting: Rehabilitative and Restorative Service Providers"

## 2018-06-15 DIAGNOSIS — R2689 Other abnormalities of gait and mobility: Secondary | ICD-10-CM

## 2018-06-15 DIAGNOSIS — M25561 Pain in right knee: Secondary | ICD-10-CM

## 2018-06-15 DIAGNOSIS — M6281 Muscle weakness (generalized): Secondary | ICD-10-CM

## 2018-06-15 DIAGNOSIS — G8929 Other chronic pain: Secondary | ICD-10-CM | POA: Diagnosis not present

## 2018-06-15 DIAGNOSIS — M25572 Pain in left ankle and joints of left foot: Secondary | ICD-10-CM

## 2018-06-15 NOTE — Therapy (Signed)
[place], [place], [address] Phone: [phone]   Fax:  [phone]  Physical Therapy Treatment  Patient Details  Name: Melody Moore MRN: [id number] Date of Birth: [birth year] Referring Provider: Dr Dianah Field   Encounter Date: 06/15/2018  PT End of Session - 06/15/18 1151    Visit Number  3    Number of Visits  12    Date for PT Re-Evaluation  07/21/18    PT Start Time  8938    PT Stop Time  1242    PT Time Calculation (min)  54 min    Activity Tolerance  Patient tolerated treatment well       Past Medical History:  Diagnosis Date  . Adrenal crisis ([place]) 02/12/2016  . AKI (acute kidney injury) ([place]) 02/2016  . Allergy   . Arthritis   . Carotid stenosis, asymptomatic, bilateral 05/26/2017   Dopplers 02/12/2016: right - mid-distal 40-59% stenosis; left - 40-59% stenosis  . Cataract   . CKD (chronic kidney disease) stage 3, GFR 30-59 ml/min (HCC) 11/02/2017  . Decreased calculated GFR 06/15/2017  . Gallstones 06/22/2017  . History of echocardiogram 11/07/2017   04/09/2016: EF 10%, mild diastolic dysfunction, trivial MR and TR  . Hypertension   . Hypothyroidism   . 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 Rheumatoid arthritis ([place])   . Rheumatoid arthritis involving multiple sites ([place]) 05/26/2017  . Transaminitis 06/15/2017   Normal abdominal US 06/22/17    Past Surgical History:  Procedure Laterality Date  . APPENDECTOMY    . CATARACT EXTRACTION, BILATERAL  2015  . 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  . TOTAL KNEE ARTHROPLASTY Right 12/16/2017   Procedure: RIGHT TOTAL KNEE ARTHROPLASTY;  Surgeon: Dorna Leitz, MD;  Location: [facility];  Service: Orthopedics;  Laterality: Right;  Adductor Block    There were no vitals filed for this visit.  Subjective Assessment - 06/15/18 1152    Subjective  Patient reports that the patch "worked good" She is using the boot for walking at home.  Feels she is making progress.     Currently in Pain?  No/denies                       [facility] Adult PT Treatment/Exercise - 06/15/18 0001      Iontophoresis   Type of Iontophoresis  Dexamethasone    Location  lateral Lt ankle    Dose  80 mAmp    Time  8 hours       Vasopneumatic   Number Minutes Vasopneumatic   15 minutes    Vasopnuematic Location   Ankle   Lt   Vasopneumatic Pressure  Low    Vasopneumatic Temperature   34 deg       Manual Therapy   Manual therapy comments  pt supine     Joint Mobilization  calcaeous/talus    Soft tissue mobilization  gentle soft tissue work lateral ankle; retrograde massage Lt foot and ankle    Passive ROM  PROM/gentle stretch Lt ankle       Ankle Exercises: Aerobic   Nustep  L5 x 6 min (UE 10)      Ankle Exercises: Stretches   Soleus Stretch  3 reps;30 seconds   standing    Gastroc Stretch  30 seconds;2 reps   standing - discomfort      Ankle Exercises: Standing   SLS  30 sec x 3 reps each LE hand  hold for balance     Other Standing Ankle Exercises  alternate toe touch to 12 inch step - UE support at railing; wall slide 5 sec hold x 10 reps     Other Standing Ankle Exercises  heel to toe walking; backwards walking; side steps to each side ~ 12 feet x 6       Ankle Exercises: Seated   Ankle Circles/Pumps  AROM;Left;20 reps    BAPS  Sitting;Level 2;10 reps   PF/DF; inv/ever; circles CW/CCW - discomfort with circles            PT Education - 06/15/18 1239    Education Details  HEP - heel to toe; backwards walking; side steps     Person(s) Educated  Patient    Methods  Explanation;Demonstration;Tactile cues;Verbal cues    Comprehension  Verbalized understanding;Returned demonstration;Verbal cues required;Tactile cues required       PT Short Term Goals - 06/09/18 1036      PT SHORT TERM GOAL #1   Title  -    Time  0    Period  Days    Status  Unable to assess      PT SHORT TERM GOAL #2   Title  -     Time  0    Period  Days    Status  Unable to assess      PT SHORT TERM GOAL #3   Title  -    Time  0    Period  Days    Status  Unable to assess        PT Long Term Goals - 06/12/18 0723      PT LONG TERM GOAL #1   Title  Decrease Lt ankle pain by 50-75% allowing patient to walk and perform normal functional activities 07/21/18    Time  6    Period  Weeks    Status  On-going      PT LONG TERM GOAL #2   Title  5-/5 to 5/5 strength Lt ankle 07/21/18    Time  6    Period  Weeks    Status  On-going      PT LONG TERM GOAL #3   Title  Independent in gait without assistive device and without walking boot for home/community distances 07/21/18    Time  6    Period  Weeks    Status  On-going      PT LONG TERM GOAL #4   Title  Independent in HEP 07/21/18    Time  6    Period  Weeks    Status  On-going      PT LONG TERM GOAL #5   Title  Improve FOTO to </= 44% limitation 07/21/18    Time  6    Period  Weeks    Status  On-going      PT LONG TERM GOAL #6   Title  Increase AROM Lt ankle by 2-5 degrees in all planes 07/21/18    Time  6    Period  Weeks    Status  On-going            Plan - 06/15/18 1153    Clinical Impression Statement  Patient continues to improve with decreased pain and swelling. Good response to ionto. Progressing with exercise and activities.     Rehab Potential  Good    PT Frequency  2x / week    PT Duration  6 weeks  PT Treatment/Interventions  Patient/family education;ADLs/Self Care Home Management;Cryotherapy;Electrical Stimulation;Iontophoresis 4mg /ml Dexamethasone;Moist Heat;Ultrasound;Dry needling;Manual techniques;Neuromuscular re-education;Gait training;Stair training;Therapeutic activities;Therapeutic exercise;Balance training    PT Next Visit Plan  review HEP; progress with ROM and strengthening as tolerated; retrograde massage and/or taping for edema as indicated; ionto for persistent lateral ankle pain; modalities as indicated      Consulted and Agree with Plan of Care  Patient       Patient will benefit from skilled therapeutic intervention in order to improve the following deficits and impairments:  Pain, Abnormal gait, Decreased strength, Decreased mobility, Decreased range of motion, Decreased activity tolerance  Visit Diagnosis: Acute left ankle pain  Muscle weakness (generalized)  Other abnormalities of gait and mobility  Chronic pain of right knee     Problem List Patient Active Problem List   Diagnosis Date Noted  . Encounter for long-term (current) use of medications 05/24/2018  . Left ankle injury, initial encounter 05/24/2018  . Impacted cerumen of left ear 05/24/2018  . Elevated blood pressure reading in office with diagnosis of hypertension 05/24/2018  . Normocytic anemia 02/22/2018  . Encounter for monitoring statin therapy 02/22/2018  . Dyslipidemia 02/22/2018  . Blister of right heel 01/04/2018  . Primary osteoarthritis of right knee 12/16/2017  . Benign hypertension with chronic kidney disease, stage III ([place]) 12/04/2017  . Primary osteoarthritis involving multiple joints 12/04/2017  . History of echocardiogram 11/07/2017  . CKD (chronic kidney disease) stage 3, GFR 30-59 ml/min (HCC) 11/02/2017  . 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 Gallstones 06/22/2017  . Elevated serum creatinine 06/15/2017  . Elevated alkaline phosphatase level 06/15/2017  . Transaminitis 06/15/2017  . Acquired hypothyroidism 05/26/2017  . Essential hypertension 05/26/2017  . H/O total knee replacement, left 05/26/2017  . History of adrenal insufficiency 05/26/2017  . Rheumatoid arthritis involving multiple sites ([place]) 05/26/2017  . Osteoarthritis of right knee 05/26/2017  . Allergic rhinitis 05/26/2017  . Right carotid bruit 05/26/2017  . Carotid stenosis, asymptomatic, bilateral 05/26/2017  . Allergy to honey bee venom 05/26/2017    Finlee Concepcion Nilda Simmer PT, MPH  06/15/2018, 12:41 PM  [facility] [place], [place], [address] Phone: [phone]   Fax:  [phone]  Name: Callyn Severtson MRN: [id number] Date of Birth: 1951-04-20

## 2018-06-16 ENCOUNTER — Encounter: Payer: Medicare HMO | Admitting: Physical Therapy

## 2018-06-16 DIAGNOSIS — M069 Rheumatoid arthritis, unspecified: Secondary | ICD-10-CM | POA: Diagnosis not present

## 2018-06-19 ENCOUNTER — Ambulatory Visit (INDEPENDENT_AMBULATORY_CARE_PROVIDER_SITE_OTHER): Payer: Medicare HMO | Admitting: Physical Therapy

## 2018-06-19 ENCOUNTER — Ambulatory Visit (INDEPENDENT_AMBULATORY_CARE_PROVIDER_SITE_OTHER): Payer: Medicare HMO

## 2018-06-19 DIAGNOSIS — M6281 Muscle weakness (generalized): Secondary | ICD-10-CM | POA: Diagnosis not present

## 2018-06-19 DIAGNOSIS — M19072 Primary osteoarthritis, left ankle and foot: Secondary | ICD-10-CM | POA: Diagnosis not present

## 2018-06-19 DIAGNOSIS — R2689 Other abnormalities of gait and mobility: Secondary | ICD-10-CM | POA: Diagnosis not present

## 2018-06-19 DIAGNOSIS — M25572 Pain in left ankle and joints of left foot: Secondary | ICD-10-CM

## 2018-06-19 NOTE — Therapy (Signed)
[place], [place], [address] Phone: [phone]   Fax:  [phone]  Physical Therapy Treatment  Patient Details  Name: Melody Moore MRN: [id number] Date of Birth: 01-Dec-1950 Referring Provider: Dr. Dianah Field   Encounter Date: 06/19/2018  PT End of Session - 06/19/18 0932    Visit Number  4    PT Start Time  0840    PT Stop Time  0940    PT Time Calculation (min)  60 min    Activity Tolerance  Patient tolerated treatment well    Behavior During Therapy  [facility] for tasks assessed/performed       Past Medical History:  Diagnosis Date  . Adrenal crisis ([place]) 02/12/2016  . AKI (acute kidney injury) ([place]) 02/2016  . Allergy   . Arthritis   . Carotid stenosis, asymptomatic, bilateral 05/26/2017   Dopplers 02/12/2016: right - mid-distal 40-59% stenosis; left - 40-59% stenosis  . Cataract   . CKD (chronic kidney disease) stage 3, GFR 30-59 ml/min (HCC) 11/02/2017  . Decreased calculated GFR 06/15/2017  . Gallstones 06/22/2017  . History of echocardiogram 11/07/2017   04/09/2016: EF 76%, mild diastolic dysfunction, trivial MR and TR  . Hypertension   . Hypothyroidism   . 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 Rheumatoid arthritis ([place])   . Rheumatoid arthritis involving multiple sites ([place]) 05/26/2017  . Transaminitis 06/15/2017   Normal abdominal US 06/22/17    Past Surgical History:  Procedure Laterality Date  . APPENDECTOMY    . CATARACT EXTRACTION, BILATERAL  2015  . 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  . TOTAL KNEE ARTHROPLASTY Right 12/16/2017   Procedure: RIGHT TOTAL KNEE ARTHROPLASTY;  Surgeon: Dorna Leitz, MD;  Location: [facility];  Service: Orthopedics;  Laterality: Right;  Adductor Block    There were no vitals filed for this visit.  Subjective Assessment - 06/19/18 0844    Subjective  Pt felt some pain in Lt ankle with carrying stuff up stairs.  She had boot on at the  time, "but maybe I didn't have it tight enough".  she is still wearing boot outside of therapy and has MRI today.   She still has pain with alphabet exercise.          [facility] PT Assessment - 06/19/18 0001      Assessment   Medical Diagnosis  Lt ankle sprain     Referring Provider  Dr. Dianah Field    Onset Date/Surgical Date  05/07/18    Hand Dominance  Right    Next MD Visit  07/05/18    Prior Therapy  for TKA       AROM   Left Ankle Dorsiflexion  2    Left Ankle Plantar Flexion  47    Left Ankle Inversion  28    Left Ankle Eversion  8       [facility] Adult PT Treatment/Exercise - 06/19/18 0001      Vasopneumatic   Number Minutes Vasopneumatic   15 minutes    Vasopnuematic Location   Ankle   Lt   Vasopneumatic Pressure  Medium    Vasopneumatic Temperature   34 deg       Manual Therapy   Manual therapy comments  pt supine     Soft tissue mobilization  gentle soft tissue work to dorsum of foot and lateral Lt ankle; retrograde massage Lt foot and ankle.    Passive ROM  PROM/gentle stretch Lt ankle  Ankle Exercises: Aerobic   Nustep  L5 x 6 min (UE 10)   PTA present to discuss progress     Ankle Exercises: Stretches   Soleus Stretch  2 reps;30 seconds   each leg   Gastroc Stretch  3 reps;30 seconds   each leg, less discomfort than last week     Ankle Exercises: Seated   Ankle Circles/Pumps  AROM;Left;10 reps   cues for just foot to move instead of whole leg.    BAPS  Sitting;Level 2;10 reps   PF/DF; inv/ever; circles CW/CCW - difficulty with circles   Other Seated Ankle Exercises  AROM bilat ankle eversion x 10, 2 sets       Ankle Exercises: Standing   SLS  Lt SLS up to 5 seconds without UE support, up to 13 seconds with RLE    Other Standing Ankle Exercises  forward step ups on 6" step with LLE x 10; step downs wiht RLE on 3" step x 10 reps (both sets with BUE support)       Ankle Exercises: Supine   T-Band  Lt ankle DF with red band x 8, limited motion,  challenging.           PT Long Term Goals - 06/12/18 0723      PT LONG TERM GOAL #1   Title  Decrease Lt ankle pain by 50-75% allowing patient to walk and perform normal functional activities 07/21/18    Time  6    Period  Weeks    Status  On-going      PT LONG TERM GOAL #2   Title  5-/5 to 5/5 strength Lt ankle 07/21/18    Time  6    Period  Weeks    Status  On-going      PT LONG TERM GOAL #3   Title  Independent in gait without assistive device and without walking boot for home/community distances 07/21/18    Time  6    Period  Weeks    Status  On-going      PT LONG TERM GOAL #4   Title  Independent in HEP 07/21/18    Time  6    Period  Weeks    Status  On-going      PT LONG TERM GOAL #5   Title  Improve FOTO to </= 44% limitation 07/21/18    Time  6    Period  Weeks    Status  On-going      PT LONG TERM GOAL #6   Title  Increase AROM Lt ankle by 2-5 degrees in all planes 07/21/18    Time  6    Period  Weeks    Status  On-going            Plan - 06/19/18 0936    Clinical Impression Statement  Pt's Lt ankle ROM is gradually improving.  She continues to have difficulty and discomfort with AROM in all directions, especially eversion.  Pt able to complete Lt SLS without support for 5 seconds today.  Progressing towards goals.     Rehab Potential  Good    PT Frequency  2x / week    PT Duration  6 weeks    PT Treatment/Interventions  Patient/family education;ADLs/Self Care Home Management;Cryotherapy;Electrical Stimulation;Iontophoresis 4mg /ml Dexamethasone;Moist Heat;Ultrasound;Dry needling;Manual techniques;Neuromuscular re-education;Gait training;Stair training;Therapeutic activities;Therapeutic exercise;Balance training    PT Next Visit Plan  continue gentle progression of exercise for Lt ankle for strengthening and ROM.  Consulted and Agree with Plan of Care  Patient       Patient will benefit from skilled therapeutic intervention in order to  improve the following deficits and impairments:  Pain, Abnormal gait, Decreased strength, Decreased mobility, Decreased range of motion, Decreased activity tolerance  Visit Diagnosis: Acute left ankle pain  Muscle weakness (generalized)  Other abnormalities of gait and mobility     Problem List Patient Active Problem List   Diagnosis Date Noted  . Encounter for long-term (current) use of medications 05/24/2018  . Left ankle injury, initial encounter 05/24/2018  . Impacted cerumen of left ear 05/24/2018  . Elevated blood pressure reading in office with diagnosis of hypertension 05/24/2018  . Normocytic anemia 02/22/2018  . Encounter for monitoring statin therapy 02/22/2018  . Dyslipidemia 02/22/2018  . Blister of right heel 01/04/2018  . Primary osteoarthritis of right knee 12/16/2017  . Benign hypertension with chronic kidney disease, stage III ([place]) 12/04/2017  . Primary osteoarthritis involving multiple joints 12/04/2017  . History of echocardiogram 11/07/2017  . CKD (chronic kidney disease) stage 3, GFR 30-59 ml/min (HCC) 11/02/2017  . 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 Gallstones 06/22/2017  . Elevated serum creatinine 06/15/2017  . Elevated alkaline phosphatase level 06/15/2017  . Transaminitis 06/15/2017  . Acquired hypothyroidism 05/26/2017  . Essential hypertension 05/26/2017  . H/O total knee replacement, left 05/26/2017  . History of adrenal insufficiency 05/26/2017  . Rheumatoid arthritis involving multiple sites ([place]) 05/26/2017  . Osteoarthritis of right knee 05/26/2017  . Allergic rhinitis 05/26/2017  . Right carotid bruit 05/26/2017  . Carotid stenosis, asymptomatic, bilateral 05/26/2017  . Allergy to honey bee venom 05/26/2017   Kerin Perna, PTA 06/19/18 9:46 AM  [facility] [place], [place], [address] Phone: [phone]   Fax:  [phone]  Name:  Melody Moore MRN: [id number] Date of Birth: August 20, 1951

## 2018-06-22 ENCOUNTER — Other Ambulatory Visit: Payer: Self-pay | Admitting: Physician Assistant

## 2018-06-23 ENCOUNTER — Ambulatory Visit: Payer: Medicare HMO | Admitting: Physical Therapy

## 2018-06-23 DIAGNOSIS — M6281 Muscle weakness (generalized): Secondary | ICD-10-CM | POA: Diagnosis not present

## 2018-06-23 DIAGNOSIS — R2689 Other abnormalities of gait and mobility: Secondary | ICD-10-CM | POA: Diagnosis not present

## 2018-06-23 DIAGNOSIS — M25572 Pain in left ankle and joints of left foot: Secondary | ICD-10-CM | POA: Diagnosis not present

## 2018-06-23 NOTE — Therapy (Signed)
[place] [address] [address], [place], [address] Phone: [phone]   Fax:  [phone]  Physical Therapy Treatment  Patient Details  Name: Melody Moore MRN: [id number] Date of Birth: [birth year] Referring Provider: Dr. Dianah Field   Encounter Date: 06/23/2018  PT End of Session - 06/23/18 0948    Visit Number  5    Number of Visits  12    Date for PT Re-Evaluation  07/21/18    PT Start Time  0850    PT Stop Time  0928    PT Time Calculation (min)  38 min    Activity Tolerance  Patient tolerated treatment well;No increased pain    Behavior During Therapy  WFL for tasks assessed/performed       Past Medical History:  Diagnosis Date  . Adrenal crisis ([place]) 02/12/2016  . AKI (acute kidney injury) ([place]) 02/2016  . Allergy   . Arthritis   . Carotid stenosis, asymptomatic, bilateral 05/26/2017   Dopplers 02/12/2016: right - mid-distal 40-59% stenosis; left - 40-59% stenosis  . Cataract   . CKD (chronic kidney disease) stage 3, GFR 30-59 ml/min (HCC) 11/02/2017  . Decreased calculated GFR 06/15/2017  . Gallstones 06/22/2017  . History of echocardiogram 11/07/2017   04/09/2016: EF 15%, mild diastolic dysfunction, trivial MR and TR  . Hypertension   . Hypothyroidism   . 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 Rheumatoid arthritis ([place])   . Rheumatoid arthritis involving multiple sites ([place]) 05/26/2017  . Transaminitis 06/15/2017   Normal abdominal US 06/22/17    Past Surgical History:  Procedure Laterality Date  . APPENDECTOMY    . CATARACT EXTRACTION, BILATERAL  2015  . 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  . TOTAL KNEE ARTHROPLASTY Right 12/16/2017   Procedure: RIGHT TOTAL KNEE ARTHROPLASTY;  Surgeon: Dorna Leitz, MD;  Location: [facility];  Service: Orthopedics;  Laterality: Right;  Adductor Block    There were no vitals filed for this visit.  Subjective Assessment - 06/23/18 0948    Subjective  "I had  my MRI, and I just have some arthritis.. nothing is broken".   Pt reports she is doing well. "I didn't even have to ice at night".   Pt reports she is pleasantly surprised how well she can go up and down stairs now.     Patient Stated Goals  get rid of the ankle pain and walk normally     Currently in Pain?  No/denies    Pain Score  0-No pain         [facility] PT Assessment - 06/23/18 0001      Assessment   Medical Diagnosis  Lt ankle sprain     Referring Provider  Dr. Dianah Field    Onset Date/Surgical Date  05/07/18    Hand Dominance  Right    Next MD Visit  07/05/18      AROM   Left Ankle Dorsiflexion  7    Left Ankle Plantar Flexion  51    Left Ankle Inversion  28    Left Ankle Eversion  12      Strength   Left Ankle Dorsiflexion  5/5    Left Ankle Inversion  --   5-/5   Left Ankle Eversion  4/5       [facility] Adult PT Treatment/Exercise - 06/23/18 0001      Iontophoresis   Type of Iontophoresis  Dexamethasone    Location  Lt dorsum of foot, just distal to  ATFL.     Dose  1.0 cc     Time  80 mA stat patch, 6hr patch      Vasopneumatic   Number Minutes Vasopneumatic   --   pt declined      Manual Therapy   Soft tissue mobilization  gentle soft tissue work to dorsum of foot and lateral Lt ankle    Passive ROM  PROM/gentle stretch Lt ankle and forefoot      Ankle Exercises: Aerobic   Nustep  L5 x 5 min (UE 10)   PTA present to discuss progress     Ankle Exercises: Stretches   Soleus Stretch  2 reps;30 seconds   each leg   Gastroc Stretch  3 reps;30 seconds   each leg, less discomfort than last week     Ankle Exercises: Seated   Ankle Circles/Pumps  AROM;Left;10 reps   cues for just foot to move instead of whole leg.    Other Seated Ankle Exercises  Lt foot inversion/eversion and PF/DF with foot on 1/2 foam roller 20 reps each direction; Lt foot eversion with red band x 10, 2 sets (tactile cues for form)      Ankle Exercises: Standing   SLS  SLS with single  finger support on counter x 30 sec each leg; SLS on LLE without support up to 22 sec, up to 30 on RLE.     Heel Walk (Round Trip)  2 ft, tiny steps each direction x 2 reps both Lt/Rt     Toe Walk (Round Trip)  2 ft, tiny steps each direction x 2 reps both Lt/Rt     Other Standing Ankle Exercises  reciprocal pattern over stairs (10 steps total)              PT Long Term Goals - 06/23/18 0944      PT LONG TERM GOAL #1   Title  Decrease Lt ankle pain by 50-75% allowing patient to walk and perform normal functional activities 07/21/18    Time  6    Period  Weeks    Status  Partially Met      PT LONG TERM GOAL #2   Title  5-/5 to 5/5 strength Lt ankle 07/21/18    Time  6    Period  Weeks    Status  Partially Met      PT LONG TERM GOAL #3   Title  Independent in gait without assistive device and without walking boot for home/community distances 07/21/18    Time  6    Period  Weeks    Status  On-going      PT LONG TERM GOAL #4   Title  Independent in HEP 07/21/18    Time  6    Period  Weeks    Status  On-going      PT LONG TERM GOAL #5   Title  Improve FOTO to </= 44% limitation 07/21/18    Time  6    Period  Weeks    Status  On-going            Plan - 06/23/18 0945    Clinical Impression Statement  Pt demonstrated improved Lt ankle strength and ROM; she has partially met her ROM/strength goals. Pt tolerated all exercises well, without increase in pain.  She was able to complete Lt SLS without support for 22 sec today.  Progressing well towards remaining goals.     Rehab Potential  Good  PT Frequency  2x / week    PT Duration  6 weeks    PT Treatment/Interventions  Patient/family education;ADLs/Self Care Home Management;Cryotherapy;Electrical Stimulation;Iontophoresis 15m/ml Dexamethasone;Moist Heat;Ultrasound;Dry needling;Manual techniques;Neuromuscular re-education;Gait training;Stair training;Therapeutic activities;Therapeutic exercise;Balance training    PT  Next Visit Plan  continue gentle progression of exercise for Lt ankle for strengthening and ROM.      Consulted and Agree with Plan of Care  Patient       Patient will benefit from skilled therapeutic intervention in order to improve the following deficits and impairments:  Pain, Abnormal gait, Decreased strength, Decreased mobility, Decreased range of motion, Decreased activity tolerance  Visit Diagnosis: Acute left ankle pain  Muscle weakness (generalized)  Other abnormalities of gait and mobility     Problem List Patient Active Problem List   Diagnosis Date Noted  . Encounter for long-term (current) use of medications 05/24/2018  . Left ankle injury, initial encounter 05/24/2018  . Impacted cerumen of left ear 05/24/2018  . Elevated blood pressure reading in office with diagnosis of hypertension 05/24/2018  . Normocytic anemia 02/22/2018  . Encounter for monitoring statin therapy 02/22/2018  . Dyslipidemia 02/22/2018  . Blister of right heel 01/04/2018  . Primary osteoarthritis of right knee 12/16/2017  . Benign hypertension with chronic kidney disease, stage III ([place] 12/04/2017  . Primary osteoarthritis involving multiple joints 12/04/2017  . History of echocardiogram 11/07/2017  . CKD (chronic kidney disease) stage 3, GFR 30-59 ml/min (HCC) 11/02/2017  . 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 Gallstones 06/22/2017  . Elevated serum creatinine 06/15/2017  . Elevated alkaline phosphatase level 06/15/2017  . Transaminitis 06/15/2017  . Acquired hypothyroidism 05/26/2017  . Essential hypertension 05/26/2017  . H/O total knee replacement, left 05/26/2017  . History of adrenal insufficiency 05/26/2017  . Rheumatoid arthritis involving multiple sites ([place] 05/26/2017  . Osteoarthritis of right knee 05/26/2017  . Allergic rhinitis 05/26/2017  . Right carotid bruit 05/26/2017  . Carotid stenosis, asymptomatic, bilateral 05/26/2017  . Allergy to honey bee venom  05/26/2017   JKerin Perna PTA 06/23/18 9:52 AM  [facility] [place] [address]Phone: [phone]  Fax:  [phone] Name: MLoni DelbridgeMRN: [id number] of Birth: 520-Jan-1952

## 2018-06-26 ENCOUNTER — Ambulatory Visit: Payer: Medicare HMO | Admitting: Physical Therapy

## 2018-06-26 DIAGNOSIS — R2689 Other abnormalities of gait and mobility: Secondary | ICD-10-CM

## 2018-06-26 DIAGNOSIS — M25572 Pain in left ankle and joints of left foot: Secondary | ICD-10-CM

## 2018-06-26 DIAGNOSIS — M6281 Muscle weakness (generalized): Secondary | ICD-10-CM

## 2018-06-26 NOTE — Therapy (Signed)
[place] [address] [place], [place], [address] Phone: [phone]   Fax:  [phone]  Physical Therapy Treatment  Patient Details  Name: Melody Moore MRN: [id number] Date of Birth: [birth year] Referring Provider: Dr. Dianah Field   Encounter Date: 06/26/2018  PT End of Session - 06/26/18 0853    Visit Number  6    Number of Visits  12    Date for PT Re-Evaluation  07/21/18    PT Start Time  0849    PT Stop Time  0932   ice pack last 10 min    PT Time Calculation (min)  43 min    Activity Tolerance  Patient tolerated treatment well;No increased pain    Behavior During Therapy  WFL for tasks assessed/performed       Past Medical History:  Diagnosis Date  . Adrenal crisis ([place]) 02/12/2016  . AKI (acute kidney injury) ([place]) 02/2016  . Allergy   . Arthritis   . Carotid stenosis, asymptomatic, bilateral 05/26/2017   Dopplers 02/12/2016: right - mid-distal 40-59% stenosis; left - 40-59% stenosis  . Cataract   . CKD (chronic kidney disease) stage 3, GFR 30-59 ml/min (HCC) 11/02/2017  . Decreased calculated GFR 06/15/2017  . Gallstones 06/22/2017  . History of echocardiogram 11/07/2017   04/09/2016: EF 70%, mild diastolic dysfunction, trivial MR and TR  . Hypertension   . Hypothyroidism   . 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 Rheumatoid arthritis ([place])   . Rheumatoid arthritis involving multiple sites ([place]) 05/26/2017  . Transaminitis 06/15/2017   Normal abdominal US 06/22/17    Past Surgical History:  Procedure Laterality Date  . APPENDECTOMY    . CATARACT EXTRACTION, BILATERAL  2015  . 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  . TOTAL KNEE ARTHROPLASTY Right 12/16/2017   Procedure: RIGHT TOTAL KNEE ARTHROPLASTY;  Surgeon: Dorna Leitz, MD;  Location: [facility];  Service: Orthopedics;  Laterality: Right;  Adductor Block    There were no vitals filed for this visit.  Subjective Assessment - 06/26/18  0853    Subjective  "It feels really good today, and I even walked this weekend (with boot on)".  She continues to ice ankle in evenings.  She believes the patch helped, but that she doesn't need another.     Patient Stated Goals  get rid of the ankle pain and walk normally     Currently in Pain?  No/denies    Pain Score  0-No pain         [facility] PT Assessment - 06/26/18 0001      Assessment   Medical Diagnosis  Lt ankle sprain     Referring Provider  Dr. Dianah Field    Onset Date/Surgical Date  05/07/18    Hand Dominance  Right    Next MD Visit  07/05/18       [facility] Adult PT Treatment/Exercise - 06/26/18 0001      Modalities   Modalities  Cryotherapy      Cryotherapy   Number Minutes Cryotherapy  10 Minutes    Cryotherapy Location  Ankle   Lt    Type of Cryotherapy  Ice pack      Iontophoresis   Type of Iontophoresis  --   pt declined     Manual Therapy   Soft tissue mobilization  gentle soft tissue work to dorsum of foot and lateral Lt ankle    Passive ROM  PROM/gentle stretch Lt ankle and forefoot  Ankle Exercises: Standing   SLS  SLS without UE support x 15-30 sec each leg x 2 reps each.     Heel Walk (Round Trip)  4 ft, tiny side steps with UE support x 2 reps each direction    Toe Walk (Round Trip)  4 ft, tiny side steps with UE support x 2 reps each direction    Other Standing Ankle Exercises  step down and retro step up on 3" step x 8 reps, 10 reps on 6" step.       Ankle Exercises: Seated   Ankle Circles/Pumps  AROM;Left;10 reps   improved.    Other Seated Ankle Exercises  Lt/Rt foot eversion, inversion, DF with red band x 15 (tactile cues for form)    Other Seated Ankle Exercises  Lt foot inversion/eversion and PF/DF with foot on 1/2 foam roller 20 reps each direction      Ankle Exercises: Aerobic   Stationary Bike  L1: 5 min       Ankle Exercises: Stretches   Soleus Stretch  2 reps;30 seconds   each leg   Gastroc Stretch  3 reps;30 seconds   each  leg, less discomfort than last week           PT Long Term Goals - 06/26/18 0909      PT LONG TERM GOAL #1   Title  Decrease Lt ankle pain by 50-75% allowing patient to walk and perform normal functional activities 07/21/18    Time  6    Period  Weeks    Status  Partially Met      PT LONG TERM GOAL #2   Title  5-/5 to 5/5 strength Lt ankle 07/21/18    Time  6    Period  Weeks    Status  Partially Met      PT LONG TERM GOAL #3   Title  Independent in gait without assistive device and without walking boot for home/community distances 07/21/18    Time  6    Period  Weeks    Status  On-going      PT LONG TERM GOAL #4   Title  Independent in HEP 07/21/18    Time  6    Period  Weeks    Status  On-going      PT LONG TERM GOAL #5   Title  Improve FOTO to </= 44% limitation 07/21/18    Time  6    Period  Weeks    Status  On-going      PT LONG TERM GOAL #6   Title  Increase AROM Lt ankle by 2-5 degrees in all planes 07/21/18    Time  6    Period  Weeks    Status  Achieved            Plan - 06/26/18 0347    Clinical Impression Statement  Pt now reporting that she is pain free in her Lt ankle.  Pt interested in weaning from boot; requested she get clearance from MD for weaning.  Cautioned pt to not overdo it now that her symptoms have decreased.  She tolerated all exercises well, without increase in symptoms.  Pt has met LTG#6 and is making great gains towards remaining goals.     Rehab Potential  Good    PT Frequency  2x / week    PT Duration  6 weeks    PT Treatment/Interventions  Patient/family education;ADLs/Self Care Home Management;Cryotherapy;Electrical Stimulation;Iontophoresis 25m/ml  Dexamethasone;Moist Heat;Ultrasound;Dry needling;Manual techniques;Neuromuscular re-education;Gait training;Stair training;Therapeutic activities;Therapeutic exercise;Balance training    PT Next Visit Plan  continue gentle progression of exercise for Lt ankle for strengthening and  ROM.      Consulted and Agree with Plan of Care  Patient       Patient will benefit from skilled therapeutic intervention in order to improve the following deficits and impairments:  Pain, Abnormal gait, Decreased strength, Decreased mobility, Decreased range of motion, Decreased activity tolerance  Visit Diagnosis: Acute left ankle pain  Muscle weakness (generalized)  Other abnormalities of gait and mobility     Problem List Patient Active Problem List   Diagnosis Date Noted  . Encounter for long-term (current) use of medications 05/24/2018  . Left ankle injury, initial encounter 05/24/2018  . Impacted cerumen of left ear 05/24/2018  . Elevated blood pressure reading in office with diagnosis of hypertension 05/24/2018  . Normocytic anemia 02/22/2018  . Encounter for monitoring statin therapy 02/22/2018  . Dyslipidemia 02/22/2018  . Blister of right heel 01/04/2018  . Primary osteoarthritis of right knee 12/16/2017  . Benign hypertension with chronic kidney disease, stage III ([place]) 12/04/2017  . Primary osteoarthritis involving multiple joints 12/04/2017  . History of echocardiogram 11/07/2017  . CKD (chronic kidney disease) stage 3, GFR 30-59 ml/min (HCC) 11/02/2017  . 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 Gallstones 06/22/2017  . Elevated serum creatinine 06/15/2017  . Elevated alkaline phosphatase level 06/15/2017  . Transaminitis 06/15/2017  . Acquired hypothyroidism 05/26/2017  . Essential hypertension 05/26/2017  . H/O total knee replacement, left 05/26/2017  . History of adrenal insufficiency 05/26/2017  . Rheumatoid arthritis involving multiple sites ([place]) 05/26/2017  . Osteoarthritis of right knee 05/26/2017  . Allergic rhinitis 05/26/2017  . Right carotid bruit 05/26/2017  . Carotid stenosis, asymptomatic, bilateral 05/26/2017  . Allergy to honey bee venom 05/26/2017   Kerin Perna, PTA 06/26/18 9:26 AM  [place], [place], [address] Phone: [phone]   Fax:  [phone]  Name: Melody Moore MRN: [id number] Date of Birth: Jul 10, 1951

## 2018-06-30 ENCOUNTER — Ambulatory Visit: Payer: Medicare HMO | Admitting: Rehabilitative and Restorative Service Providers"

## 2018-06-30 ENCOUNTER — Encounter: Payer: Self-pay | Admitting: Rehabilitative and Restorative Service Providers"

## 2018-06-30 DIAGNOSIS — M25561 Pain in right knee: Secondary | ICD-10-CM

## 2018-06-30 DIAGNOSIS — G8929 Other chronic pain: Secondary | ICD-10-CM | POA: Diagnosis not present

## 2018-06-30 DIAGNOSIS — M6281 Muscle weakness (generalized): Secondary | ICD-10-CM | POA: Diagnosis not present

## 2018-06-30 DIAGNOSIS — R2689 Other abnormalities of gait and mobility: Secondary | ICD-10-CM | POA: Diagnosis not present

## 2018-06-30 DIAGNOSIS — M25572 Pain in left ankle and joints of left foot: Secondary | ICD-10-CM

## 2018-06-30 NOTE — Therapy (Addendum)
[facility] [place], [place], [address] Phone: [phone]   Fax:  [phone]  Physical Therapy Treatment  Patient Details  Name: Melody Moore MRN: [id number] Date of Birth: 1951-08-23 Referring Provider (PT): Dr Dianah Field   Encounter Date: 06/30/2018  PT End of Session - 06/30/18 0847    Visit Number  7    Number of Visits  12    Date for PT Re-Evaluation  07/21/18    PT Start Time  0836    PT Stop Time  0920    PT Time Calculation (min)  44 min    Activity Tolerance  Patient tolerated treatment well       Past Medical History:  Diagnosis Date  . Adrenal crisis ([place]) 02/12/2016  . AKI (acute kidney injury) ([place]) 02/2016  . Allergy   . Arthritis   . Carotid stenosis, asymptomatic, bilateral 05/26/2017   Dopplers 02/12/2016: right - mid-distal 40-59% stenosis; left - 40-59% stenosis  . Cataract   . CKD (chronic kidney disease) stage 3, GFR 30-59 ml/min (HCC) 11/02/2017  . Decreased calculated GFR 06/15/2017  . Gallstones 06/22/2017  . History of echocardiogram 11/07/2017   04/09/2016: EF 92%, mild diastolic dysfunction, trivial MR and TR  . Hypertension   . Hypothyroidism   . 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 Rheumatoid arthritis ([place])   . Rheumatoid arthritis involving multiple sites ([place]) 05/26/2017  . Transaminitis 06/15/2017   Normal abdominal US 06/22/17    Past Surgical History:  Procedure Laterality Date  . APPENDECTOMY    . CATARACT EXTRACTION, BILATERAL  2015  . 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  . TOTAL KNEE ARTHROPLASTY Right 12/16/2017   Procedure: RIGHT TOTAL KNEE ARTHROPLASTY;  Surgeon: Dorna Leitz, MD;  Location: [facility];  Service: Orthopedics;  Laterality: Right;  Adductor Block    There were no vitals filed for this visit.  Subjective Assessment - 06/30/18 0847    Subjective  Patient reports that she is pain free in the Rt foot/ankle. She is still using the           [facility] PT Assessment - 06/30/18 0001      Assessment   Medical Diagnosis  Lt ankle sprain     Referring Provider (PT)  Dr Dianah Field    Onset Date/Surgical Date  05/07/18    Hand Dominance  Right    Next MD Visit  07/05/18    Prior Therapy  07/05/18      Precautions   Precaution Comments  in walking boot       Observation/Other Assessments   Focus on Therapeutic Outcomes (FOTO)   36% limitation       Sensation   Additional Comments  WFL's per pt report       AROM   Left Ankle Dorsiflexion  15    Left Ankle Plantar Flexion  51    Left Ankle Inversion  30    Left Ankle Eversion  18      Strength   Left Ankle Dorsiflexion  5/5    Left Ankle Plantar Flexion  4+/5    Left Ankle Inversion  5/5    Left Ankle Eversion  5/5      Ambulation/Gait   Gait Comments  WFL's ambulating in clinicwithout boot - in shoes                    [facility] PT Treatment/Exercise - 06/30/18 0001  Manual Therapy   Soft tissue mobilization  soft tissue work to dorsum of foot and lateral Lt ankle    Passive ROM  PROM/ stretch Lt ankle and forefoot      Ankle Exercises: Aerobic   Nustep  L5 x 6 min (UE 10)      Ankle Exercises: Stretches   Soleus Stretch  2 reps;30 seconds   each leg   Gastroc Stretch  3 reps;30 seconds   each leg, less discomfort than last week     Ankle Exercises: Standing   SLS  SLS without UE support x 15-30 sec each leg x 3 reps each.     Heel Walk (Round Trip)  10 ft, small side steps with UE support x 2 reps each direction no UE support     Toe Walk (Round Trip)  10 ft, heel to toe then bqckward walking with UE support x 3 reps each direction    Other Standing Ankle Exercises  step up 6 in step x 10 each LE       Ankle Exercises: Seated   Ankle Circles/Pumps  AROM;Left;10 reps   improved.    Other Seated Ankle Exercises  Lt/Rt foot eversion, inversion, DF with red band x 15 (tactile cues for form)   eversion with red theraband fast  reps 10 x 3 sets    Other Seated Ankle Exercises  Lt foot inversion/eversion and PF/DF with foot on 1/2 foam roller 20 reps each direction               PT Short Term Goals - 06/09/18 1036      PT SHORT TERM GOAL #1   Title  -    Time  0    Period  Days    Status  Unable to assess      PT SHORT TERM GOAL #2   Title  -    Time  0    Period  Days    Status  Unable to assess      PT SHORT TERM GOAL #3   Title  -    Time  0    Period  Days    Status  Unable to assess        PT Long Term Goals - 06/30/18 0924      PT LONG TERM GOAL #1   Title  Decrease Lt ankle pain by 50-75% allowing patient to walk and perform normal functional activities 07/21/18    Time  6    Period  Weeks    Status  Achieved      PT LONG TERM GOAL #2   Title  5-/5 to 5/5 strength Lt ankle 07/21/18    Time  6    Period  Weeks    Status  Achieved      PT LONG TERM GOAL #3   Title  Independent in gait without assistive device and without walking boot for home/community distances 07/21/18    Time  6    Period  Weeks    Status  On-going      PT LONG TERM GOAL #4   Title  Independent in HEP 07/21/18    Time  6    Period  Weeks    Status  Achieved      PT LONG TERM GOAL #5   Title  Improve FOTO to </= 44% limitation 07/21/18    Time  6    Period  Weeks    Status  Achieved      PT LONG TERM GOAL #6   Title  Increase AROM Lt ankle by 2-5 degrees in all planes 07/21/18    Time  6    Period  Weeks    Status  Achieved            Plan - 06/30/18 0850    Clinical Impression Statement  Excellent progress. Melody Moore is pain free Lt ankle. she continues to use her walking boot for ambulation. Returns to MD 07/05/18. Goals of therapy have been accomplished. Note to MD.     Rehab Potential  Good    PT Frequency  2x / week    PT Duration  6 weeks    PT Treatment/Interventions  Patient/family education;ADLs/Self Care Home Management;Cryotherapy;Electrical Stimulation;Iontophoresis 71m/ml  Dexamethasone;Moist Heat;Ultrasound;Dry needling;Manual techniques;Neuromuscular re-education;Gait training;Stair training;Therapeutic activities;Therapeutic exercise;Balance training    PT Next Visit Plan  continue gentle progression of exercise for Lt ankle for strengthening and ROM as ordered. Note to MD - await further orders to continue PT     Consulted and Agree with Plan of Care  Patient       Patient will benefit from skilled therapeutic intervention in order to improve the following deficits and impairments:  Pain, Abnormal gait, Decreased strength, Decreased mobility, Decreased range of motion, Decreased activity tolerance  Visit Diagnosis: Acute left ankle pain  Muscle weakness (generalized)  Other abnormalities of gait and mobility  Chronic pain of right knee     Problem List Patient Active Problem List   Diagnosis Date Noted  . Encounter for long-term (current) use of medications 05/24/2018  . Left ankle injury, initial encounter 05/24/2018  . Impacted cerumen of left ear 05/24/2018  . Elevated blood pressure reading in office with diagnosis of hypertension 05/24/2018  . Normocytic anemia 02/22/2018  . Encounter for monitoring statin therapy 02/22/2018  . Dyslipidemia 02/22/2018  . Blister of right heel 01/04/2018  . Primary osteoarthritis of right knee 12/16/2017  . Benign hypertension with chronic kidney disease, stage III ([place] 12/04/2017  . Primary osteoarthritis involving multiple joints 12/04/2017  . History of echocardiogram 11/07/2017  . CKD (chronic kidney disease) stage 3, GFR 30-59 ml/min (HCC) 11/02/2017  . 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 Gallstones 06/22/2017  . Elevated serum creatinine 06/15/2017  . Elevated alkaline phosphatase level 06/15/2017  . Transaminitis 06/15/2017  . Acquired hypothyroidism 05/26/2017  . Essential hypertension 05/26/2017  . H/O total knee replacement, left 05/26/2017  . History of adrenal  insufficiency 05/26/2017  . Rheumatoid arthritis involving multiple sites ([place] 05/26/2017  . Osteoarthritis of right knee 05/26/2017  . Allergic rhinitis 05/26/2017  . Right carotid bruit 05/26/2017  . Carotid stenosis, asymptomatic, bilateral 05/26/2017  . Allergy to honey bee venom 05/26/2017    Celyn PNilda SimmerPT, MPH  06/30/2018, 9:30 AM  [facility] [address] [address]Phone: [phone]  Fax:  [phone] Name: MCybele MauleMRN: [id number] of Birth: [birth year] PHYSICAL THERAPY DISCHARGE SUMMARY  Visits from Start of Care: 7  Current functional level related to goals / functional outcomes: See last progress note for discharge status   Remaining deficits: Unknown  Education / Equipment: HEP  Plan: Patient agrees to discharge.  Patient goals were not met. Patient is being discharged due to meeting the stated rehab goals.  ?????     Celyn P. HHelene KelpPT, MPH 07/19/18 12:56 PM

## 2018-07-02 ENCOUNTER — Other Ambulatory Visit: Payer: Self-pay | Admitting: Physician Assistant

## 2018-07-02 DIAGNOSIS — N183 Chronic kidney disease, stage 3 unspecified: Secondary | ICD-10-CM

## 2018-07-02 DIAGNOSIS — I129 Hypertensive chronic kidney disease with stage 1 through stage 4 chronic kidney disease, or unspecified chronic kidney disease: Secondary | ICD-10-CM

## 2018-07-05 ENCOUNTER — Encounter: Payer: Self-pay | Admitting: Sports Medicine

## 2018-07-05 ENCOUNTER — Ambulatory Visit (INDEPENDENT_AMBULATORY_CARE_PROVIDER_SITE_OTHER): Payer: Medicare HMO | Admitting: Sports Medicine

## 2018-07-05 DIAGNOSIS — S99912A Unspecified injury of left ankle, initial encounter: Secondary | ICD-10-CM | POA: Diagnosis not present

## 2018-07-05 NOTE — Progress Notes (Signed)
Subjective:    CC: Follow-up  HPI: Left ankle injury: Now resolved.  I reviewed the past medical history, family history, social history, surgical history, and allergies today and no changes were needed.  Please see the problem list section below in epic for further details.  Past Medical History: Past Medical History:  Diagnosis Date  . 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 AKI (acute kidney injury) ([place]) 02/2016  . Allergy   . Arthritis   . Carotid stenosis, asymptomatic, bilateral 05/26/2017   Dopplers 02/12/2016: right - mid-distal 40-59% stenosis; left - 40-59% stenosis  . Cataract   . CKD (chronic kidney disease) stage 3, GFR 30-59 ml/min (HCC) 11/02/2017  . Decreased calculated GFR 06/15/2017  . Gallstones 06/22/2017  . History of echocardiogram 11/07/2017   04/09/2016: EF 07%, mild diastolic dysfunction, trivial MR and TR  . Hypertension   . Hypothyroidism   . 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 Rheumatoid arthritis ([place])   . Rheumatoid arthritis involving multiple sites ([place]) 05/26/2017  . Transaminitis 06/15/2017   Normal abdominal US 06/22/17   Past Surgical History: Past Surgical History:  Procedure Laterality Date  . APPENDECTOMY    . CATARACT EXTRACTION, BILATERAL  2015  . 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  . TOTAL KNEE ARTHROPLASTY Right 12/16/2017   Procedure: RIGHT TOTAL KNEE ARTHROPLASTY;  Surgeon: Dorna Leitz, MD;  Location: [facility];  Service: Orthopedics;  Laterality: Right;  Adductor Block   Social History: Social History   Socioeconomic History  . Marital status: Widowed    Spouse name: Not on file  . Number of children: Not on file  . Years of education: Not on file  . Highest education level: Not on file  Occupational History  . Not on file  Social Needs  . Financial resource strain: Not on file  . Food insecurity:    Worry: Not on file    Inability: Not on file  . Transportation needs:    Medical: Not on file    Non-medical: Not  on file  Tobacco Use  . Smoking status: Former Smoker    Types: Cigarettes    Last attempt to quit: 02/07/1974    Years since quitting: 44.4  . Smokeless tobacco: Never Used  Substance and Sexual Activity  . Alcohol use: Yes    Alcohol/week: 1.0 standard drinks    Types: 1 Glasses of wine per week  . Drug use: No  . Sexual activity: Yes  Lifestyle  . Physical activity:    Days per week: Not on file    Minutes per session: Not on file  . Stress: Not on file  Relationships  . Social connections:    Talks on phone: Not on file    Gets together: Not on file    Attends religious service: Not on file    Active member of club or organization: Not on file    Attends meetings of clubs or organizations: Not on file    Relationship status: Not on file  Other Topics Concern  . Not on file  Social History Narrative  . Not on file   Family History: Family History  Problem Relation Age of Onset  . Diabetes Mother   . Lung cancer Father   . Hypertension Father    Allergies: Allergies  Allergen Reactions  . Bee Venom Anaphylaxis  . Oxycodone Other (See Comments)    Adrenal crisis while taking both prednisone and oxycodone   . Prednisone  Adrenal crisis while taking both prednisone and oxycodone   . Verapamil Hives  . Nickel Rash  . Nitrofuran Derivatives Rash   Medications: See med rec.  Review of Systems: No fevers, chills, night sweats, weight loss, chest pain, or shortness of breath.   Objective:    General: Well Developed, well nourished, and in no acute distress.  Neuro: Alert and oriented x3, extra-ocular muscles intact, sensation grossly intact.  HEENT: Normocephalic, atraumatic, pupils equal round reactive to light, neck supple, no masses, no lymphadenopathy, thyroid nonpalpable.  Skin: Warm and dry, no rashes. Cardiac: Regular rate and rhythm, no murmurs rubs or gallops, no lower extremity edema.  Respiratory: Clear to auscultation bilaterally. Not using  accessory muscles, speaking in full sentences. Left ankle: No visible erythema or swelling. Range of motion is full in all directions. Strength is 5/5 in all directions. Stable lateral and medial ligaments; squeeze test and kleiger test unremarkable; Talar dome nontender; No pain at base of 5th MT; No tenderness over cuboid; No tenderness over N spot or navicular prominence No tenderness on posterior aspects of lateral and medial malleolus No sign of peroneal tendon subluxations; Negative tarsal tunnel tinel's Able to walk 4 steps.  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 She did not tolerate an ASO so we switched her to a cam boot approximately a month ago, she did extremely well, has done several sessions of therapy and is now pain-free. Discontinue boot, discontinue physical therapy, continue exercises at home and return to see me as needed. ___________________________________________ Gwen Her. Dianah Field, M.D., ABFM., CAQSM. Primary Care and [employer] In[employer] of [employer] [facility] [employer]

## 2018-07-05 NOTE — Assessment & Plan Note (Signed)
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 She did not tolerate an ASO so we switched her to a cam boot approximately a month ago, she did extremely well, has done several sessions of therapy and is now pain-free. Discontinue boot, discontinue physical therapy, continue exercises at home and return to see me as needed.

## 2018-07-06 DIAGNOSIS — M25561 Pain in right knee: Secondary | ICD-10-CM | POA: Diagnosis not present

## 2018-07-20 ENCOUNTER — Other Ambulatory Visit: Payer: Self-pay | Admitting: Sports Medicine

## 2018-07-26 ENCOUNTER — Other Ambulatory Visit: Payer: Self-pay | Admitting: Physician Assistant

## 2018-07-26 DIAGNOSIS — J309 Allergic rhinitis, unspecified: Secondary | ICD-10-CM

## 2018-08-16 DIAGNOSIS — M79672 Pain in left foot: Secondary | ICD-10-CM | POA: Diagnosis not present

## 2018-08-16 DIAGNOSIS — M150 Primary generalized (osteo)arthritis: Secondary | ICD-10-CM | POA: Diagnosis not present

## 2018-08-16 DIAGNOSIS — M79671 Pain in right foot: Secondary | ICD-10-CM | POA: Diagnosis not present

## 2018-08-16 DIAGNOSIS — M069 Rheumatoid arthritis, unspecified: Secondary | ICD-10-CM | POA: Diagnosis not present

## 2018-08-16 DIAGNOSIS — Z6833 Body mass index (BMI) 33.0-33.9, adult: Secondary | ICD-10-CM | POA: Diagnosis not present

## 2018-08-16 DIAGNOSIS — R5383 Other fatigue: Secondary | ICD-10-CM | POA: Diagnosis not present

## 2018-08-16 DIAGNOSIS — M353 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: Secondary | ICD-10-CM | POA: Diagnosis not present

## 2018-08-16 DIAGNOSIS — E669 Obesity, unspecified: Secondary | ICD-10-CM | POA: Diagnosis not present

## 2018-09-26 ENCOUNTER — Other Ambulatory Visit: Payer: Self-pay | Admitting: Physician Assistant

## 2018-10-19 ENCOUNTER — Other Ambulatory Visit: Payer: Self-pay | Admitting: Sports Medicine

## 2018-11-12 ENCOUNTER — Other Ambulatory Visit: Payer: Self-pay | Admitting: Physician Assistant

## 2018-11-21 DIAGNOSIS — R5383 Other fatigue: Secondary | ICD-10-CM | POA: Diagnosis not present

## 2018-11-21 DIAGNOSIS — M353 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: Secondary | ICD-10-CM | POA: Diagnosis not present

## 2018-11-21 DIAGNOSIS — Z6833 Body mass index (BMI) 33.0-33.9, adult: Secondary | ICD-10-CM | POA: Diagnosis not present

## 2018-11-21 DIAGNOSIS — E669 Obesity, unspecified: Secondary | ICD-10-CM | POA: Diagnosis not present

## 2018-11-21 DIAGNOSIS — M150 Primary generalized (osteo)arthritis: Secondary | ICD-10-CM | POA: Diagnosis not present

## 2018-11-21 DIAGNOSIS — M25472 Effusion, left ankle: Secondary | ICD-10-CM | POA: Diagnosis not present

## 2018-11-21 DIAGNOSIS — M069 Rheumatoid arthritis, unspecified: Secondary | ICD-10-CM | POA: Diagnosis not present

## 2018-12-18 ENCOUNTER — Other Ambulatory Visit: Payer: Self-pay | Admitting: Physician Assistant

## 2018-12-18 NOTE — Progress Notes (Signed)
Subjective:   Melody Moore is a 68 y.o. female who presents for an Initial Medicare Annual Wellness Visit.  Review of Systems    No ROS.  Medicare Wellness Visit. Additional risk factors are reflected in the social history.   Cardiac Risk Factors include: advanced age (>28men, >37 women);hypertension;dyslipidemia Sleep patterns: Getting on average 5 hours of sleep a night. Wakes up 2 times a night to void. When wakes up feels rested. Her sleeping med she doesn't feel like it is working as well as it did. Advised patient to try Melatonin OTC for her sleep. Home Safety/Smoke Alarms: Feels safe in home. Smoke alarms in place.  Living environment; Lives with fiancee in 2 story home with steps and handrails are in place. Shower is a walk in shower and grab bars are in place. Seat Belt Safety/Bike Helmet: Wears seat belt.   Female:   Pap- aged out     [name]-  utd     Dexa scan-   Per patient had one done in 2019 in [place]-  utd     Objective:    Today's Vitals   12/19/18 0848  BP: 120/64  Pulse: 72  SpO2: 98%  Weight: 208 lb (94.3 kg)  Height: 5\' 3"  (1.6 m)   Body mass index is 36.85 kg/m.  Advanced Directives 12/19/2018 06/09/2018 01/09/2018 12/23/2017 12/16/2017 12/16/2017 12/09/2017  Does Patient Have a Medical Advance Directive? Yes Yes Yes Yes No No No  Type of Paramedic of [place];Living will [place];Living will [place];Living will [place] - - -  Does patient want to make changes to medical advance directive? No - Patient declined - - No - Patient declined - - -  Copy of [place] in Chart? Yes - validated most recent copy scanned in chart (See row information) Yes Yes No - copy requested - - -  Would patient like information on creating a medical advance directive? - - - - No - Patient declined No - Patient declined No - Patient declined    Current Medications  (verified) Outpatient Encounter Medications as of 12/19/2018  Medication Sig  . acetaminophen (TYLENOL) 650 MG CR tablet Take 2 tablets (1,300 mg total) by mouth 2 (two) times daily as needed for pain.  Marland Kitchen amLODipine (NORVASC) 5 MG tablet TAKE 1 TABLET BY MOUTH EVERY DAY  . aspirin EC 81 MG tablet Take 81 mg by mouth daily.  Marland Kitchen atorvastatin (LIPITOR) 40 MG tablet TAKE 1 TABLET BY MOUTH EVERYDAY AT BEDTIME  . Calcium Carb-Cholecalciferol (CALCIUM 600 + D PO) Take 2 tablets by mouth daily.  . cetirizine (ZYRTEC) 10 MG tablet Take 1 tablet (10 mg total) by mouth daily as needed for allergies.  . Cholecalciferol (VITAMIN D) 2000 units tablet Take 2,000 Units by mouth daily.  Marland Kitchen docusate sodium (COLACE) 100 MG capsule Take 100 mg by mouth 2 (two) times daily.  Marland Kitchen EPINEPHrine (EPIPEN 2-PAK) 0.3 mg/0.3 mL IJ SOAJ injection Inject 0.3 mLs (0.3 mg total) into the muscle once as needed. (Patient taking differently: Inject 0.3 mg into the muscle once as needed (bee stings). )  . ferrous sulfate 325 (65 FE) MG tablet Take 325 mg by mouth 2 (two) times daily.  . fluticasone (FLONASE) 50 MCG/ACT nasal spray Place 1 spray into both nostrils daily as needed for allergies.  Marland Kitchen gabapentin (NEURONTIN) 300 MG capsule TAKE ONE CAPSULE BY MOUTH AT BEDTIME  .  hydroxychloroquine (PLAQUENIL) 200 MG tablet Take 200 mg by mouth 2 (two) times daily.   Marland Kitchen levothyroxine (SYNTHROID, LEVOTHROID) 50 MCG tablet Take 1 tablet (50 mcg total) by mouth daily before breakfast.  . Olopatadine HCl 0.2 % SOLN Place 1 drop into both eyes daily as needed (allergies).  . quinapril (ACCUPRIL) 40 MG tablet TAKE 1 TABLET BY MOUTH EVERY DAY  . traZODone (DESYREL) 100 MG tablet TAKE 0.5-1 TABLETS (50-100 MG TOTAL) BY MOUTH AT BEDTIME AS NEEDED FOR SLEEP.  . Diclofenac Sodium 2 % SOLN Place 2 sprays onto the skin 2 (two) times daily. (Patient not taking: Reported on 12/19/2018)  . leucovorin (WELLCOVORIN) 5 MG tablet Take 5 mg by mouth every Monday,  Tuesday, Wednesday, Thursday, and Friday. Except for Saturday/Sunday  . ondansetron (ZOFRAN) 4 MG tablet   . [DISCONTINUED] diazepam (VALIUM) 5 MG tablet Take 1 tab PO 1 hour before procedure or imaging.  . [DISCONTINUED] fluticasone (FLONASE) 50 MCG/ACT nasal spray USE 1 SPRAY INTO EACH NOSTRIL EVERY DAY  . [DISCONTINUED] methotrexate (RHEUMATREX) 2.5 MG tablet Take 7 tablets by mouth once a week.   No facility-administered encounter medications on file as of 12/19/2018.     Allergies (verified) Bee venom; Oxycodone; Prednisone; Verapamil; Nickel; and Nitrofuran derivatives   History: Past Medical History:  Diagnosis Date  . 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 AKI (acute kidney injury) ([place]) 02/2016  . Allergy   . Arthritis   . Carotid stenosis, asymptomatic, bilateral 05/26/2017   Dopplers 02/12/2016: right - mid-distal 40-59% stenosis; left - 40-59% stenosis  . Cataract   . CKD (chronic kidney disease) stage 3, GFR 30-59 ml/min (HCC) 11/02/2017  . Decreased calculated GFR 06/15/2017  . Gallstones 06/22/2017  . History of echocardiogram 11/07/2017   04/09/2016: EF 19%, mild diastolic dysfunction, trivial MR and TR  . Hypertension   . Hypothyroidism   . 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 Rheumatoid arthritis ([place])   . Rheumatoid arthritis involving multiple sites ([place]) 05/26/2017  . Transaminitis 06/15/2017   Normal abdominal US 06/22/17   Past Surgical History:  Procedure Laterality Date  . APPENDECTOMY    . CATARACT EXTRACTION, BILATERAL  2015  . 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  . TOTAL KNEE ARTHROPLASTY Right 12/16/2017   Procedure: RIGHT TOTAL KNEE ARTHROPLASTY;  Surgeon: Dorna Leitz, MD;  Location: [facility];  Service: Orthopedics;  Laterality: Right;  Adductor Block   Family History  Problem Relation Age of Onset  . Diabetes Mother   . Lung cancer Father   . Hypertension Father    Social History   Socioeconomic History  . Marital status: Widowed     Spouse name: Not on file  . Number of children: 0  . Years of education: 16  . Highest education level: Bachelor's degree (e.g., BA, AB, BS)  Occupational History  . Occupation: [employer]    Comment: retired  Scientific laboratory technician  . Financial resource strain: Not hard at all  . Food insecurity:    Worry: Never true    Inability: Never true  . Transportation needs:    Medical: No    Non-medical: No  Tobacco Use  . Smoking status: Former Smoker    Types: Cigarettes    Last attempt to quit: 02/07/1974    Years since quitting: 44.8  . Smokeless tobacco: Never Used  Substance and Sexual Activity  . Alcohol use: Yes    Alcohol/week: 1.0 standard drinks    Types: 1 Glasses  of wine per week    Comment: occasionally  . Drug use: No  . Sexual activity: Yes  Lifestyle  . Physical activity:    Days per week: 7 days    Minutes per session: 20 min  . Stress: Not at all  Relationships  . Social connections:    Talks on phone: More than three times a week    Gets together: Twice a week    Attends religious service: More than 4 times per year    Active member of club or organization: No    Attends meetings of clubs or organizations: Never    Relationship status: Widowed  Other Topics Concern  . Not on file  Social History Narrative   Walks the dog everyday. Runs errands. Caffeine use 2 cups of coffee    Tobacco Counseling Counseling given: Not Answered   Clinical Intake:                        Activities of Daily Living In your present state of health, do you have any difficulty performing the following activities: 12/19/2018  Hearing? N  Vision? N  Comment Has appointment with eye doctor coming up in May  Difficulty concentrating or making decisions? N  Walking or climbing stairs? N  Dressing or bathing? N  Doing errands, shopping? N  Preparing Food and eating ? N  Using the Toilet? N  In the past six months, have you accidently leaked urine? N   Do you have problems with loss of bowel control? N  Managing your Medications? N  Managing your Finances? N  Housekeeping or managing your Housekeeping? N  Some recent data might be hidden     Immunizations and Health Maintenance Immunization History  Administered Date(s) Administered  . Influenza, High Dose Seasonal PF 05/26/2017   Health Maintenance Due  Topic Date Due  . Hepatitis C Screening  02/22/1951  . DEXA SCAN  02/07/2014    Patient Care Team: Kris Mouton as PCP - General (Physician Assistant) Rosita Kea, PA-C as Consulting Physician (Rheumatology)  Indicate any recent Medical Services you may have received from other than [facility] providers in the past year (date may be approximate).     Assessment:   This is a routine wellness examination for [place].Physical assessment deferred to PCP.   Hearing/Vision screen  Visual Acuity Screening   Right eye Left eye Both eyes  Without correction:     With correction: 20/40 20/30 20/20   Hearing Screening Comments: Whisper test done and patient reports all 3 words correctly  Dietary issues and exercise activities discussed: Current Exercise Habits: Home exercise routine, Type of exercise: walking, Time (Minutes): 20, Frequency (Times/Week): 7, Weekly Exercise (Minutes/Week): 140, Intensity: Moderate, Exercise limited by: None identified Diet  Eats fairly healthy. Vegetables and fruits. Had discussion about eating carbs and to cut those down in moderation. Breakfast: cereal with milk or egg on english muffin Lunch: egg salad sandwich Dinner: Meat and vegetables and poato      Goals    . DIET - INCREASE WATER INTAKE     Increase water intake to 64 ounces a day.    . Weight (lb) < 200 lb (90.7 kg)     Patient states would like to loose at least 10  Lbs in 2020.      Depression Screen PHQ 2/9 Scores 12/19/2018 05/24/2018 11/25/2017 05/26/2017 05/26/2017  PHQ - 2 Score 0 0 3 0 0  PHQ- 9 Score - - 5  - -    Fall Risk Fall Risk  12/19/2018 11/25/2017 05/26/2017  Falls in the past year? 0 Yes No  Number falls in past yr: - 1 -  Injury with Fall? - No -  Risk for fall due to : - Impaired mobility -  Risk for fall due to: Comment - knee OA -  Follow up Falls prevention discussed Education provided;Falls prevention discussed -    Is the patient's home free of loose throw rugs in walkways, pet beds, electrical cords, etc?   yes      Grab bars in the bathroom? yes      Handrails on the stairs?   yes      Adequate lighting?   yes   Cognitive Function:     6CIT Screen 12/19/2018  What Year? 0 points  What month? 0 points  What time? 0 points  Count back from 20 2 points  Months in reverse 0 points  Repeat phrase 0 points  Total Score 2    Screening Tests Health Maintenance  Topic Date Due  . Hepatitis C Screening  [birth year]  . DEXA SCAN  02/07/2014  . INFLUENZA VACCINE  01/11/2019 (Originally 05/04/2018)  . MAMMOGRAM  06/14/2020  . TETANUS/TDAP  05/26/2022  . COLONOSCOPY  04/04/2027  . PNA vac Low Risk Adult  Completed       Plan:    Ms. Hoefer , Thank you for taking time to come for your Medicare Wellness Visit. I appreciate your ongoing commitment to your health goals. Please review the following plan we discussed and let me know if I can assist you in the future.  Please schedule your next medicare wellness visit with me in 1 yr. Continue doing brain stimulating activities (puzzles, reading, adult coloring books, staying active) to keep memory sharp.       These are the goals we discussed: Goals    . DIET - INCREASE WATER INTAKE     Increase water intake to 64 ounces a day.    . Weight (lb) < 200 lb (90.7 kg)     Patient states would like to loose at least 10  Lbs in 2020.       This is a list of the screening recommended for you and due dates:  Health Maintenance  Topic Date Due  .  Hepatitis C: One time screening is recommended by Center for Disease  Control  (CDC) for  adults born from [birth year] through 1967.   04-May-1951  . DEXA scan (bone density measurement)  02/07/2014  . Flu Shot  01/11/2019*  . Mammogram  06/14/2020  . Tetanus Vaccine  05/26/2022  . Colon Cancer Screening  04/04/2027  . Pneumonia vaccines  Completed  *Topic was postponed. The date shown is not the original due date.     I have personally reviewed and noted the following in the patient's chart:   . Medical and social history . Use of alcohol, tobacco or illicit drugs  . Current medications and supplements . Functional ability and status . Nutritional status . Physical activity . Advanced directives . List of other physicians . Hospitalizations, surgeries, and ER visits in previous 12 months . Vitals . Screenings to include cognitive, depression, and falls . Referrals and appointments  In addition, I have reviewed and discussed with patient certain preventive protocols, quality metrics, and best practice recommendations. A written personalized care plan for preventive services as well as general preventive  health recommendations were provided to patient.     Joanne Chars, LPN   02/03/7740

## 2018-12-19 ENCOUNTER — Other Ambulatory Visit: Payer: Self-pay

## 2018-12-19 ENCOUNTER — Ambulatory Visit (INDEPENDENT_AMBULATORY_CARE_PROVIDER_SITE_OTHER): Payer: Medicare HMO | Admitting: *Deleted

## 2018-12-19 VITALS — BP 120/64 | HR 72 | Ht 63.0 in | Wt 208.0 lb

## 2018-12-19 DIAGNOSIS — Z0000 Encounter for general adult medical examination without abnormal findings: Secondary | ICD-10-CM | POA: Diagnosis not present

## 2018-12-19 DIAGNOSIS — M069 Rheumatoid arthritis, unspecified: Secondary | ICD-10-CM | POA: Diagnosis not present

## 2018-12-19 NOTE — Progress Notes (Signed)
Medical screening examination/treatment was performed by qualified clinical staff member and as supervising physician I was immediately available for consultation/collaboration. I have reviewed documentation and agree with assessment and plan.  Trixie Dredge, [place]

## 2018-12-19 NOTE — Patient Instructions (Signed)
[name] , Thank you for taking time to come for your Medicare Wellness Visit. I appreciate your ongoing commitment to your health goals. Please review the following plan we discussed and let me know if I can assist you in the future.  Please schedule your next medicare wellness visit with me in 1 yr. Continue doing brain stimulating activities (puzzles, reading, adult coloring books, staying active) to keep memory sharp.   These are the goals we discussed: Goals    . DIET - INCREASE WATER INTAKE     Increase water intake to 64 ounces a day.    . Weight (lb) < 200 lb (90.7 kg)     Patient states would like to loose at least 10  Lbs in 2020.     Carbohydrate Counting for Diabetes Mellitus, Adult  Carbohydrate counting is a method of keeping track of how many carbohydrates you eat. Eating carbohydrates naturally increases the amount of sugar (glucose) in the blood. Counting how many carbohydrates you eat helps keep your blood glucose within normal limits, which helps you manage your diabetes (diabetes mellitus). It is important to know how many carbohydrates you can safely have in each meal. This is different for every person. A diet and nutrition specialist (registered dietitian) can help you make a meal plan and calculate how many carbohydrates you should have at each meal and snack. Carbohydrates are found in the following foods:  Grains, such as breads and cereals.  Dried beans and soy products.  Starchy vegetables, such as potatoes, peas, and corn.  Fruit and fruit juices.  Milk and yogurt.  Sweets and snack foods, such as cake, cookies, candy, chips, and soft drinks. How do I count carbohydrates? There are two ways to count carbohydrates in food. You can use either of the methods or a combination of both. Reading "Nutrition Facts" on packaged food The "Nutrition Facts" list is included on the labels of almost all packaged foods and beverages in the [place] It includes:  The  serving size.  Information about nutrients in each serving, including the grams (g) of carbohydrate per serving. To use the "Nutrition Facts":  Decide how many servings you will have.  Multiply the number of servings by the number of carbohydrates per serving.  The resulting number is the total amount of carbohydrates that you will be having. Learning standard serving sizes of other foods When you eat carbohydrate foods that are not packaged or do not include "Nutrition Facts" on the label, you need to measure the servings in order to count the amount of carbohydrates:  Measure the foods that you will eat with a food scale or measuring cup, if needed.  Decide how many standard-size servings you will eat.  Multiply the number of servings by 15. Most carbohydrate-rich foods have about 15 g of carbohydrates per serving. ? For example, if you eat 8 oz (170 g) of strawberries, you will have eaten 2 servings and 30 g of carbohydrates (2 servings x 15 g = 30 g).  For foods that have more than one food mixed, such as soups and casseroles, you must count the carbohydrates in each food that is included. The following list contains standard serving sizes of common carbohydrate-rich foods. Each of these servings has about 15 g of carbohydrates:   hamburger bun or  English muffin.   oz (15 mL) syrup.   oz (14 g) jelly.  1 slice of bread.  1 six-inch tortilla.  3 oz (85 g) cooked  rice or pasta.  4 oz (113 g) cooked dried beans.  4 oz (113 g) starchy vegetable, such as peas, corn, or potatoes.  4 oz (113 g) hot cereal.  4 oz (113 g) mashed potatoes or  of a large baked potato.  4 oz (113 g) canned or frozen fruit.  4 oz (120 mL) fruit juice.  4-6 crackers.  6 chicken nuggets.  6 oz (170 g) unsweetened dry cereal.  6 oz (170 g) plain fat-free yogurt or yogurt sweetened with artificial sweeteners.  8 oz (240 mL) milk.  8 oz (170 g) fresh fruit or one small piece of fruit.   24 oz (680 g) popped popcorn. Example of carbohydrate counting Sample meal  3 oz (85 g) chicken breast.  6 oz (170 g) brown rice.  4 oz (113 g) corn.  8 oz (240 mL) milk.  8 oz (170 g) strawberries with sugar-free whipped topping. Carbohydrate calculation 1. Identify the foods that contain carbohydrates: ? Rice. ? Corn. ? Milk. ? Strawberries. 2. Calculate how many servings you have of each food: ? 2 servings rice. ? 1 serving corn. ? 1 serving milk. ? 1 serving strawberries. 3. Multiply each number of servings by 15 g: ? 2 servings rice x 15 g = 30 g. ? 1 serving corn x 15 g = 15 g. ? 1 serving milk x 15 g = 15 g. ? 1 serving strawberries x 15 g = 15 g. 4. Add together all of the amounts to find the total grams of carbohydrates eaten: ? 30 g + 15 g + 15 g + 15 g = 75 g of carbohydrates total. Summary  Carbohydrate counting is a method of keeping track of how many carbohydrates you eat.  Eating carbohydrates naturally increases the amount of sugar (glucose) in the blood.  Counting how many carbohydrates you eat helps keep your blood glucose within normal limits, which helps you manage your diabetes.  A diet and nutrition specialist (registered dietitian) can help you make a meal plan and calculate how many carbohydrates you should have at each meal and snack. This information is not intended to replace advice given to you by your health care provider. Make sure you discuss any questions you have with your health care provider. Document Released: 09/20/2005 Document Revised: 03/30/2017 Document Reviewed: 03/03/2016 Elsevier Interactive Patient Education  2019 [employer].

## 2018-12-26 ENCOUNTER — Other Ambulatory Visit: Payer: Self-pay | Admitting: Physician Assistant

## 2019-01-08 ENCOUNTER — Encounter: Payer: Self-pay | Admitting: Sports Medicine

## 2019-01-08 ENCOUNTER — Other Ambulatory Visit: Payer: Self-pay

## 2019-01-08 ENCOUNTER — Ambulatory Visit (INDEPENDENT_AMBULATORY_CARE_PROVIDER_SITE_OTHER): Payer: Medicare HMO | Admitting: Sports Medicine

## 2019-01-08 DIAGNOSIS — L244 Irritant contact dermatitis due to drugs in contact with skin: Secondary | ICD-10-CM

## 2019-01-08 DIAGNOSIS — L259 Unspecified contact dermatitis, unspecified cause: Secondary | ICD-10-CM | POA: Insufficient documentation

## 2019-01-08 MED ORDER — TRIAMCINOLONE ACETONIDE 0.05 % EX OINT: 1.0000 "application " | TOPICAL_OINTMENT | Freq: Two times a day (BID) | CUTANEOUS | 3 refills | Status: DC

## 2019-01-08 NOTE — Progress Notes (Signed)
Subjective:    CC: Skin rash  HPI: This is a pleasant 68 year old female, she has been using a new skin smoothing serum with retinoids.  Unfortunately over the past week or so she is developed a rash on both cheeks, highly pruritic.  She is somewhat sensitive to the sun as well.  She has since stopped the topical steroid.  Symptoms are moderate, persistent, localized without radiation.  No constitutional symptoms, no myalgias or arthralgias.  I reviewed the past medical history, family history, social history, surgical history, and allergies today and no changes were needed.  Please see the problem list section below in epic for further details.  Past Medical History: Past Medical History:  Diagnosis Date  . 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 AKI (acute kidney injury) ([place]) 02/2016  . Allergy   . Arthritis   . Carotid stenosis, asymptomatic, bilateral 05/26/2017   Dopplers 02/12/2016: right - mid-distal 40-59% stenosis; left - 40-59% stenosis  . Cataract   . CKD (chronic kidney disease) stage 3, GFR 30-59 ml/min (HCC) 11/02/2017  . Decreased calculated GFR 06/15/2017  . Gallstones 06/22/2017  . History of echocardiogram 11/07/2017   04/09/2016: EF 57%, mild diastolic dysfunction, trivial MR and TR  . Hypertension   . Hypothyroidism   . 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 Rheumatoid arthritis ([place])   . Rheumatoid arthritis involving multiple sites ([place]) 05/26/2017  . Transaminitis 06/15/2017   Normal abdominal US 06/22/17   Past Surgical History: Past Surgical History:  Procedure Laterality Date  . APPENDECTOMY    . CATARACT EXTRACTION, BILATERAL  2015  . 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  . TOTAL KNEE ARTHROPLASTY Right 12/16/2017   Procedure: RIGHT TOTAL KNEE ARTHROPLASTY;  Surgeon: Dorna Leitz, MD;  Location: [facility];  Service: Orthopedics;  Laterality: Right;  Adductor Block   Social History: Social History   Socioeconomic History  . Marital status: Widowed   Spouse name: Not on file  . Number of children: 0  . Years of education: 16  . Highest education level: Bachelor's degree (e.g., BA, AB, BS)  Occupational History  . Occupation: [employer]    Comment: retired  Scientific laboratory technician  . Financial resource strain: Not hard at all  . Food insecurity:    Worry: Never true    Inability: Never true  . Transportation needs:    Medical: No    Non-medical: No  Tobacco Use  . Smoking status: Former Smoker    Types: Cigarettes    Last attempt to quit: 02/07/1974    Years since quitting: 44.9  . Smokeless tobacco: Never Used  Substance and Sexual Activity  . Alcohol use: Yes    Alcohol/week: 1.0 standard drinks    Types: 1 Glasses of wine per week    Comment: occasionally  . Drug use: No  . Sexual activity: Yes  Lifestyle  . Physical activity:    Days per week: 7 days    Minutes per session: 20 min  . Stress: Not at all  Relationships  . Social connections:    Talks on phone: More than three times a week    Gets together: Twice a week    Attends religious service: More than 4 times per year    Active member of club or organization: No    Attends meetings of clubs or organizations: Never    Relationship status: Widowed  Other Topics Concern  . Not on file  Social History Narrative   Walks  the dog everyday. Runs errands. Caffeine use 2 cups of coffee   Family History: Family History  Problem Relation Age of Onset  . Diabetes Mother   . Lung cancer Father   . Hypertension Father    Allergies: Allergies  Allergen Reactions  . Bee Venom Anaphylaxis  . Oxycodone Other (See Comments)    Adrenal crisis while taking both prednisone and oxycodone   . Prednisone     Adrenal crisis while taking both prednisone and oxycodone   . Verapamil Hives  . Nickel Rash  . Nitrofuran Derivatives Rash   Medications: See med rec.  Review of Systems: No fevers, chills, night sweats, weight loss, chest pain, or shortness of breath.    Objective:    General: Well Developed, well nourished, and in no acute distress.  Neuro: Alert and oriented x3, extra-ocular muscles intact, sensation grossly intact.  HEENT: Normocephalic, atraumatic, pupils equal round reactive to light, neck supple, no masses, no lymphadenopathy, thyroid nonpalpable.  Skin: Warm and dry, there is a maculopapular rash, minimally erythematous over the cheeks and the nose. Cardiac: Regular rate and rhythm, no murmurs rubs or gallops, no lower extremity edema.  Respiratory: Clear to auscultation bilaterally. Not using accessory muscles, speaking in full sentences.  Impression and Recommendations:    Contact dermatitis Used a new topical over-the-counter retinoid. I think this is a combination of contact dermatitis and likely some photosensitization considering distribution of the rash. No associated myalgias, arthralgias. Stop the topical retinoid and adding triamcinolone 0.05% cream twice a day.   ___________________________________________ Gwen Her. Dianah Field, M.D., ABFM., CAQSM. Primary Care and Sports Medicine [facility]  Adjunct Professor of [employer]

## 2019-01-08 NOTE — Assessment & Plan Note (Signed)
Used a new topical over-the-counter retinoid. I think this is a combination of contact dermatitis and likely some photosensitization considering distribution of the rash. No associated myalgias, arthralgias. Stop the topical retinoid and adding triamcinolone 0.05% cream twice a day.

## 2019-01-09 ENCOUNTER — Other Ambulatory Visit: Payer: Self-pay | Admitting: Physician Assistant

## 2019-01-09 ENCOUNTER — Ambulatory Visit: Payer: Medicare HMO | Admitting: Physician Assistant

## 2019-01-11 ENCOUNTER — Other Ambulatory Visit: Payer: Self-pay | Admitting: Sports Medicine

## 2019-01-11 ENCOUNTER — Ambulatory Visit (INDEPENDENT_AMBULATORY_CARE_PROVIDER_SITE_OTHER): Payer: Medicare HMO | Admitting: Physician Assistant

## 2019-01-11 DIAGNOSIS — N183 Chronic kidney disease, stage 3 unspecified: Secondary | ICD-10-CM

## 2019-01-11 DIAGNOSIS — I129 Hypertensive chronic kidney disease with stage 1 through stage 4 chronic kidney disease, or unspecified chronic kidney disease: Secondary | ICD-10-CM

## 2019-01-11 DIAGNOSIS — I10 Essential (primary) hypertension: Secondary | ICD-10-CM

## 2019-01-11 DIAGNOSIS — Z1159 Encounter for screening for other viral diseases: Secondary | ICD-10-CM

## 2019-01-11 DIAGNOSIS — Z862 Personal history of diseases of the blood and blood-forming organs and certain disorders involving the immune mechanism: Secondary | ICD-10-CM

## 2019-01-11 DIAGNOSIS — R739 Hyperglycemia, unspecified: Secondary | ICD-10-CM

## 2019-01-11 DIAGNOSIS — E039 Hypothyroidism, unspecified: Secondary | ICD-10-CM

## 2019-01-11 LAB — CREATINE, SERUM
Creatine, Serum: 1.31 — ABNORMAL HIGH
EGFR (Non-African Amer.): 42 — AB

## 2019-01-11 MED ORDER — QUINAPRIL HCL 40 MG PO TABS: 40.0000 mg | ORAL_TABLET | Freq: Every day | ORAL | 0 refills | Status: DC

## 2019-01-11 MED ORDER — AMLODIPINE BESYLATE 5 MG PO TABS: 5.0000 mg | ORAL_TABLET | Freq: Every day | ORAL | 0 refills | Status: DC

## 2019-01-11 NOTE — Progress Notes (Signed)
Virtual Visit via Telephone Note  I connected with [name] on 01/15/19 at  3:40 PM EDT by telephone and verified that I am speaking with the correct person using two identifiers.   I discussed the limitations, risks, security and privacy concerns of performing an evaluation and management service by telephone and the availability of in person appointments. I also discussed with the patient that there may be a patient responsible charge related to this service. The patient expressed understanding and agreed to proceed.   History of Present Illness: She had labs last month at Rheumatology. Reports her Methotrexate was discontinued due to kidney damage. She was told not to take Methotrexate in the future.  HTN: taking Amlodipine and Quinapril daily. Compliant with medications. Does not check BP's at home. She was seen in the office earlier this week and BP was in range. Denies vision change, headache, chest pain with exertion, orthopnea, lightheadedness, syncope and edema. Risk factors include: postmenopausal, HLD  She reports facial rash that she was seen for on 01/08/19 has nearly resolved with Triamcinolone.    Past Medical History:  Diagnosis Date  . 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 AKI (acute kidney injury) ([place]) 02/2016  . Allergy   . Arthritis   . Carotid stenosis, asymptomatic, bilateral 05/26/2017   Dopplers 02/12/2016: right - mid-distal 40-59% stenosis; left - 40-59% stenosis  . Cataract   . CKD (chronic kidney disease) stage 3, GFR 30-59 ml/min (HCC) 11/02/2017  . Decreased calculated GFR 06/15/2017  . Gallstones 06/22/2017  . History of echocardiogram 11/07/2017   04/09/2016: EF 23%, mild diastolic dysfunction, trivial MR and TR  . Hypertension   . Hypothyroidism   . 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 Rheumatoid arthritis ([place])   . Rheumatoid arthritis involving multiple sites ([place]) 05/26/2017  . Transaminitis 06/15/2017   Normal abdominal US 06/22/17    Past Surgical History:  Procedure Laterality Date  . APPENDECTOMY    . CATARACT EXTRACTION, BILATERAL  2015  . 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  . TOTAL KNEE ARTHROPLASTY Right 12/16/2017   Procedure: RIGHT TOTAL KNEE ARTHROPLASTY;  Surgeon: Dorna Leitz, MD;  Location: [facility];  Service: Orthopedics;  Laterality: Right;  Adductor Block   Social History   Tobacco Use  . Smoking status: Former Smoker    Types: Cigarettes    Last attempt to quit: 02/07/1974    Years since quitting: 44.9  . Smokeless tobacco: Never Used  Substance Use Topics  . Alcohol use: Yes    Alcohol/week: 1.0 standard drinks    Types: 1 Glasses of wine per week    Comment: occasionally   family history includes Diabetes in her mother; Hypertension in her father; Lung cancer in her father.    ROS: negative except as noted in the HPI  Medications: Current Outpatient Medications  Medication Sig Dispense Refill  . acetaminophen (TYLENOL) 650 MG CR tablet Take 2 tablets (1,300 mg total) by mouth 2 (two) times daily as needed for pain.    Marland Kitchen amLODipine (NORVASC) 5 MG tablet Take 1 tablet (5 mg total) by mouth daily. 90 tablet 0  . aspirin EC 81 MG tablet Take 81 mg by mouth daily.    Marland Kitchen atorvastatin (LIPITOR) 40 MG tablet TAKE 1 TABLET BY MOUTH EVERYDAY AT BEDTIME 90 tablet 1  . Calcium Carb-Cholecalciferol (CALCIUM 600 + D PO) Take 2 tablets by mouth daily.    . cetirizine (ZYRTEC) 10 MG tablet Take 1 tablet (10 mg total) by  mouth daily as needed for allergies.    . Cholecalciferol (VITAMIN D) 2000 units tablet Take 2,000 Units by mouth daily.    . Diclofenac Sodium 2 % SOLN Place 2 sprays onto the skin 2 (two) times daily. 1 Bottle 11  . docusate sodium (COLACE) 100 MG capsule Take 100 mg by mouth 2 (two) times daily.    Marland Kitchen EPINEPHrine (EPIPEN 2-PAK) 0.3 mg/0.3 mL IJ SOAJ injection Inject 0.3 mLs (0.3 mg total) into the muscle once as needed. (Patient taking differently: Inject 0.3 mg into the muscle once as  needed (bee stings). ) 2 Device 1  . ferrous sulfate 325 (65 FE) MG tablet Take 325 mg by mouth 2 (two) times daily.    . fluticasone (FLONASE) 50 MCG/ACT nasal spray Place 1 spray into both nostrils daily as needed for allergies.    Marland Kitchen gabapentin (NEURONTIN) 300 MG capsule TAKE 1 CAPSULE BY MOUTH EVERYDAY AT BEDTIME 30 capsule 0  . hydroxychloroquine (PLAQUENIL) 200 MG tablet Take 200 mg by mouth 2 (two) times daily.     Marland Kitchen leflunomide (ARAVA) 20 MG tablet     . leucovorin (WELLCOVORIN) 5 MG tablet Take 5 mg by mouth every Monday, Tuesday, Wednesday, Thursday, and Friday. Except for Saturday/Sunday    . levothyroxine (SYNTHROID, LEVOTHROID) 50 MCG tablet TAKE 1 TABLET (50 MCG TOTAL) BY MOUTH DAILY BEFORE BREAKFAST. 90 tablet 0  . Olopatadine HCl 0.2 % SOLN Place 1 drop into both eyes daily as needed (allergies).    . ondansetron (ZOFRAN) 4 MG tablet     . quinapril (ACCUPRIL) 40 MG tablet Take 1 tablet (40 mg total) by mouth daily. 90 tablet 0  . traZODone (DESYREL) 100 MG tablet TAKE 1/2 TO 1 TABLET BY MOUTH AT BEDTIME AS NEEDED FOR SLEEP 30 tablet 0   No current facility-administered medications for this visit.    Allergies  Allergen Reactions  . Bee Venom Anaphylaxis  . Methotrexate Derivatives Other (See Comments)    Acute kidney injury  . Oxycodone Other (See Comments)    Adrenal crisis while taking both prednisone and oxycodone   . Prednisone     Adrenal crisis while taking both prednisone and oxycodone   . Verapamil Hives  . Nickel Rash  . Nitrofuran Derivatives Rash       Objective:  There were no vitals taken for this visit. Pulm: Normal work of breathing, normal phonation, speaking in full sentences Neuro: alert and oriented x 3 Psych:  cooperative, good eye contact, euthymic mood, affect mood-congruent, speech is articulate, normal volume and rate; thought processes clear and goal-directed, normal judgment, good insight  BP Readings from Last 3 Encounters:  01/08/19  135/81  12/19/18 120/64  07/05/18 111/64   Lab Results  Component Value Date   CREATININE 1.28 (H) 02/22/2018   BUN 21 02/22/2018   NA 139 02/22/2018   K 4.2 02/22/2018   CL 104 02/22/2018   CO2 26 02/22/2018   The 10-year ASCVD risk score Mikey Bussing DC Jr., et al., 2013) is: 10.5%   Values used to calculate the score:     Age: 60 years     Sex: Female     Is Non-Hispanic African American: No     Diabetic: No     Tobacco smoker: No     Systolic Blood Pressure: 102 mmHg     Is BP treated: Yes     HDL Cholesterol: 78 mg/dL     Total Cholesterol: 150 mg/dL   No  results found for this or any previous visit (from the past 11 hour(s)). No results found.    Assessment and Plan: 68 y.o. female with   .Diagnoses and all orders for this visit:  Benign hypertension with CKD (chronic kidney disease) stage III (HCC) -     amLODipine (NORVASC) 5 MG tablet; Take 1 tablet (5 mg total) by mouth daily. -     quinapril (ACCUPRIL) 40 MG tablet; Take 1 tablet (40 mg total) by mouth daily.  CKD (chronic kidney disease) stage 3, GFR 30-59 ml/min (HCC) -     quinapril (ACCUPRIL) 40 MG tablet; Take 1 tablet (40 mg total) by mouth daily.  Hyperglycemia, unspecified -     Hemoglobin A1c; Future  Encounter for hepatitis C screening test for low risk patient -     Hepatitis C antibody; Future   Patient does not self-monitor and unable to provide home BP reading today She was seen in the office on 01/08/19 and BP was in range Personally reviewed lab results from Rheumatology and abstracted CKD is stable with GFR 42 and Scr 1.31 Glucose was increased at 139, likely non-fasting, will check Hgb A1C at next office visit Keep f/u OV scheduled for 06/22/19  Follow Up Instructions:    I discussed the assessment and treatment plan with the patient. The patient was provided an opportunity to ask questions and all were answered. The patient agreed with the plan and demonstrated an understanding of the  instructions.   The patient was advised to call back or seek an in-person evaluation if the symptoms worsen or if the condition fails to improve as anticipated.  I provided 11-20 minutes of non-face-to-face time during this encounter.   Trixie Dredge, [place]

## 2019-01-11 NOTE — Telephone Encounter (Signed)
To PCP

## 2019-01-11 NOTE — Telephone Encounter (Signed)
Pt advised. She will get labs done and she is scheduled.

## 2019-01-11 NOTE — Telephone Encounter (Signed)
Due for labs and e-visit for hypertension/CKD follow-up 30 day supply of Quinapril sent to pharmacy Lab orders placed Please contact patient to schedule e-visit

## 2019-01-15 ENCOUNTER — Encounter: Payer: Self-pay | Admitting: Physician Assistant

## 2019-01-15 DIAGNOSIS — R739 Hyperglycemia, unspecified: Secondary | ICD-10-CM | POA: Insufficient documentation

## 2019-01-19 ENCOUNTER — Other Ambulatory Visit: Payer: Self-pay | Admitting: Physician Assistant

## 2019-01-23 ENCOUNTER — Other Ambulatory Visit: Payer: Self-pay | Admitting: Physician Assistant

## 2019-01-23 DIAGNOSIS — J309 Allergic rhinitis, unspecified: Secondary | ICD-10-CM

## 2019-01-25 DIAGNOSIS — M069 Rheumatoid arthritis, unspecified: Secondary | ICD-10-CM | POA: Diagnosis not present

## 2019-01-31 ENCOUNTER — Other Ambulatory Visit: Payer: Self-pay | Admitting: Physician Assistant

## 2019-02-19 DIAGNOSIS — R5383 Other fatigue: Secondary | ICD-10-CM | POA: Diagnosis not present

## 2019-02-19 DIAGNOSIS — M353 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: Secondary | ICD-10-CM | POA: Diagnosis not present

## 2019-02-19 DIAGNOSIS — M25472 Effusion, left ankle: Secondary | ICD-10-CM | POA: Diagnosis not present

## 2019-02-19 DIAGNOSIS — Z7189 Other specified counseling: Secondary | ICD-10-CM | POA: Diagnosis not present

## 2019-02-19 DIAGNOSIS — M069 Rheumatoid arthritis, unspecified: Secondary | ICD-10-CM | POA: Diagnosis not present

## 2019-02-19 DIAGNOSIS — M150 Primary generalized (osteo)arthritis: Secondary | ICD-10-CM | POA: Diagnosis not present

## 2019-02-21 DIAGNOSIS — M069 Rheumatoid arthritis, unspecified: Secondary | ICD-10-CM | POA: Diagnosis not present

## 2019-03-18 ENCOUNTER — Other Ambulatory Visit: Payer: Self-pay | Admitting: Physician Assistant

## 2019-04-09 IMAGING — CR DG CHEST 2V
2 series · 2 of 2 positions shown · non-contrast
Comparison: None.

CLINICAL DATA: Preop examination.

EXAM:
CHEST - 2 VIEW

[w chest pa]
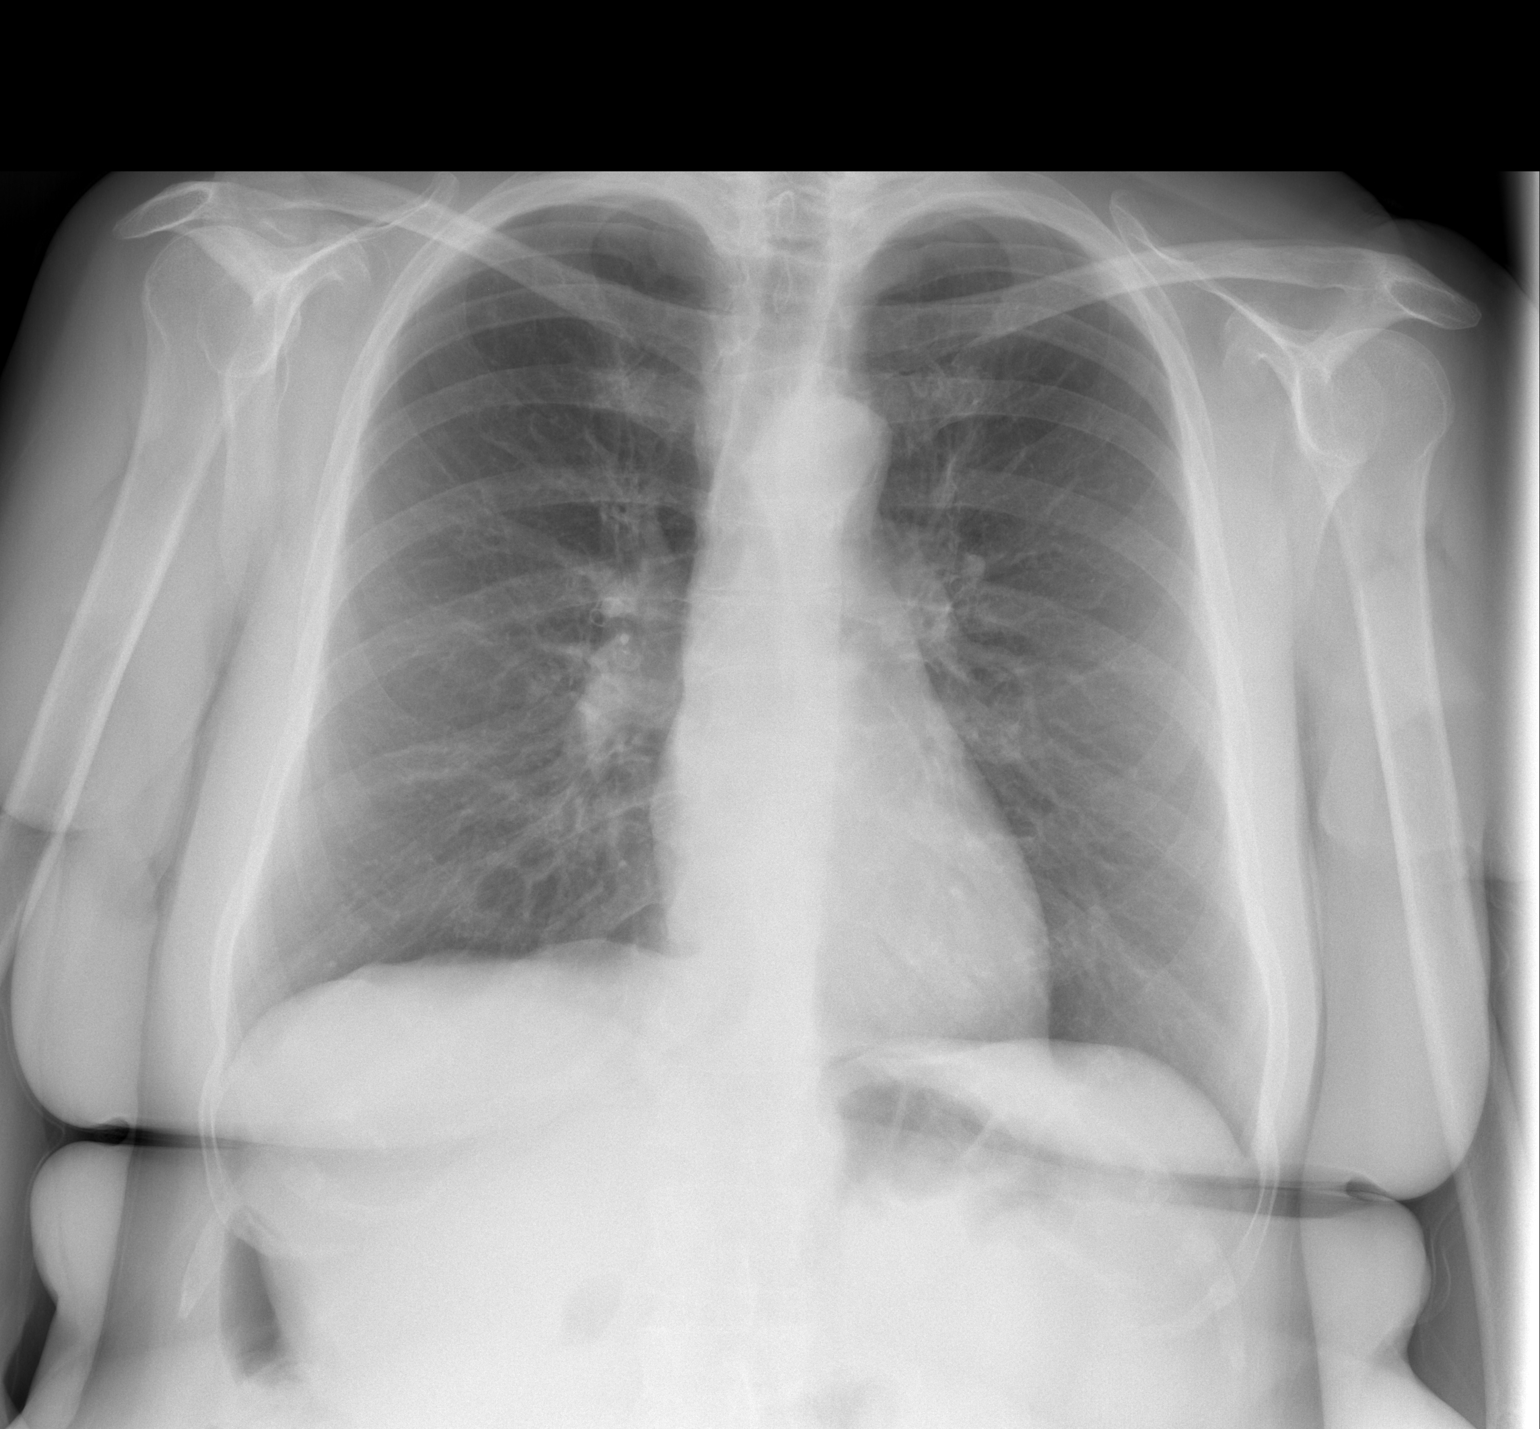

[w chest lat]
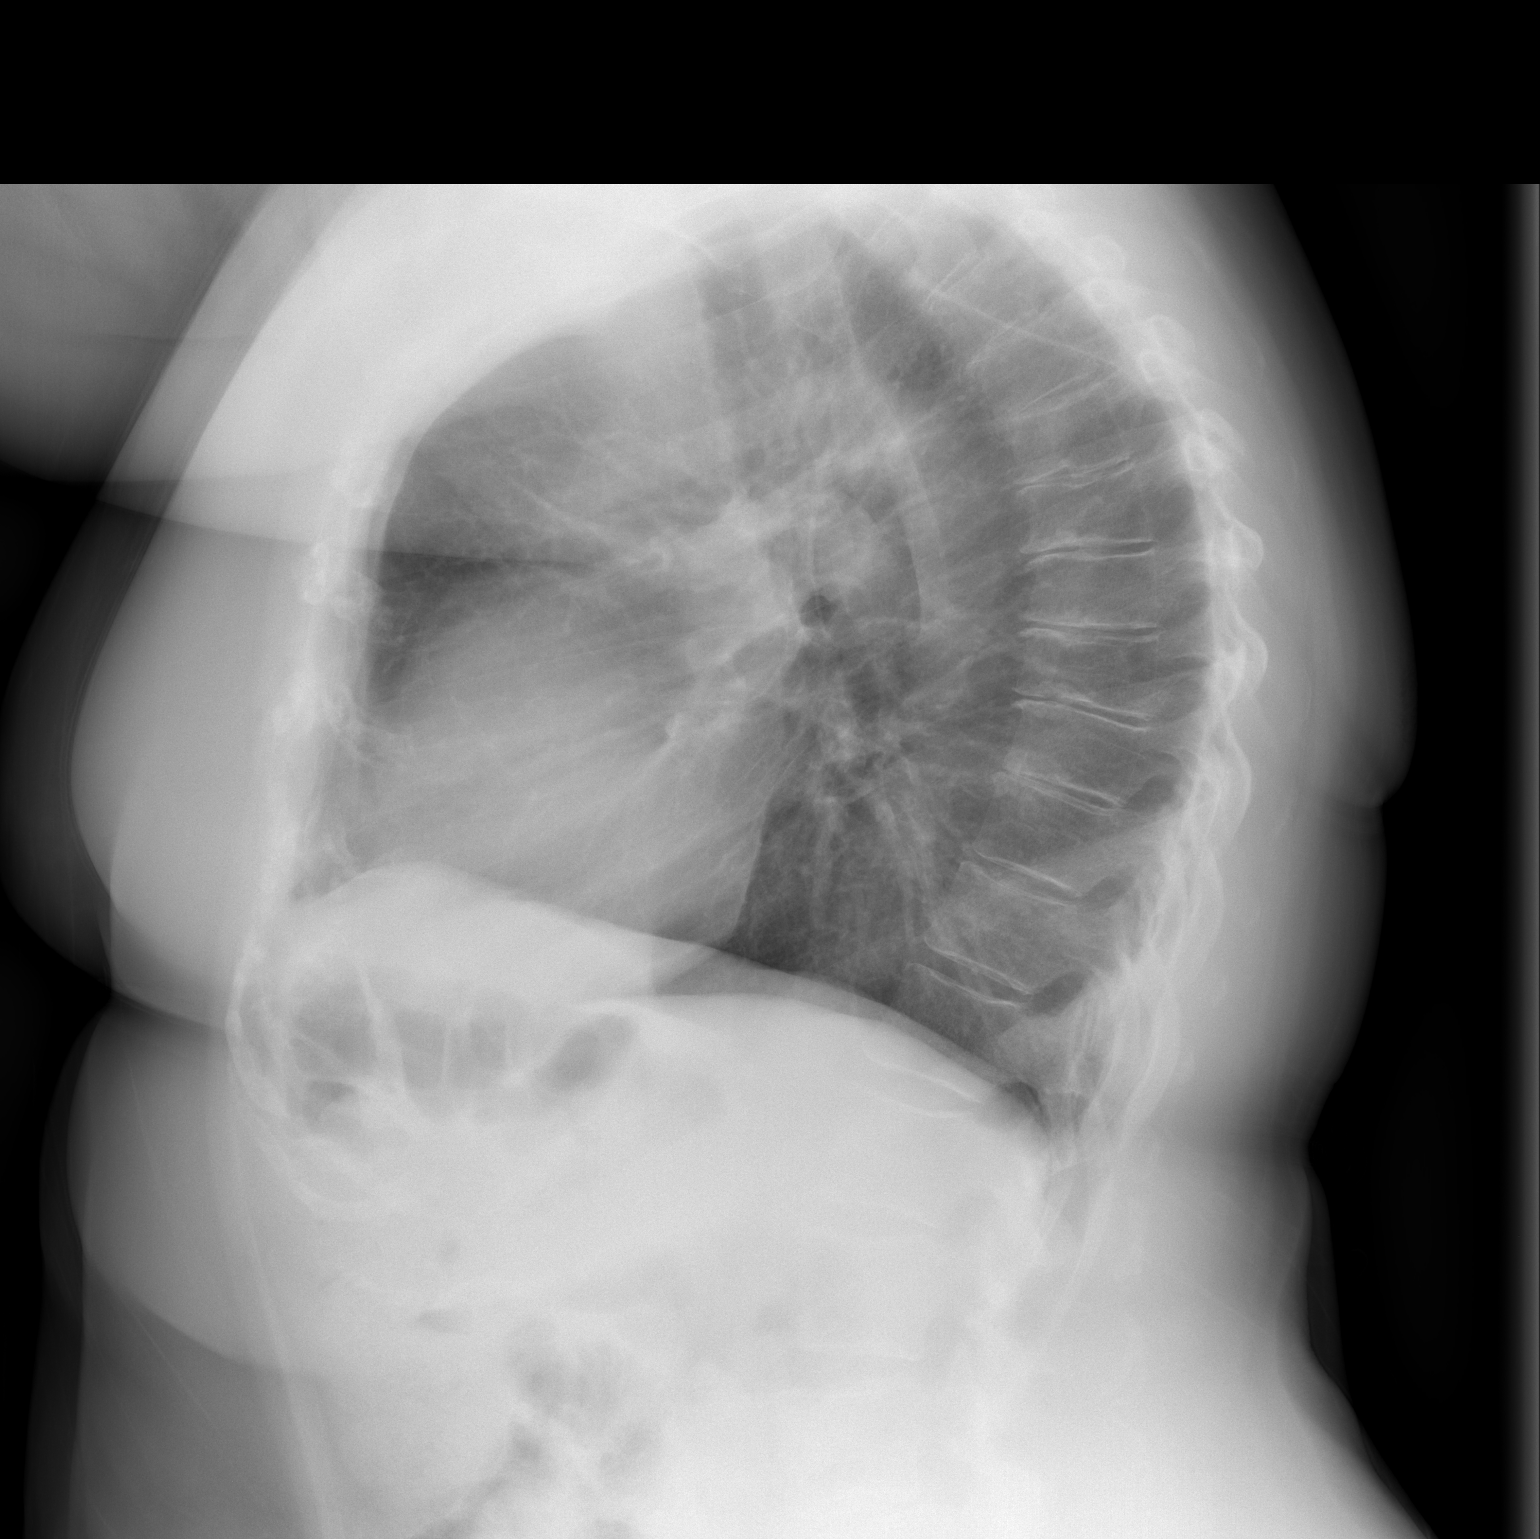

[2 of 2 positions shown; findings below may reference images not displayed]

FINDINGS: The heart size and mediastinal contours are within normal limits.
Both lungs are clear. The visualized skeletal structures are
unremarkable.
IMPRESSION: No active cardiopulmonary disease.

## 2019-04-12 ENCOUNTER — Other Ambulatory Visit: Payer: Self-pay | Admitting: Physician Assistant

## 2019-04-23 ENCOUNTER — Encounter: Payer: Self-pay | Admitting: Osteopathic Medicine

## 2019-04-23 DIAGNOSIS — M069 Rheumatoid arthritis, unspecified: Secondary | ICD-10-CM | POA: Diagnosis not present

## 2019-04-23 DIAGNOSIS — M150 Primary generalized (osteo)arthritis: Secondary | ICD-10-CM | POA: Diagnosis not present

## 2019-04-23 DIAGNOSIS — R5383 Other fatigue: Secondary | ICD-10-CM | POA: Diagnosis not present

## 2019-04-23 DIAGNOSIS — M25472 Effusion, left ankle: Secondary | ICD-10-CM | POA: Diagnosis not present

## 2019-04-23 DIAGNOSIS — Z6833 Body mass index (BMI) 33.0-33.9, adult: Secondary | ICD-10-CM | POA: Diagnosis not present

## 2019-04-23 DIAGNOSIS — Z7189 Other specified counseling: Secondary | ICD-10-CM | POA: Diagnosis not present

## 2019-04-23 DIAGNOSIS — E669 Obesity, unspecified: Secondary | ICD-10-CM | POA: Diagnosis not present

## 2019-04-23 DIAGNOSIS — M353 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: Secondary | ICD-10-CM | POA: Diagnosis not present

## 2019-04-27 ENCOUNTER — Other Ambulatory Visit: Payer: Self-pay | Admitting: Physician Assistant

## 2019-05-06 ENCOUNTER — Other Ambulatory Visit: Payer: Self-pay

## 2019-05-06 ENCOUNTER — Emergency Department
Admission: EM | Admit: 2019-05-06 | Discharge: 2019-05-06 | Disposition: A | Discharge status: Home / Self Care | Payer: Medicare HMO | Source: Home / Self Care | Attending: Family Medicine | Admitting: Family Medicine

## 2019-05-06 DIAGNOSIS — B029 Zoster without complications: Secondary | ICD-10-CM

## 2019-05-06 MED ORDER — VALACYCLOVIR HCL 1 G PO TABS: 1000.0000 mg | ORAL_TABLET | Freq: Three times a day (TID) | ORAL | 0 refills | Status: DC

## 2019-05-06 NOTE — Discharge Instructions (Addendum)
May continue to apply triamcinolone cream for itching and discomfort.  May apply calamine lotion for weeping from rash. If rash begins to affect tip of nose, or increasing eye symptoms, recommend evaluation by opththalmologist.

## 2019-05-06 NOTE — ED Triage Notes (Signed)
Pt c/o rash and scalp itching along with fatigue and upset stomach that started late Friday. Pt recently started a new medication which replaced her methotrexate for her RA which she suspects may be the cause. (New med Leflunomide 20mg ) Denies SOB.

## 2019-05-06 NOTE — ED Provider Notes (Signed)
[name]    CSN: [id number] Arrival date & time: 05/06/19  1357     History   Chief Complaint Chief Complaint  Patient presents with  . Rash  . Fatigue    HPI Melody Moore is a 68 y.o. female.   Patient developed fatigue and mild nausea two days ago.  Yesterday she awoke with a vesicular rash on her left forehead with a "pins and needles" sensation.  The rash is near her left upper eyelid, but does not involve her left eye, and there is no rash on her nose. She has been applying triamcinolone 0.5% cream.  The history is provided by the patient.  Rash Location: left forehead. Quality: blistering, painful, redness and weeping   Quality: not itchy   Pain details:    Quality:  Tingling   Severity:  Mild   Onset quality:  Sudden   Duration:  1 day   Timing:  Constant   Progression:  Worsening Severity:  Moderate Onset quality:  Sudden Duration:  1 day Timing:  Constant Progression:  Worsening Chronicity:  New Context: medications   Context: not animal contact, not chemical exposure, not exposure to similar rash, not food, not hot tub use, not insect bite/sting, not new detergent/soap, not nuts, not plant contact and not pollen   Relieved by:  Nothing Worsened by:  Nothing Ineffective treatments:  None tried Associated symptoms: nausea   Associated symptoms: no abdominal pain, no fatigue, no fever, no headaches, no induration, no joint pain, no myalgias, no periorbital edema, no shortness of breath, no sore throat, no throat swelling and no URI     Past Medical History:  Diagnosis Date  . 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 AKI (acute kidney injury) ([place]) 02/2016  . Allergy   . Arthritis   . Carotid stenosis, asymptomatic, bilateral 05/26/2017   Dopplers 02/12/2016: right - mid-distal 40-59% stenosis; left - 40-59% stenosis  . Cataract   . CKD (chronic kidney disease) stage 3, GFR 30-59 ml/min (HCC) 11/02/2017  . Decreased calculated GFR 06/15/2017   . Gallstones 06/22/2017  . History of echocardiogram 11/07/2017   04/09/2016: EF 66%, mild diastolic dysfunction, trivial MR and TR  . Hypertension   . Hypothyroidism   . 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 Rheumatoid arthritis ([place])   . Rheumatoid arthritis involving multiple sites ([place]) 05/26/2017  . Transaminitis 06/15/2017   Normal abdominal US 06/22/17    Patient Active Problem List   Diagnosis Date Noted  . Hyperglycemia, unspecified 01/15/2019  . Contact dermatitis 01/08/2019  . Encounter for long-term (current) use of medications 05/24/2018  . Left ankle injury, initial encounter 05/24/2018  . Impacted cerumen of left ear 05/24/2018  . Elevated blood pressure reading in office with diagnosis of hypertension 05/24/2018  . Normocytic anemia 02/22/2018  . Encounter for monitoring statin therapy 02/22/2018  . Dyslipidemia 02/22/2018  . Blister of right heel 01/04/2018  . Primary osteoarthritis of right knee 12/16/2017  . Benign hypertension with CKD (chronic kidney disease) stage III ([place]) 12/04/2017  . Primary osteoarthritis involving multiple joints 12/04/2017  . History of echocardiogram 11/07/2017  . CKD (chronic kidney disease) stage 3, GFR 30-59 ml/min (HCC) 11/02/2017  . 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 Gallstones 06/22/2017  . Elevated serum creatinine 06/15/2017  . Elevated alkaline phosphatase level 06/15/2017  . Transaminitis 06/15/2017  . Acquired hypothyroidism 05/26/2017  . Essential hypertension 05/26/2017  . H/O total knee replacement, left 05/26/2017  . History  of adrenal insufficiency 05/26/2017  . Rheumatoid arthritis involving multiple sites ([place]) 05/26/2017  . Osteoarthritis of right knee 05/26/2017  . Allergic rhinitis 05/26/2017  . Right carotid bruit 05/26/2017  . Carotid stenosis, asymptomatic, bilateral 05/26/2017  . Allergy to honey bee venom 05/26/2017    Past Surgical History:  Procedure Laterality Date  .  APPENDECTOMY    . CATARACT EXTRACTION, BILATERAL  2015  . 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  . TOTAL KNEE ARTHROPLASTY Right 12/16/2017   Procedure: RIGHT TOTAL KNEE ARTHROPLASTY;  Surgeon: Dorna Leitz, MD;  Location: [facility];  Service: Orthopedics;  Laterality: Right;  Adductor Block    OB History   No obstetric history on file.      Home Medications    Prior to Admission medications   Medication Sig Start Date End Date Taking? Authorizing Provider  acetaminophen (TYLENOL) 650 MG CR tablet Take 2 tablets (1,300 mg total) by mouth 2 (two) times daily as needed for pain. 02/22/18   Trixie Dredge, PA-C  amLODipine (NORVASC) 5 MG tablet Take 1 tablet (5 mg total) by mouth daily. 01/11/19   Trixie Dredge, PA-C  aspirin EC 81 MG tablet Take 81 mg by mouth daily.    [provider]  atorvastatin (LIPITOR) 40 MG tablet TAKE 1 TABLET BY MOUTH EVERYDAY AT BEDTIME 03/28/18   Trixie Dredge, PA-C  Calcium Carb-Cholecalciferol (CALCIUM 600 + D PO) Take 2 tablets by mouth daily.    [provider]  cetirizine (ZYRTEC) 10 MG tablet Take 1 tablet (10 mg total) by mouth daily as needed for allergies. 02/22/18   Trixie Dredge, PA-C  Cholecalciferol (VITAMIN D) 2000 units tablet Take 2,000 Units by mouth daily.    [provider]  Diclofenac Sodium 2 % SOLN Place 2 sprays onto the skin 2 (two) times daily. 06/07/18   Silverio Decamp, MD  docusate sodium (COLACE) 100 MG capsule Take 100 mg by mouth 2 (two) times daily.    [provider]  EPINEPHrine (EPIPEN 2-PAK) 0.3 mg/0.3 mL IJ SOAJ injection Inject 0.3 mLs (0.3 mg total) into the muscle once as needed. Patient taking differently: Inject 0.3 mg into the muscle once as needed (bee stings).  05/26/17   Trixie Dredge, PA-C  ferrous sulfate 325 (65 FE) MG tablet Take 325 mg by mouth 2 (two) times daily.    [provider]  fluticasone  (FLONASE) 50 MCG/ACT nasal spray USE 1 SPRAY INTO EACH NOSTRIL EVERY DAY 01/23/19   Trixie Dredge, PA-C  gabapentin (NEURONTIN) 300 MG capsule TAKE 1 CAPSULE BY MOUTH EVERYDAY AT BEDTIME 01/31/19   Trixie Dredge, PA-C  hydroxychloroquine (PLAQUENIL) 200 MG tablet Take 200 mg by mouth 2 (two) times daily.     [provider]  leflunomide (ARAVA) 20 MG tablet  12/18/18   [provider]  leucovorin (WELLCOVORIN) 5 MG tablet Take 5 mg by mouth every Monday, Tuesday, Wednesday, Thursday, and Friday. Except for [facility]    [provider]  levothyroxine (SYNTHROID) 50 MCG tablet Take 1 tablet (50 mcg total) by mouth daily before breakfast. Must get labs before any further refills 04/27/19   Trixie Dredge, PA-C  Olopatadine HCl 0.2 % SOLN Place 1 drop into both eyes daily as needed (allergies). 02/22/18   Trixie Dredge, PA-C  ondansetron [facility]) 4 MG tablet  12/29/17   [provider]  quinapril (ACCUPRIL) 40 MG tablet Take 1 tablet (40 mg total) by mouth  daily. 01/11/19   Trixie Dredge, PA-C  traZODone (DESYREL) 100 MG tablet TAKE 1/2 TO 1 TABLET BY MOUTH AT BEDTIME AS NEEDED FOR SLEEP 01/19/19   Trixie Dredge, PA-C  valACYclovir (VALTREX) 1000 MG tablet Take 1 tablet (1,000 mg total) by mouth 3 (three) times daily. 05/06/19   Kandra Nicolas, MD    Family History Family History  Problem Relation Age of Onset  . Diabetes Mother   . Lung cancer Father   . Hypertension Father     Social History Social History   Tobacco Use  . Smoking status: Former Smoker    Types: Cigarettes    Quit date: 02/07/1974    Years since quitting: 45.2  . Smokeless tobacco: Never Used  Substance Use Topics  . Alcohol use: Yes    Alcohol/week: 1.0 standard drinks    Types: 1 Glasses of wine per week    Comment: occasionally  . Drug use: No     Allergies   Bee venom, Methotrexate derivatives,  Oxycodone, Prednisone, Verapamil, Nickel, and Nitrofuran derivatives   Review of Systems Review of Systems  Constitutional: Negative for fatigue and fever.  HENT: Negative for sore throat.   Respiratory: Negative for shortness of breath.   Gastrointestinal: Positive for nausea. Negative for abdominal pain.  Musculoskeletal: Negative for arthralgias and myalgias.  Skin: Positive for rash.  Neurological: Negative for headaches.  All other systems reviewed and are negative.    Physical Exam Triage Vital Signs ED Triage Vitals  Enc Vitals Group     BP 05/06/19 1428 136/85     Pulse Rate 05/06/19 1428 99     Resp 05/06/19 1428 18     Temp 05/06/19 1428 98.1 F (36.7 C)     Temp Source 05/06/19 1428 Oral     SpO2 05/06/19 1428 94 %     Weight 05/06/19 1429 201 lb (91.2 kg)     Height --      Head Circumference --      Peak Flow --      Pain Score 05/06/19 1429 0     Pain Loc --      Pain Edu? --      Excl. in [place]? --    No data found.  Updated Vital Signs BP 136/85 (BP Location: Right Arm)   Pulse 99   Temp 98.1 F (36.7 C) (Oral)   Resp 18   Wt 91.2 kg   SpO2 94%   BMI 35.61 kg/m   Visual Acuity Right Eye Distance:   Left Eye Distance:   Bilateral Distance:    Right Eye Near:   Left Eye Near:    Bilateral Near:     Physical Exam Vitals signs and nursing note reviewed.  Constitutional:      General: She is not in acute distress. HENT:     Head: Normocephalic.      Comments: Left forehead has a vesicular erythematous rash extending to her left anterior scalp.  Vesicles contain clear fluid. Mild erythema left upper eyelid without swelling.  No rash on nose.    Right Ear: External ear normal.     Left Ear: External ear normal.     Nose: Nose normal.     Mouth/Throat:     Pharynx: Oropharynx is clear.  Eyes:     Extraocular Movements: Extraocular movements intact.     Conjunctiva/sclera: Conjunctivae normal.     Pupils: Pupils are equal, round, and  reactive to light.  Cardiovascular:     Rate and Rhythm: Normal rate.  Pulmonary:     Effort: Pulmonary effort is normal.  Lymphadenopathy:     Cervical: No cervical adenopathy.  Skin:    General: Skin is warm and dry.  Neurological:     Mental Status: She is alert.      UC Treatments / Results  Labs (all labs ordered are listed, but only abnormal results are displayed) Labs Reviewed - No data to display  EKG   Radiology No results found.  Procedures Procedures (including critical care time)  Medications Ordered in UC Medications - No data to display  Initial Impression / Assessment and Plan / UC Course  I have reviewed the triage vital signs and the nursing notes.  Pertinent labs & imaging results that were available during my care of the patient were reviewed by me and considered in my medical decision making (see chart for details).    Begin Valtrex. Followup with Family Doctor if not improved in about 8 days.   Final Clinical Impressions(s) / UC Diagnoses   Final diagnoses:  Herpes zoster without complication     Discharge Instructions     May continue to apply triamcinolone cream for itching and discomfort.  May apply calamine lotion for weeping from rash. If rash begins to affect tip of nose, or increasing eye symptoms, recommend evaluation by opththalmologist.     ED Prescriptions    Medication Sig Dispense Auth. Provider   valACYclovir (VALTREX) 1000 MG tablet Take 1 tablet (1,000 mg total) by mouth 3 (three) times daily. 21 tablet Kandra Nicolas, MD        Kandra Nicolas, MD 05/07/19 [phone]

## 2019-05-07 ENCOUNTER — Other Ambulatory Visit: Payer: Self-pay

## 2019-05-07 ENCOUNTER — Ambulatory Visit (INDEPENDENT_AMBULATORY_CARE_PROVIDER_SITE_OTHER): Payer: Medicare HMO | Admitting: Physician Assistant

## 2019-05-07 ENCOUNTER — Encounter: Payer: Self-pay | Admitting: Physician Assistant

## 2019-05-07 DIAGNOSIS — B0230 Zoster ocular disease, unspecified: Secondary | ICD-10-CM | POA: Diagnosis not present

## 2019-05-07 NOTE — Progress Notes (Signed)
Virtual Visit via Telephone Note  I connected with Melody Moore on 05/07/19 at  1:00 PM EDT by telephone and verified that I am speaking with the correct person using two identifiers.   I discussed the limitations, risks, security and privacy concerns of performing an evaluation and management service by telephone and the availability of in person appointments. I also discussed with the patient that there may be a patient responsible charge related to this service. The patient expressed understanding and agreed to proceed.   History of Present Illness: HPI:                                                                Melody Moore is a 68 y.o. female   CC: Shingles  Pleasant 68 yo F with PMH RA, immunosuppresion, CKD, adrenal crisis, hypothyroidism who was diagnosed with shingles involving her left forehead and scalp in urgent care yesterday. She was started on Valtrex 1000 mg tid and has been taking this as prescribed. Reports today she developed a "film over her left eye" with "cloudy" vision and left eyelid swelling. She states rash has spread to the left temple adjacent to her eye with purulent material coming out.    Past Medical History:  Diagnosis Date  . 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 AKI (acute kidney injury) ([place]) 02/2016  . Allergy   . Arthritis   . Carotid stenosis, asymptomatic, bilateral 05/26/2017   Dopplers 02/12/2016: right - mid-distal 40-59% stenosis; left - 40-59% stenosis  . Cataract   . CKD (chronic kidney disease) stage 3, GFR 30-59 ml/min (HCC) 11/02/2017  . Decreased calculated GFR 06/15/2017  . Gallstones 06/22/2017  . History of echocardiogram 11/07/2017   04/09/2016: EF 55%, mild diastolic dysfunction, trivial MR and TR  . Hypertension   . Hypothyroidism   . 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 Rheumatoid arthritis ([place])   . Rheumatoid arthritis involving multiple sites ([place]) 05/26/2017  . Transaminitis 06/15/2017   Normal abdominal US  06/22/17   Past Surgical History:  Procedure Laterality Date  . APPENDECTOMY    . CATARACT EXTRACTION, BILATERAL  2015  . 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  . TOTAL KNEE ARTHROPLASTY Right 12/16/2017   Procedure: RIGHT TOTAL KNEE ARTHROPLASTY;  Surgeon: Dorna Leitz, MD;  Location: [facility];  Service: Orthopedics;  Laterality: Right;  Adductor Block   Social History   Tobacco Use  . Smoking status: Former Smoker    Types: Cigarettes    Quit date: 02/07/1974    Years since quitting: 45.2  . Smokeless tobacco: Never Used  Substance Use Topics  . Alcohol use: Yes    Alcohol/week: 1.0 standard drinks    Types: 1 Glasses of wine per week    Comment: occasionally   family history includes Diabetes in her mother; Hypertension in her father; Lung cancer in her father.    ROS: negative except as noted in the HPI  Medications: Current Outpatient Medications  Medication Sig Dispense Refill  . acetaminophen (TYLENOL) 650 MG CR tablet Take 2 tablets (1,300 mg total) by mouth 2 (two) times daily as needed for pain.    Marland Kitchen amLODipine (NORVASC) 5 MG tablet Take 1 tablet (5 mg total) by mouth daily. 90 tablet 0  . aspirin EC  81 MG tablet Take 81 mg by mouth daily.    Marland Kitchen atorvastatin (LIPITOR) 40 MG tablet TAKE 1 TABLET BY MOUTH EVERYDAY AT BEDTIME 90 tablet 1  . Calcium Carb-Cholecalciferol (CALCIUM 600 + D PO) Take 2 tablets by mouth daily.    . cetirizine (ZYRTEC) 10 MG tablet Take 1 tablet (10 mg total) by mouth daily as needed for allergies.    . Cholecalciferol (VITAMIN D) 2000 units tablet Take 2,000 Units by mouth daily.    . Diclofenac Sodium 2 % SOLN Place 2 sprays onto the skin 2 (two) times daily. 1 Bottle 11  . docusate sodium (COLACE) 100 MG capsule Take 100 mg by mouth 2 (two) times daily.    Marland Kitchen EPINEPHrine (EPIPEN 2-PAK) 0.3 mg/0.3 mL IJ SOAJ injection Inject 0.3 mLs (0.3 mg total) into the muscle once as needed. (Patient taking differently: Inject 0.3 mg into the muscle once as  needed (bee stings). ) 2 Device 1  . ferrous sulfate 325 (65 FE) MG tablet Take 325 mg by mouth 2 (two) times daily.    . fluticasone (FLONASE) 50 MCG/ACT nasal spray USE 1 SPRAY INTO EACH NOSTRIL EVERY DAY 16 g 1  . gabapentin (NEURONTIN) 300 MG capsule TAKE 1 CAPSULE BY MOUTH EVERYDAY AT BEDTIME 90 capsule 1  . hydroxychloroquine (PLAQUENIL) 200 MG tablet Take 200 mg by mouth 2 (two) times daily.     Marland Kitchen leflunomide (ARAVA) 20 MG tablet     . leucovorin (WELLCOVORIN) 5 MG tablet Take 5 mg by mouth every Monday, Tuesday, Wednesday, Thursday, and Friday. Except for Saturday/Sunday    . levothyroxine (SYNTHROID) 50 MCG tablet Take 1 tablet (50 mcg total) by mouth daily before breakfast. Must get labs before any further refills 10 tablet 0  . Olopatadine HCl 0.2 % SOLN Place 1 drop into both eyes daily as needed (allergies).    . ondansetron (ZOFRAN) 4 MG tablet     . quinapril (ACCUPRIL) 40 MG tablet Take 1 tablet (40 mg total) by mouth daily. 90 tablet 0  . traZODone (DESYREL) 100 MG tablet TAKE 1/2 TO 1 TABLET BY MOUTH AT BEDTIME AS NEEDED FOR SLEEP 90 tablet 1  . valACYclovir (VALTREX) 1000 MG tablet Take 1 tablet (1,000 mg total) by mouth 3 (three) times daily. 21 tablet 0   No current facility-administered medications for this visit.    Allergies  Allergen Reactions  . Bee Venom Anaphylaxis  . Methotrexate Derivatives Other (See Comments)    Acute kidney injury  . Oxycodone Other (See Comments)    Adrenal crisis while taking both prednisone and oxycodone   . Prednisone     Adrenal crisis while taking both prednisone and oxycodone   . Verapamil Hives  . Nickel Rash  . Nitrofuran Derivatives Rash       Objective:  There were no vitals taken for this visit. Pulm: Normal work of breathing, normal phonation Neuro: alert and oriented x 3 Psych: cooperative, speech is articulate, normal rate and volume; thought processes clear and goal-directed, normal judgment, good insight    No  results found for this or any previous visit (from the past 72 hour(s)). No results found.    Assessment and Plan: 68 y.o. female with   .Diagnoses and all orders for this visit:  Herpes zoster ophthalmicus of left eye     Called and spoke with [facility] to arrange urgent Opth. Follow-up Patient has appt at 3:30 pm tomorrow Cont Valtrex 1000 mg tid  Follow Up Instructions:    I discussed the assessment and treatment plan with the patient. The patient was provided an opportunity to ask questions and all were answered. The patient agreed with the plan and demonstrated an understanding of the instructions.   The patient was advised to call back or seek an in-person evaluation if the symptoms worsen or if the condition fails to improve as anticipated.  I provided 5-10 minutes of non-face-to-face time during this encounter.   Trixie Dredge, [place]

## 2019-05-08 ENCOUNTER — Telehealth: Payer: Self-pay

## 2019-05-08 DIAGNOSIS — B0239 Other herpes zoster eye disease: Secondary | ICD-10-CM | POA: Diagnosis not present

## 2019-05-08 NOTE — Telephone Encounter (Signed)
Called patient, left message on VM with contact information for any questions.

## 2019-05-13 ENCOUNTER — Other Ambulatory Visit: Payer: Self-pay | Admitting: Physician Assistant

## 2019-05-14 ENCOUNTER — Encounter: Payer: Self-pay | Admitting: Physician Assistant

## 2019-05-14 ENCOUNTER — Other Ambulatory Visit: Payer: Self-pay

## 2019-05-14 ENCOUNTER — Ambulatory Visit (INDEPENDENT_AMBULATORY_CARE_PROVIDER_SITE_OTHER): Payer: Medicare HMO | Admitting: Physician Assistant

## 2019-05-14 VITALS — BP 154/78 | HR 69 | Temp 98.0°F | Wt 197.0 lb

## 2019-05-14 DIAGNOSIS — B0230 Zoster ocular disease, unspecified: Secondary | ICD-10-CM | POA: Diagnosis not present

## 2019-05-14 DIAGNOSIS — R112 Nausea with vomiting, unspecified: Secondary | ICD-10-CM

## 2019-05-14 DIAGNOSIS — E039 Hypothyroidism, unspecified: Secondary | ICD-10-CM | POA: Diagnosis not present

## 2019-05-14 DIAGNOSIS — L03211 Cellulitis of face: Secondary | ICD-10-CM | POA: Diagnosis not present

## 2019-05-14 DIAGNOSIS — N183 Chronic kidney disease, stage 3 unspecified: Secondary | ICD-10-CM

## 2019-05-14 DIAGNOSIS — I10 Essential (primary) hypertension: Secondary | ICD-10-CM

## 2019-05-14 DIAGNOSIS — I6523 Occlusion and stenosis of bilateral carotid arteries: Secondary | ICD-10-CM

## 2019-05-14 DIAGNOSIS — T50905A Adverse effect of unspecified drugs, medicaments and biological substances, initial encounter: Secondary | ICD-10-CM

## 2019-05-14 DIAGNOSIS — R739 Hyperglycemia, unspecified: Secondary | ICD-10-CM

## 2019-05-14 DIAGNOSIS — Z1159 Encounter for screening for other viral diseases: Secondary | ICD-10-CM

## 2019-05-14 DIAGNOSIS — Z862 Personal history of diseases of the blood and blood-forming organs and certain disorders involving the immune mechanism: Secondary | ICD-10-CM | POA: Diagnosis not present

## 2019-05-14 MED ORDER — TRAMADOL HCL 50 MG PO TABS: 50.0000 mg | ORAL_TABLET | Freq: Four times a day (QID) | ORAL | 0 refills | Status: DC | PRN

## 2019-05-14 MED ORDER — ONDANSETRON HCL 4 MG PO TABS
4.0000 mg | ORAL_TABLET | Freq: Once | ORAL | Status: AC
  Administered 2019-05-14: 4 mg via ORAL

## 2019-05-14 MED ORDER — DOXYCYCLINE HYCLATE 100 MG PO TABS: 100.0000 mg | ORAL_TABLET | Freq: Two times a day (BID) | ORAL | 0 refills | Status: DC

## 2019-05-14 MED ORDER — ONDANSETRON 4 MG PO TBDP: 4.0000 mg | ORAL_TABLET | Freq: Three times a day (TID) | ORAL | 0 refills | Status: DC | PRN

## 2019-05-14 MED ORDER — ATORVASTATIN CALCIUM 40 MG PO TABS: ORAL_TABLET | ORAL | 1 refills | Status: DC

## 2019-05-14 NOTE — Patient Instructions (Signed)
Shingles  Shingles, which is also known as herpes zoster, is an infection that causes a painful skin rash and fluid-filled blisters. It is caused by a virus. Shingles only develops in people who:  Have had chickenpox.  Have been given a medicine to protect against chickenpox (have been vaccinated). Shingles is rare in this group. What are the causes? Shingles is caused by varicella-zoster virus (VZV). This is the same virus that causes chickenpox. After a person is exposed to VZV, the virus stays in the body in an inactive (dormant) state. Shingles develops if the virus is reactivated. This can happen many years after the first (initial) exposure to VZV. It is not known what causes this virus to be reactivated. What increases the risk? People who have had chickenpox or received the chickenpox vaccine are at risk for shingles. Shingles infection is more common in people who:  Are older than age 60.  Have a weakened disease-fighting system (immune system), such as people with: ? HIV. ? AIDS. ? Cancer.  Are taking medicines that weaken the immune system, such as transplant medicines.  Are experiencing a lot of stress. What are the signs or symptoms? Early symptoms of this condition include itching, tingling, and pain in an area on your skin. Pain may be described as burning, stabbing, or throbbing. A few days or weeks after early symptoms start, a painful red rash appears. The rash is usually on one side of the body and has a band-like or belt-like pattern. The rash eventually turns into fluid-filled blisters that break open, change into scabs, and dry up in about 2-3 weeks. At any time during the infection, you may also develop:  A fever.  Chills.  A headache.  An upset stomach. How is this diagnosed? This condition is diagnosed with a skin exam. Skin or fluid samples may be taken from the blisters before a diagnosis is made. These samples are examined under a microscope or sent to  a lab for testing. How is this treated? The rash may last for several weeks. There is not a specific cure for this condition. Your health care provider will probably prescribe medicines to help you manage pain, recover more quickly, and avoid long-term problems. Medicines may include:  Antiviral drugs.  Anti-inflammatory drugs.  Pain medicines.  Anti-itching medicines (antihistamines). If the area involved is on your face, you may be referred to a specialist, such as an eye doctor (ophthalmologist) or an ear, nose, and throat (ENT) doctor (otolaryngologist) to help you avoid eye problems, chronic pain, or disability. Follow these instructions at home: Medicines  Take over-the-counter and prescription medicines only as told by your health care provider.  Apply an anti-itch cream or numbing cream to the affected area as told by your health care provider. Relieving itching and discomfort   Apply cold, wet cloths (cold compresses) to the area of the rash or blisters as told by your health care provider.  Cool baths can be soothing. Try adding baking soda or dry oatmeal to the water to reduce itching. Do not bathe in hot water. Blister and rash care  Keep your rash covered with a loose bandage (dressing). Wear loose-fitting clothing to help ease the pain of material rubbing against the rash.  Keep your rash and blisters clean by washing the area with mild soap and cool water as told by your health care provider.  Check your rash every day for signs of infection. Check for: ? More redness, swelling, or pain. ? Fluid   or blood. ? Warmth. ? Pus or a bad smell.  Do not scratch your rash or pick at your blisters. To help avoid scratching: ? Keep your fingernails clean and cut short. ? Wear gloves or mittens while you sleep, if scratching is a problem. General instructions  Rest as told by your health care provider.  Keep all follow-up visits as told by your health care provider. This  is important.  Wash your hands often with soap and water. If soap and water are not available, use hand sanitizer. Doing this lowers your chance of getting a bacterial skin infection.  Before your blisters change into scabs, your shingles infection can cause chickenpox in people who have never had it or have never been vaccinated against it. To prevent this from happening, avoid contact with other people, especially: ? Babies. ? Pregnant women. ? Children who have eczema. ? Elderly people who have transplants. ? People who have chronic illnesses, such as cancer or AIDS. Contact a health care provider if:  Your pain is not relieved with prescribed medicines.  Your pain does not get better after the rash heals.  You have signs of infection in the rash area, such as: ? More redness, swelling, or pain around the rash. ? Fluid or blood coming from the rash. ? The rash area feeling warm to the touch. ? Pus or a bad smell coming from the rash. Get help right away if:  The rash is on your face or nose.  You have facial pain, pain around your eye area, or loss of feeling on one side of your face.  You have difficulty seeing.  You have ear pain or have ringing in your ear.  You have a loss of taste.  Your condition gets worse. Summary  Shingles, which is also known as herpes zoster, is an infection that causes a painful skin rash and fluid-filled blisters.  This condition is diagnosed with a skin exam. Skin or fluid samples may be taken from the blisters and examined before the diagnosis is made.  Keep your rash covered with a loose bandage (dressing). Wear loose-fitting clothing to help ease the pain of material rubbing against the rash.  Before your blisters change into scabs, your shingles infection can cause chickenpox in people who have never had it or have never been vaccinated against it. This information is not intended to replace advice given to you by your health care  provider. Make sure you discuss any questions you have with your health care provider. Document Released: 09/20/2005 Document Revised: 01/12/2019 Document Reviewed: 05/25/2017 Elsevier Patient Education  2020 Elsevier Inc[employer]  

## 2019-05-14 NOTE — Progress Notes (Signed)
HPI:                                                                Melody Moore is a 68 y.o. female who presents to [facility] today for Shingles follow-up  Pleasant 68 yo F with PMH RA, immunosuppresion, CKD, adrenal crisis, hypothyroidism who was diagnosed with shingles involving her left forehead and scalp on 05/06/19. She was started on Valtrex 1000 mg tid and has been taking this as prescribed. Reports today she developed a "film over her left eye" with "cloudy" vision and left eyelid swelling. She states rash has spread to the left temple adjacent to her eye with purulent material coming out.   Interval hx 05/14/19 She was evaluated by Ophth last week and thankfully did not have any lesions on her eye and is using OTC eye lubricant. She has a follow-up appt tomorrow. Denies vision change today She reports rash is exquisitely painful and she has not been taking any pain relievers She finished Valtrex yesterday. She reports she has been having severe nausea and intermittent vomiting, which she attributed to the medication.  She contacted her Rheumatologist and she is not currently taking Plaquenil or Arava until Shingles resolves  Past Medical History:  Diagnosis Date  . 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 AKI (acute kidney injury) ([place]) 02/2016  . Allergy   . Arthritis   . Carotid stenosis, asymptomatic, bilateral 05/26/2017   Dopplers 02/12/2016: right - mid-distal 40-59% stenosis; left - 40-59% stenosis  . Cataract   . CKD (chronic kidney disease) stage 3, GFR 30-59 ml/min (HCC) 11/02/2017  . Decreased calculated GFR 06/15/2017  . Gallstones 06/22/2017  . History of echocardiogram 11/07/2017   04/09/2016: EF 93%, mild diastolic dysfunction, trivial MR and TR  . Hypertension   . Hypothyroidism   . 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 Rheumatoid arthritis ([place])   . Rheumatoid arthritis involving multiple sites ([place])  05/26/2017  . Transaminitis 06/15/2017   Normal abdominal US 06/22/17   Past Surgical History:  Procedure Laterality Date  . APPENDECTOMY    . CATARACT EXTRACTION, BILATERAL  2015  . 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  . TOTAL KNEE ARTHROPLASTY Right 12/16/2017   Procedure: RIGHT TOTAL KNEE ARTHROPLASTY;  Surgeon: Dorna Leitz, MD;  Location: [facility];  Service: Orthopedics;  Laterality: Right;  Adductor Block   Social History   Tobacco Use  . Smoking status: Former Smoker    Types: Cigarettes    Quit date: 02/07/1974    Years since quitting: 45.2  . Smokeless tobacco: Never Used  Substance Use Topics  . Alcohol use: Yes    Alcohol/week: 1.0 standard drinks    Types: 1 Glasses of wine per week    Comment: occasionally   family history includes Diabetes in her mother; Hypertension in her father; Lung cancer in her father.    ROS: negative except as noted in the HPI  Medications: Current Outpatient Medications  Medication Sig Dispense Refill  . acetaminophen (TYLENOL) 650 MG CR tablet Take 2 tablets (1,300 mg total) by mouth 2 (two) times daily as needed for pain.    Marland Kitchen amLODipine (NORVASC) 5 MG tablet Take 1 tablet (5 mg total) by mouth daily. [place]  tablet 0  . aspirin EC 81 MG tablet Take 81 mg by mouth daily.    Marland Kitchen atorvastatin (LIPITOR) 40 MG tablet TAKE 1 TABLET BY MOUTH EVERYDAY AT BEDTIME 90 tablet 1  . Calcium Carb-Cholecalciferol (CALCIUM 600 + D PO) Take 2 tablets by mouth daily.    . cetirizine (ZYRTEC) 10 MG tablet Take 1 tablet (10 mg total) by mouth daily as needed for allergies.    . Cholecalciferol (VITAMIN D) 2000 units tablet Take 2,000 Units by mouth daily.    . Diclofenac Sodium 2 % SOLN Place 2 sprays onto the skin 2 (two) times daily. 1 Bottle 11  . docusate sodium (COLACE) 100 MG capsule Take 100 mg by mouth 2 (two) times daily.    Marland Kitchen EPINEPHrine (EPIPEN 2-PAK) 0.3 mg/0.3 mL IJ SOAJ injection Inject 0.3 mLs (0.3 mg total) into the muscle once as needed. (Patient  taking differently: Inject 0.3 mg into the muscle once as needed (bee stings). ) 2 Device 1  . ferrous sulfate 325 (65 FE) MG tablet Take 325 mg by mouth 2 (two) times daily.    . fluticasone (FLONASE) 50 MCG/ACT nasal spray USE 1 SPRAY INTO EACH NOSTRIL EVERY DAY 16 g 1  . gabapentin (NEURONTIN) 300 MG capsule TAKE 1 CAPSULE BY MOUTH EVERYDAY AT BEDTIME 90 capsule 1  . hydroxychloroquine (PLAQUENIL) 200 MG tablet Take 200 mg by mouth 2 (two) times daily.     Marland Kitchen leflunomide (ARAVA) 20 MG tablet     . leucovorin (WELLCOVORIN) 5 MG tablet Take 5 mg by mouth every Monday, Tuesday, Wednesday, Thursday, and Friday. Except for Saturday/Sunday    . levothyroxine (SYNTHROID) 50 MCG tablet Take 1 tablet (50 mcg total) by mouth daily before breakfast. Must get labs before any further refills 10 tablet 0  . Olopatadine HCl 0.2 % SOLN Place 1 drop into both eyes daily as needed (allergies).    . ondansetron (ZOFRAN) 4 MG tablet     . quinapril (ACCUPRIL) 40 MG tablet Take 1 tablet (40 mg total) by mouth daily. 90 tablet 0  . traZODone (DESYREL) 100 MG tablet TAKE 1/2 TO 1 TABLET BY MOUTH AT BEDTIME AS NEEDED FOR SLEEP 90 tablet 1  . valACYclovir (VALTREX) 1000 MG tablet Take 1 tablet (1,000 mg total) by mouth 3 (three) times daily. 21 tablet 0   No current facility-administered medications for this visit.    Allergies  Allergen Reactions  . Bee Venom Anaphylaxis  . Methotrexate Derivatives Other (See Comments)    Acute kidney injury  . Oxycodone Other (See Comments)    Adrenal crisis while taking both prednisone and oxycodone   . Prednisone     Adrenal crisis while taking both prednisone and oxycodone   . Verapamil Hives  . Nickel Rash  . Nitrofuran Derivatives Rash       Objective:  BP (!) 154/78   Pulse 69   Temp 98 F (36.7 C) (Oral)   Wt 197 lb (89.4 kg)   BMI 34.90 kg/m  Gen:  alert, not ill-appearing, no distress, appropriate for age 5: head normocephalic without obvious  abnormality, conjunctiva and cornea clear, trachea midline Pulm: Normal work of breathing, normal phonation, clear to auscultation bilaterally, no wheezes, rales or rhonchi CV: Normal rate, regular rhythm, s1 and s2 distinct, no murmurs, clicks or rubs  Neuro: alert and oriented x 3, no tremor MSK: extremities atraumatic, normal gait and station Skin: Red, swollen vesicular rash with scabbing and crusting of left forehead,  scalp, upper eyelid and medial canthus     Lab Results  Component Value Date   CREATININE 1.28 (H) 02/22/2018   BUN 21 02/22/2018   NA 139 02/22/2018   K 4.2 02/22/2018   CL 104 02/22/2018   CO2 26 02/22/2018     Assessment and Plan: 69 y.o. female with   .Trish was seen today for herpes zoster.  Diagnoses and all orders for this visit:  Drug-induced nausea and vomiting -     ondansetron (ZOFRAN) tablet 4 mg -     ondansetron (ZOFRAN-ODT) 4 MG disintegrating tablet; Take 1 tablet (4 mg total) by mouth every 8 (eight) hours as needed for nausea or vomiting.  Carotid stenosis, asymptomatic, bilateral -     atorvastatin (LIPITOR) 40 MG tablet; TAKE 1 TABLET BY MOUTH EVERYDAY AT BEDTIME  Herpes zoster ophthalmicus of left eye -     traMADol (ULTRAM) 50 MG tablet; Take 1 tablet (50 mg total) by mouth every 6 (six) hours as needed for up to 5 days for moderate pain.  Cellulitis of face -     doxycycline (VIBRA-TABS) 100 MG tablet; Take 1 tablet (100 mg total) by mouth 2 (two) times daily for 7 days. -     CBC with Differential/Platelet  Essential hypertension -     Cancel: Renal Profile with Estimated GFR -     Urine Microalbumin w/creat. ratio  CKD (chronic kidney disease) stage 3, GFR 30-59 ml/min (HCC) -     Cancel: Renal Profile with Estimated GFR -     Urine Microalbumin w/creat. ratio -     CBC with Differential/Platelet -     Renal Profile with Estimated GFR  Hypothyroidism, unspecified type -     Cancel: TSH -     TSH  History of anemia  -     CBC  Hyperglycemia, unspecified -     Hemoglobin A1c  Encounter for hepatitis C screening test for low risk patient -     Hepatitis C antibody   Shingles Completed 7 day course of Valtrex Following with Opth for left eyelid involvement Starting Doxycycline today to cover for secondary bacterial infection Tylenol 650 mg Q8H for pain Tramadol 50 mg Q6-8H prn for breakthrough pain Zofran 4 mg odt Q8H prn for nausea Reviewed current medication. Potential QT prolongation with Tramadol and Zofran, but she is holding her Plaquenil and Arava currently and plan to only use these medications prn for 1 week or less. Checking renal function today We discussed option of Prednisone. Discussed potential benefits including decreased pain and inflammation. Discussed potential risks including bacterial super-infection and adrenal effects. She has a history of adrenal crisis with Prednisone and does not wish to take this medication until absolutely necessary   Patient education and anticipatory guidance given Patient agrees with treatment plan Follow-up in 1 week or sooner as needed if symptoms worsen or fail to improve  Darlyne Russian PA-C

## 2019-05-15 ENCOUNTER — Other Ambulatory Visit: Payer: Self-pay | Admitting: Physician Assistant

## 2019-05-15 LAB — CBC WITH DIFFERENTIAL/PLATELET
Absolute Monocytes: 686 cells/uL (ref 200–950)
Basophils Absolute: 69 cells/uL (ref 0–200)
Basophils Relative: 1.4 %
Eosinophils Absolute: 103 cells/uL (ref 15–500)
Eosinophils Relative: 2.1 %
HCT: 40.6 % (ref 35.0–45.0)
Hemoglobin: 13.2 g/dL (ref 11.7–15.5)
Lymphs Abs: 1333 cells/uL (ref 850–3900)
MCH: 29.9 pg (ref 27.0–33.0)
MCHC: 32.5 g/dL (ref 32.0–36.0)
MCV: 91.9 fL (ref 80.0–100.0)
MPV: 11 fL (ref 7.5–12.5)
Monocytes Relative: 14 %
Neutro Abs: 2710 cells/uL (ref 1500–7800)
Neutrophils Relative %: 55.3 %
Platelets: 273 10*3/uL (ref 140–400)
RBC: 4.42 10*6/uL (ref 3.80–5.10)
RDW: 12.2 % (ref 11.0–15.0)
Total Lymphocyte: 27.2 %
WBC: 4.9 10*3/uL (ref 3.8–10.8)

## 2019-05-15 LAB — RENAL PROFILE WITH ESTIMATED GFR
Albumin: 4 g/dL (ref 3.6–5.1)
BUN/Creatinine Ratio: 13 (calc) (ref 6–22)
BUN: 16 mg/dL (ref 7–25)
CO2: 27 mmol/L (ref 20–32)
Calcium: 10.2 mg/dL (ref 8.6–10.4)
Chloride: 102 mmol/L (ref 98–110)
Creat: 1.19 mg/dL — ABNORMAL HIGH (ref 0.60–0.93)
GFR, Est African American: 54 mL/min/{1.73_m2} — ABNORMAL LOW (ref >=60)
GFR, Est Non African American: 46 mL/min/{1.73_m2} — ABNORMAL LOW (ref >=60)
Glucose, Bld: 92 mg/dL (ref 65–99)
Phosphorus: 3.7 mg/dL (ref 2.1–4.3)
Potassium: 4.6 mmol/L (ref 3.5–5.3)
Sodium: 138 mmol/L (ref 135–146)

## 2019-05-15 LAB — TSH: TSH: 1.28 mIU/L (ref 0.40–4.50)

## 2019-05-15 MED ORDER — LEVOTHYROXINE SODIUM 50 MCG PO TABS: 50.0000 ug | ORAL_TABLET | Freq: Every day | ORAL | 3 refills | Status: DC

## 2019-05-17 ENCOUNTER — Telehealth: Payer: Self-pay

## 2019-05-17 NOTE — Telephone Encounter (Signed)
Have her discontinue both zofran and doxycycline Start OTC Omeprazole 20 mg twice a day Follow-up in 1 week or sooner if needed

## 2019-05-17 NOTE — Telephone Encounter (Signed)
Pt notified -EH/RMA  

## 2019-05-17 NOTE — Telephone Encounter (Signed)
Pt left vm stating that Zofran isn't working for her.  She said that she is still burping and vomiting.  She also thinks that the antibiotic is too strong and is part of the cause of her vomiting. Please advise. -EH/RMA

## 2019-05-21 ENCOUNTER — Ambulatory Visit (INDEPENDENT_AMBULATORY_CARE_PROVIDER_SITE_OTHER): Payer: Medicare HMO | Admitting: Physician Assistant

## 2019-05-21 ENCOUNTER — Encounter: Payer: Self-pay | Admitting: Physician Assistant

## 2019-05-21 ENCOUNTER — Other Ambulatory Visit: Payer: Self-pay

## 2019-05-21 VITALS — BP 100/66 | HR 75 | Temp 97.7°F | Wt 192.0 lb

## 2019-05-21 DIAGNOSIS — R1114 Bilious vomiting: Secondary | ICD-10-CM

## 2019-05-21 DIAGNOSIS — B0230 Zoster ocular disease, unspecified: Secondary | ICD-10-CM

## 2019-05-21 DIAGNOSIS — B0229 Other postherpetic nervous system involvement: Secondary | ICD-10-CM | POA: Diagnosis not present

## 2019-05-21 MED ORDER — OMEPRAZOLE 20 MG PO CPDR: 20.0000 mg | DELAYED_RELEASE_CAPSULE | Freq: Every day | ORAL | 1 refills | Status: DC

## 2019-05-21 MED ORDER — OMEPRAZOLE 20 MG PO CPDR: 20.0000 mg | DELAYED_RELEASE_CAPSULE | Freq: Two times a day (BID) | ORAL | 1 refills | Status: DC

## 2019-05-21 NOTE — Patient Instructions (Signed)
Postherpetic Neuralgia Postherpetic neuralgia (PHN) is nerve pain that occurs after a shingles infection. Shingles is a painful rash that appears on one area of the body, usually on the trunk or face. Shingles is caused by the varicella-zoster virus. This is the same virus that causes chickenpox. In people who have had chickenpox, the virus can resurface years later and cause shingles. You may have PHN if you continue to have pain for 4 months after your shingles rash has gone away. PHN appears in the same area where you had the shingles rash. The pain usually goes away after the rash disappears. Getting a vaccination for shingles can prevent PHN. This vaccine is recommended for people older than 60. It may prevent shingles, and may also lower your risk of PHN if you do get shingles. What are the causes? This condition is caused by damage to your nerves from the varicella-zoster virus. The damage makes your nerves overly sensitive. What increases the risk? The following factors may make you more likely to develop this condition:  Being older than 70 years of age.  Having severe pain before your shingles rash starts.  Having a severe rash.  Having shingles in and around the eye area.  Having a disease that makes your body unable to fight infections (weak immune system). What are the signs or symptoms? The main symptom of this condition is pain. The pain may:  Often be very bad and may be described as stabbing, burning, or feeling like an electric shock.  Come and go or may be there all the time.  Be triggered by light touches on the skin or changes in temperature. You may have itching along with the pain. How is this diagnosed? This condition may be diagnosed based on your symptoms and your history of shingles. Lab studies and other diagnostic tests are usually not needed. How is this treated? There is no cure for this condition. Treatment for PHN will focus on pain relief.  Over-the-counter pain relievers do not usually relieve PHN pain. You may need to work with a pain specialist. Treatment may include:  Antidepressant medicines to help with pain and improve sleep.  Anti-seizure medicines to relieve nerve pain.  Strong pain relievers (opioids).  A numbing patch worn on the skin (lidocaine patch).  Botox (botulinum toxin) injections to block pain signals between nerves and muscles.  Injections of numbing medicine or anti-inflammatory medicines around irritated nerves. Follow these instructions at home:   It may take a long time to recover from PHN. Work closely with your health care provider and develop a good support system at home.  Take over-the-counter and prescription medicines only as told by your health care provider.  Do not drive or use heavy machinery while taking prescription pain medicine.  Wear loose, comfortable clothing.  Cover sensitive areas with a dressing to reduce friction from clothing rubbing on the area.  If directed, put ice on the painful area: ? Put ice in a plastic bag. ? Place a towel between your skin and the bag. ? Leave the ice on for 20 minutes, 2-3 times a day.  Talk to your health care provider if you feel depressed or desperate. Living with long-term pain can be depressing.  Keep all follow-up visits as told by your health care provider. This is important. Contact a health care provider if:  Your medicine is not helping.  You are struggling to manage your pain at home. Summary  Postherpetic neuralgia is a very painful disorder   that can occur after an episode of shingles.  The pain is often severe, burning, electric, or stabbing.  Prescription medicines can be helpful in managing persistent pain.  Getting a vaccination for shingles can prevent PHN. This vaccine is recommended for people older than 60. This information is not intended to replace advice given to you by your health care provider. Make sure  you discuss any questions you have with your health care provider. Document Released: 12/11/2002 Document Revised: 09/02/2017 Document Reviewed: 12/07/2016 Elsevier Patient Education  2020 [employer]  

## 2019-05-21 NOTE — Progress Notes (Signed)
HPI:                                                                Melody Moore is a 68 y.o. female who presents to [facility] today for Shingles follow-up  Pleasant 68 yo F with PMH RA, immunosuppresion, CKD, adrenal crisis, hypothyroidism who was diagnosed with shingles involving her left forehead and scalp on 05/06/19. She was started on Valtrex 1000 mg tid and has been taking this as prescribed. Reports today she developed a "film over her left eye" with "cloudy" vision and left eyelid swelling. She states rash has spread to the left temple adjacent to her eye with purulent material coming out.   Interval hx 05/14/19 She was evaluated by Ophth last week and thankfully did not have any lesions on her eye and is using OTC eye lubricant. She has a follow-up appt tomorrow. Denies vision change today She reports rash is exquisitely painful and she has not been taking any pain relievers She finished Valtrex yesterday. She reports she has been having severe nausea and intermittent vomiting, which she attributed to the medication.  She contacted her Rheumatologist and she is not currently taking Plaquenil or Arava until Shingles resolves  Interval hx 05/21/19 She has been having intermittent left-sided headaches for the last week. Described as "electric shock" sensations in her left parietal area. Occasionally there is a dull ache that is more persistent. Pain is moderate. No associated vision change, nausea/vomiting, gait disturbance, or focal symptoms. She was having severe nausea and vomiting with the Doxycycline and this was discontinued. Zofran was not helpful. She was started on OTC Omeprazole 20 mg twice daily on 8/13. Reports she has not had any additional vomiting for the last 2 days. Denies fever, chills, hematemesis, change in bowel habits, melena.  Past Medical History:  Diagnosis Date  . 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 AKI (acute  kidney injury) ([place]) 02/2016  . Allergy   . Arthritis   . Carotid stenosis, asymptomatic, bilateral 05/26/2017   Dopplers 02/12/2016: right - mid-distal 40-59% stenosis; left - 40-59% stenosis  . Cataract   . CKD (chronic kidney disease) stage 3, GFR 30-59 ml/min ([facility]) 11/02/2017  . Decreased calculated GFR 06/15/2017  . Gallstones 06/22/2017  . History of echocardiogram 11/07/2017   04/09/2016: EF 93%, mild diastolic dysfunction, trivial MR and TR  . Hypertension   . Hypothyroidism   . 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 Rheumatoid arthritis ([place])   . Rheumatoid arthritis involving multiple sites ([place]) 05/26/2017  . Transaminitis 06/15/2017   Normal abdominal US 06/22/17   Past Surgical History:  Procedure Laterality Date  . APPENDECTOMY    . CATARACT EXTRACTION, BILATERAL  2015  . 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  . TOTAL KNEE ARTHROPLASTY Right 12/16/2017   Procedure: RIGHT TOTAL KNEE ARTHROPLASTY;  Surgeon: Dorna Leitz, MD;  Location: [facility];  Service: Orthopedics;  Laterality: Right;  Adductor Block   Social History   Tobacco Use  . Smoking status: Former Smoker    Types: Cigarettes    Quit date: 02/07/1974    Years since quitting: 45.3  . Smokeless tobacco: Never Used  Substance Use Topics  . Alcohol use: Yes    Alcohol/week:  1.0 standard drinks    Types: 1 Glasses of wine per week    Comment: occasionally   family history includes Diabetes in her mother; Hypertension in her father; Lung cancer in her father.    ROS: negative except as noted in the HPI  Medications: Current Outpatient Medications  Medication Sig Dispense Refill  . acetaminophen (TYLENOL) 650 MG CR tablet Take 2 tablets (1,300 mg total) by mouth 2 (two) times daily as needed for pain.    Marland Kitchen amLODipine (NORVASC) 5 MG tablet Take 1 tablet (5 mg total) by mouth daily. 90 tablet 0  . aspirin EC 81 MG tablet Take 81 mg by mouth daily.    Marland Kitchen atorvastatin (LIPITOR) 40 MG tablet TAKE 1 TABLET BY  MOUTH EVERYDAY AT BEDTIME 90 tablet 1  . Calcium Carb-Cholecalciferol (CALCIUM 600 + D PO) Take 2 tablets by mouth daily.    . cetirizine (ZYRTEC) 10 MG tablet Take 1 tablet (10 mg total) by mouth daily as needed for allergies.    . Cholecalciferol (VITAMIN D) 2000 units tablet Take 2,000 Units by mouth daily.    . Diclofenac Sodium 2 % SOLN Place 2 sprays onto the skin 2 (two) times daily. 1 Bottle 11  . docusate sodium (COLACE) 100 MG capsule Take 100 mg by mouth 2 (two) times daily.    Marland Kitchen EPINEPHrine (EPIPEN 2-PAK) 0.3 mg/0.3 mL IJ SOAJ injection Inject 0.3 mLs (0.3 mg total) into the muscle once as needed. (Patient taking differently: Inject 0.3 mg into the muscle once as needed (bee stings). ) 2 Device 1  . ferrous sulfate 325 (65 FE) MG tablet Take 325 mg by mouth 2 (two) times daily.    . fluticasone (FLONASE) 50 MCG/ACT nasal spray USE 1 SPRAY INTO EACH NOSTRIL EVERY DAY 16 g 1  . gabapentin (NEURONTIN) 300 MG capsule TAKE 1 CAPSULE BY MOUTH EVERYDAY AT BEDTIME 90 capsule 1  . hydroxychloroquine (PLAQUENIL) 200 MG tablet Take 200 mg by mouth 2 (two) times daily.     Marland Kitchen leflunomide (ARAVA) 20 MG tablet     . leucovorin (WELLCOVORIN) 5 MG tablet Take 5 mg by mouth every Monday, Tuesday, Wednesday, Thursday, and Friday. Except for Saturday/Sunday    . levothyroxine (SYNTHROID) 50 MCG tablet Take 1 tablet (50 mcg total) by mouth daily before breakfast. 90 tablet 3  . Olopatadine HCl 0.2 % SOLN Place 1 drop into both eyes daily as needed (allergies).    . quinapril (ACCUPRIL) 40 MG tablet Take 1 tablet (40 mg total) by mouth daily. 90 tablet 0  . traZODone (DESYREL) 100 MG tablet TAKE 1/2 TO 1 TABLET BY MOUTH AT BEDTIME AS NEEDED FOR SLEEP 90 tablet 1  . omeprazole (PRILOSEC) 20 MG capsule Take 1 capsule (20 mg total) by mouth 2 (two) times daily before a meal for 14 days. 90 capsule 1   No current facility-administered medications for this visit.    Allergies  Allergen Reactions  . Bee Venom  Anaphylaxis  . Methotrexate Derivatives Other (See Comments)    Acute kidney injury  . Oxycodone Other (See Comments)    Adrenal crisis while taking both prednisone and oxycodone   . Prednisone     Adrenal crisis while taking both prednisone and oxycodone   . Verapamil Hives  . Nickel Rash  . Nitrofuran Derivatives Rash       Objective:  BP 100/66   Pulse 75   Temp 97.7 F (36.5 C) (Oral)   Wt 192 lb (  87.1 kg)   BMI 34.01 kg/m  Gen: well-groomed, not ill-appearing, no acute distress HEENT: head normocephalic, atraumatic; conjunctiva and cornea clear,  PERRL, EOMs intact Pulm: Normal work of breathing, normal phonation GI: abdomen soft, non-tender, no epigastric tenderness, no guarding or rigidity, no palpable masses Neuro: alert and oriented x 3 Skin: Red, swollen vesicular rash with scabbing and crusting of left forehead and scalp, left upper eyelid with mild redness and swelling    Recent Results (from the past 2160 hour(s))  CBC with Differential/Platelet     Status: None   Collection Time: 05/14/19 12:03 PM  Result Value Ref Range   WBC 4.9 3.8 - 10.8 Thousand/uL   RBC 4.42 3.80 - 5.10 Million/uL   Hemoglobin 13.2 11.7 - 15.5 g/dL   HCT 40.6 35.0 - 45.0 %   MCV 91.9 80.0 - 100.0 fL   MCH 29.9 27.0 - 33.0 pg   MCHC 32.5 32.0 - 36.0 g/dL   RDW 12.2 11.0 - 15.0 %   Platelets 273 140 - 400 Thousand/uL   MPV 11.0 7.5 - 12.5 fL   Neutro Abs 2,710 1,500 - 7,800 cells/uL   Lymphs Abs 1,333 850 - 3,900 cells/uL   Absolute Monocytes 686 200 - 950 cells/uL   Eosinophils Absolute 103 15 - 500 cells/uL   Basophils Absolute 69 0 - 200 cells/uL   Neutrophils Relative % 55.3 %   Total Lymphocyte 27.2 %   Monocytes Relative 14.0 %   Eosinophils Relative 2.1 %   Basophils Relative 1.4 %  TSH     Status: None   Collection Time: 05/14/19 12:03 PM  Result Value Ref Range   TSH 1.28 0.40 - 4.50 mIU/L  Renal Profile with Estimated GFR     Status: Abnormal   Collection Time:  05/14/19 12:03 PM  Result Value Ref Range   Glucose, Bld 92 65 - 99 mg/dL    Comment: .            Fasting reference interval .    BUN 16 7 - 25 mg/dL   Creat 1.19 (H) 0.60 - 0.93 mg/dL    Comment: For patients >15 years of age, the reference limit for Creatinine is approximately 13% higher for people identified as African-American. .    GFR, Est Non African American 46 (L) > OR = 60 mL/min/1.63m2   GFR, Est African American 54 (L) > OR = 60 mL/min/1.51m2   BUN/Creatinine Ratio 13 6 - 22 (calc)   Sodium 138 135 - 146 mmol/L   Potassium 4.6 3.5 - 5.3 mmol/L   Chloride 102 98 - 110 mmol/L   CO2 27 20 - 32 mmol/L   Calcium 10.2 8.6 - 10.4 mg/dL   Phosphorus 3.7 2.1 - 4.3 mg/dL   Albumin 4.0 3.6 - 5.1 g/dL    Assessment and Plan: 68 y.o. female with   .Cherita was seen today for follow-up.  Diagnoses and all orders for this visit:  Postherpetic neuralgia  Herpes zoster ophthalmicus of left eye  Bilious vomiting with nausea -     CBC with Differential/Platelet -     Lipase -     COMPLETE METABOLIC PANEL WITH GFR -     Discontinue: omeprazole (PRILOSEC) 20 MG capsule; Take 1 capsule (20 mg total) by mouth daily for 14 days. -     omeprazole (PRILOSEC) 20 MG capsule; Take 1 capsule (20 mg total) by mouth 2 (two) times daily before a meal for 14 days.  Shingles, PHN She had a severe case of facial shingles involving the left eyelid. Completed 7 day course of Valtrex Following with Opth for left eyelid involvement, appointment is in 2 days She is now experiencing some post-herpetic neuralgia of the head/scalp Cont Tylenol 650 mg Q8H for pain Tramadol 50 mg Q6-8H prn for breakthrough pain Advised she can increase her Gabapentin to 300 mg three times daily. Cautioned regarding sedation and fall risk She will continue to communicate with her Rheumatologist regarding when it is okay to resume anti-rheumatic medications  Nausea/vomiting Appears to be medication induced.  Started with Valtrex and worsened with Doxycycline No symptoms for the last 2 days. Improved with PPI. Benign abdominal exam Cont Omeprazole 20 mg bid for the next 2 weeks Will check CBC, CMP and lipase today and make sure there is no pre-renal AKI  Patient education and anticipatory guidance given Patient agrees with treatment plan Follow-up in 1 month or sooner as needed if symptoms worsen or fail to improve  Darlyne Russian PA-C

## 2019-05-22 ENCOUNTER — Encounter: Payer: Self-pay | Admitting: Physician Assistant

## 2019-05-22 LAB — CBC WITH DIFFERENTIAL/PLATELET
Absolute Monocytes: 718 cells/uL (ref 200–950)
Basophils Absolute: 69 cells/uL (ref 0–200)
Basophils Relative: 1.1 %
Eosinophils Absolute: 82 cells/uL (ref 15–500)
Eosinophils Relative: 1.3 %
HCT: 41 % (ref 35.0–45.0)
Hemoglobin: 13.7 g/dL (ref 11.7–15.5)
Lymphs Abs: 1651 cells/uL (ref 850–3900)
MCH: 31.1 pg (ref 27.0–33.0)
MCHC: 33.4 g/dL (ref 32.0–36.0)
MCV: 93 fL (ref 80.0–100.0)
MPV: 11.2 fL (ref 7.5–12.5)
Monocytes Relative: 11.4 %
Neutro Abs: 3780 cells/uL (ref 1500–7800)
Neutrophils Relative %: 60 %
Platelets: 263 10*3/uL (ref 140–400)
RBC: 4.41 10*6/uL (ref 3.80–5.10)
RDW: 12.6 % (ref 11.0–15.0)
Total Lymphocyte: 26.2 %
WBC: 6.3 10*3/uL (ref 3.8–10.8)

## 2019-05-22 LAB — COMPLETE METABOLIC PANEL WITH GFR
AG Ratio: 1.9 (calc) (ref 1.0–2.5)
ALT: 14 U/L (ref 6–29)
AST: 20 U/L (ref 10–35)
Albumin: 4.1 g/dL (ref 3.6–5.1)
Alkaline phosphatase (APISO): 74 U/L (ref 37–153)
BUN/Creatinine Ratio: 16 (calc) (ref 6–22)
BUN: 21 mg/dL (ref 7–25)
CO2: 27 mmol/L (ref 20–32)
Calcium: 9.8 mg/dL (ref 8.6–10.4)
Chloride: 101 mmol/L (ref 98–110)
Creat: 1.28 mg/dL — ABNORMAL HIGH (ref 0.60–0.93)
GFR, Est African American: 49 mL/min/{1.73_m2} — ABNORMAL LOW (ref >=60)
GFR, Est Non African American: 42 mL/min/{1.73_m2} — ABNORMAL LOW (ref >=60)
Globulin: 2.2 g/dL (calc) (ref 1.9–3.7)
Glucose, Bld: 96 mg/dL (ref 65–99)
Potassium: 4.4 mmol/L (ref 3.5–5.3)
Sodium: 138 mmol/L (ref 135–146)
Total Bilirubin: 0.5 mg/dL (ref 0.2–1.2)
Total Protein: 6.3 g/dL (ref 6.1–8.1)

## 2019-05-22 LAB — LIPASE: Lipase: 78 U/L — ABNORMAL HIGH (ref 7–60)

## 2019-05-23 DIAGNOSIS — B029 Zoster without complications: Secondary | ICD-10-CM | POA: Diagnosis not present

## 2019-05-23 DIAGNOSIS — Z961 Presence of intraocular lens: Secondary | ICD-10-CM | POA: Diagnosis not present

## 2019-05-28 ENCOUNTER — Other Ambulatory Visit: Payer: Self-pay | Admitting: Physician Assistant

## 2019-05-28 DIAGNOSIS — I129 Hypertensive chronic kidney disease with stage 1 through stage 4 chronic kidney disease, or unspecified chronic kidney disease: Secondary | ICD-10-CM

## 2019-05-28 DIAGNOSIS — N183 Chronic kidney disease, stage 3 unspecified: Secondary | ICD-10-CM

## 2019-06-10 ENCOUNTER — Other Ambulatory Visit: Payer: Self-pay | Admitting: Physician Assistant

## 2019-06-10 DIAGNOSIS — N183 Chronic kidney disease, stage 3 unspecified: Secondary | ICD-10-CM

## 2019-06-10 DIAGNOSIS — I129 Hypertensive chronic kidney disease with stage 1 through stage 4 chronic kidney disease, or unspecified chronic kidney disease: Secondary | ICD-10-CM

## 2019-06-21 DIAGNOSIS — D3142 Benign neoplasm of left ciliary body: Secondary | ICD-10-CM | POA: Diagnosis not present

## 2019-06-21 DIAGNOSIS — H26493 Other secondary cataract, bilateral: Secondary | ICD-10-CM | POA: Diagnosis not present

## 2019-06-21 DIAGNOSIS — Z961 Presence of intraocular lens: Secondary | ICD-10-CM | POA: Diagnosis not present

## 2019-06-21 DIAGNOSIS — M059 Rheumatoid arthritis with rheumatoid factor, unspecified: Secondary | ICD-10-CM | POA: Diagnosis not present

## 2019-06-21 DIAGNOSIS — H527 Unspecified disorder of refraction: Secondary | ICD-10-CM | POA: Diagnosis not present

## 2019-06-21 DIAGNOSIS — B029 Zoster without complications: Secondary | ICD-10-CM | POA: Diagnosis not present

## 2019-06-21 DIAGNOSIS — Z79899 Other long term (current) drug therapy: Secondary | ICD-10-CM | POA: Diagnosis not present

## 2019-06-22 ENCOUNTER — Ambulatory Visit (INDEPENDENT_AMBULATORY_CARE_PROVIDER_SITE_OTHER): Payer: Medicare HMO | Admitting: Physician Assistant

## 2019-06-22 ENCOUNTER — Other Ambulatory Visit: Payer: Self-pay

## 2019-06-22 ENCOUNTER — Encounter: Payer: Self-pay | Admitting: Physician Assistant

## 2019-06-22 VITALS — BP 144/87 | HR 80 | Temp 97.8°F | Wt 196.0 lb

## 2019-06-22 DIAGNOSIS — B0229 Other postherpetic nervous system involvement: Secondary | ICD-10-CM

## 2019-06-22 DIAGNOSIS — R51 Headache: Secondary | ICD-10-CM | POA: Diagnosis not present

## 2019-06-22 DIAGNOSIS — R519 Headache, unspecified: Secondary | ICD-10-CM

## 2019-06-22 MED ORDER — GABAPENTIN 100 MG PO CAPS: 100.0000 mg | ORAL_CAPSULE | Freq: Three times a day (TID) | ORAL | 5 refills | Status: DC

## 2019-06-22 NOTE — Progress Notes (Signed)
HPI:                                                                Melody Moore is a 68 y.o. female who presents to [facility] today for Shingles follow-up  Pleasant 68 yo F with PMH RA, immunosuppresion, CKD, adrenal crisis, hypothyroidism who was diagnosed with shingles involving her left forehead and scalp on 05/06/19. She was started on Valtrex 1000 mg tid and has been taking this as prescribed. Reports today she developed a "film over her left eye" with "cloudy" vision and left eyelid swelling. She states rash has spread to the left temple adjacent to her eye with purulent material coming out.   Interval hx 05/14/19 She was evaluated by Ophth last week and thankfully did not have any lesions on her eye and is using OTC eye lubricant. She has a follow-up appt tomorrow. Denies vision change today She reports rash is exquisitely painful and she has not been taking any pain relievers She finished Valtrex yesterday. She reports she has been having severe nausea and intermittent vomiting, which she attributed to the medication.  She contacted her Rheumatologist and she is not currently taking Plaquenil or Arava until Shingles resolves  Interval hx 05/21/19 She has been having intermittent left-sided headaches for the last week. Described as "electric shock" sensations in her left parietal area. Occasionally there is a dull ache that is more persistent. Pain is moderate. No associated vision change, nausea/vomiting, gait disturbance, or focal symptoms. She was having severe nausea and vomiting with the Doxycycline and this was discontinued. Zofran was not helpful. She was started on OTC Omeprazole 20 mg twice daily on 8/13. Reports she has not had any additional vomiting for the last 2 days. Denies fever, chills, hematemesis, change in bowel habits, melena.  Interval hx 06/22/19 Reports this is the first week she is starting to feel normal again.  Her fatigue has improved and she has not had any additional nausea and vomiting since starting PPI She is still having left-sided headaches, that are migratory. She has been managing with Tylenol because the Gabapentin 300 mg makes her drowsy.  Recent labs completed 8/17 show stable CKD and normal CBC She has been following with Opth. And has not had any significant vision involvement  Past Medical History:  Diagnosis Date  . 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 AKI (acute kidney injury) ([place]) 02/2016  . Allergy   . Arthritis   . Carotid stenosis, asymptomatic, bilateral 05/26/2017   Dopplers 02/12/2016: right - mid-distal 40-59% stenosis; left - 40-59% stenosis  . Cataract   . CKD (chronic kidney disease) stage 3, GFR 30-59 ml/min (HCC) 11/02/2017  . Decreased calculated GFR 06/15/2017  . Gallstones 06/22/2017  . History of echocardiogram 11/07/2017   04/09/2016: EF [id number], mild diastolic dysfunction, trivial MR and TR  . Hypertension   . Hypothyroidism   . 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 Rheumatoid arthritis ([place])   . Rheumatoid arthritis involving multiple sites ([place]) 05/26/2017  . Transaminitis 06/15/2017   Normal abdominal US 06/22/17   Past Surgical History:  Procedure Laterality Date  . APPENDECTOMY    . CATARACT EXTRACTION, BILATERAL  2015  . 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  .  TOTAL KNEE ARTHROPLASTY Right 12/16/2017   Procedure: RIGHT TOTAL KNEE ARTHROPLASTY;  Surgeon: Dorna Leitz, MD;  Location: [facility];  Service: Orthopedics;  Laterality: Right;  Adductor Block   Social History   Tobacco Use  . Smoking status: Former Smoker    Types: Cigarettes    Quit date: 02/07/1974    Years since quitting: 45.4  . Smokeless tobacco: Never Used  Substance Use Topics  . Alcohol use: Yes    Alcohol/week: 1.0 standard drinks    Types: 1 Glasses of wine per week    Comment: occasionally   family history includes Diabetes in her mother; Hypertension in her father; Lung  cancer in her father.    ROS: negative except as noted in the HPI  Medications: Current Outpatient Medications  Medication Sig Dispense Refill  . acetaminophen (TYLENOL) 650 MG CR tablet Take 2 tablets (1,300 mg total) by mouth 2 (two) times daily as needed for pain.    Marland Kitchen amLODipine (NORVASC) 5 MG tablet TAKE 1 TABLET BY MOUTH EVERY DAY 90 tablet 0  . aspirin EC 81 MG tablet Take 81 mg by mouth daily.    Marland Kitchen atorvastatin (LIPITOR) 40 MG tablet TAKE 1 TABLET BY MOUTH EVERYDAY AT BEDTIME 90 tablet 1  . Calcium Carb-Cholecalciferol (CALCIUM 600 + D PO) Take 2 tablets by mouth daily.    . cetirizine (ZYRTEC) 10 MG tablet Take 1 tablet (10 mg total) by mouth daily as needed for allergies.    . Cholecalciferol (VITAMIN D) 2000 units tablet Take 2,000 Units by mouth daily.    . Diclofenac Sodium 2 % SOLN Place 2 sprays onto the skin 2 (two) times daily. 1 Bottle 11  . docusate sodium (COLACE) 100 MG capsule Take 100 mg by mouth 2 (two) times daily.    Marland Kitchen EPINEPHrine (EPIPEN 2-PAK) 0.3 mg/0.3 mL IJ SOAJ injection Inject 0.3 mLs (0.3 mg total) into the muscle once as needed. (Patient taking differently: Inject 0.3 mg into the muscle once as needed (bee stings). ) 2 Device 1  . ferrous sulfate 325 (65 FE) MG tablet Take 325 mg by mouth 2 (two) times daily.    . fluticasone (FLONASE) 50 MCG/ACT nasal spray USE 1 SPRAY INTO EACH NOSTRIL EVERY DAY 16 g 1  . gabapentin (NEURONTIN) 100 MG capsule Take 1-3 capsules (100-300 mg total) by mouth 3 (three) times daily. 270 capsule 5  . hydroxychloroquine (PLAQUENIL) 200 MG tablet Take 200 mg by mouth 2 (two) times daily.     Marland Kitchen leflunomide (ARAVA) 20 MG tablet     . leucovorin (WELLCOVORIN) 5 MG tablet Take 5 mg by mouth every Monday, Tuesday, Wednesday, Thursday, and Friday. Except for Saturday/Sunday    . levothyroxine (SYNTHROID) 50 MCG tablet Take 1 tablet (50 mcg total) by mouth daily before breakfast. 90 tablet 3  . Olopatadine HCl 0.2 % SOLN Place 1 drop  into both eyes daily as needed (allergies).    Marland Kitchen omeprazole (PRILOSEC) 20 MG capsule Take 1 capsule (20 mg total) by mouth 2 (two) times daily before a meal for 14 days. 90 capsule 1  . quinapril (ACCUPRIL) 40 MG tablet TAKE 1 TABLET BY MOUTH EVERY DAY 90 tablet 0  . traZODone (DESYREL) 100 MG tablet TAKE 1/2 TO 1 TABLET BY MOUTH AT BEDTIME AS NEEDED FOR SLEEP 90 tablet 1   No current facility-administered medications for this visit.    Allergies  Allergen Reactions  . Bee Venom Anaphylaxis  . Methotrexate Derivatives Other (See  Comments)    Acute kidney injury  . Oxycodone Other (See Comments)    Adrenal crisis while taking both prednisone and oxycodone   . Prednisone     Adrenal crisis while taking both prednisone and oxycodone   . Verapamil Hives  . Nickel Rash  . Nitrofuran Derivatives Rash       Objective:  BP (!) 144/87   Pulse 80   Temp 97.8 F (36.6 C) (Oral)   Wt 196 lb (88.9 kg)   BMI 34.72 kg/m  Gen: well-groomed, not ill-appearing, no acute distress HEENT: head normocephalic, atraumatic; conjunctiva and cornea clear,  PERRL, EOMs intact Pulm: Normal work of breathing, normal phonation GI: abdomen soft, non-tender, no epigastric tenderness, no guarding or rigidity, no palpable masses Neuro: alert and oriented x 3 Skin: crusted lesions of left forehead and scalp   Recent Results (from the past 2160 hour(s))  CBC with Differential/Platelet     Status: None   Collection Time: 05/14/19 12:03 PM  Result Value Ref Range   WBC 4.9 3.8 - 10.8 Thousand/uL   RBC 4.42 3.80 - 5.10 Million/uL   Hemoglobin 13.2 11.7 - 15.5 g/dL   HCT 40.6 35.0 - 45.0 %   MCV 91.9 80.0 - 100.0 fL   MCH 29.9 27.0 - 33.0 pg   MCHC 32.5 32.0 - 36.0 g/dL   RDW 12.2 11.0 - 15.0 %   Platelets 273 140 - 400 Thousand/uL   MPV 11.0 7.5 - 12.5 fL   Neutro Abs 2,710 1,500 - 7,800 cells/uL   Lymphs Abs 1,333 850 - 3,900 cells/uL   Absolute Monocytes 686 200 - 950 cells/uL   Eosinophils  Absolute 103 15 - 500 cells/uL   Basophils Absolute 69 0 - 200 cells/uL   Neutrophils Relative % 55.3 %   Total Lymphocyte 27.2 %   Monocytes Relative 14.0 %   Eosinophils Relative 2.1 %   Basophils Relative 1.4 %  TSH     Status: None   Collection Time: 05/14/19 12:03 PM  Result Value Ref Range   TSH 1.28 0.40 - 4.50 mIU/L  Renal Profile with Estimated GFR     Status: Abnormal   Collection Time: 05/14/19 12:03 PM  Result Value Ref Range   Glucose, Bld 92 65 - 99 mg/dL    Comment: .            Fasting reference interval .    BUN 16 7 - 25 mg/dL   Creat 1.19 (H) 0.60 - 0.93 mg/dL    Comment: For patients >43 years of age, the reference limit for Creatinine is approximately 13% higher for people identified as African-American. .    GFR, Est Non African American 46 (L) > OR = 60 mL/min/1.32m2   GFR, Est African American 54 (L) > OR = 60 mL/min/1.4m2   BUN/Creatinine Ratio 13 6 - 22 (calc)   Sodium 138 135 - 146 mmol/L   Potassium 4.6 3.5 - 5.3 mmol/L   Chloride 102 98 - 110 mmol/L   CO2 27 20 - 32 mmol/L   Calcium 10.2 8.6 - 10.4 mg/dL   Phosphorus 3.7 2.1 - 4.3 mg/dL   Albumin 4.0 3.6 - 5.1 g/dL  CBC with Differential/Platelet     Status: None   Collection Time: 05/21/19  9:58 AM  Result Value Ref Range   WBC 6.3 3.8 - 10.8 Thousand/uL   RBC 4.41 3.80 - 5.10 Million/uL   Hemoglobin 13.7 11.7 - 15.5 g/dL   HCT 41.0  35.0 - 45.0 %   MCV 93.0 80.0 - 100.0 fL   MCH 31.1 27.0 - 33.0 pg   MCHC 33.4 32.0 - 36.0 g/dL   RDW 12.6 11.0 - 15.0 %   Platelets 263 140 - 400 Thousand/uL   MPV 11.2 7.5 - 12.5 fL   Neutro Abs 3,780 1,500 - 7,800 cells/uL   Lymphs Abs 1,651 850 - 3,900 cells/uL   Absolute Monocytes 718 200 - 950 cells/uL   Eosinophils Absolute 82 15 - 500 cells/uL   Basophils Absolute 69 0 - 200 cells/uL   Neutrophils Relative % 60 %   Total Lymphocyte 26.2 %   Monocytes Relative 11.4 %   Eosinophils Relative 1.3 %   Basophils Relative 1.1 %  Lipase     Status:  Abnormal   Collection Time: 05/21/19  9:58 AM  Result Value Ref Range   Lipase 78 (H) 7 - 60 U/L  COMPLETE METABOLIC PANEL WITH GFR     Status: Abnormal   Collection Time: 05/21/19  9:58 AM  Result Value Ref Range   Glucose, Bld 96 65 - 99 mg/dL    Comment: .            Fasting reference interval .    BUN 21 7 - 25 mg/dL   Creat 1.28 (H) 0.60 - 0.93 mg/dL    Comment: For patients >61 years of age, the reference limit for Creatinine is approximately 13% higher for people identified as African-American. .    GFR, Est Non African American 42 (L) > OR = 60 mL/min/1.83m2   GFR, Est African American 49 (L) > OR = 60 mL/min/1.11m2   BUN/Creatinine Ratio 16 6 - 22 (calc)   Sodium 138 135 - 146 mmol/L   Potassium 4.4 3.5 - 5.3 mmol/L   Chloride 101 98 - 110 mmol/L   CO2 27 20 - 32 mmol/L   Calcium 9.8 8.6 - 10.4 mg/dL   Total Protein 6.3 6.1 - 8.1 g/dL   Albumin 4.1 3.6 - 5.1 g/dL   Globulin 2.2 1.9 - 3.7 g/dL (calc)   AG Ratio 1.9 1.0 - 2.5 (calc)   Total Bilirubin 0.5 0.2 - 1.2 mg/dL   Alkaline phosphatase (APISO) 74 37 - 153 U/L   AST 20 10 - 35 U/L   ALT 14 6 - 29 U/L    Assessment and Plan: 68 y.o. female with   .There are no diagnoses linked to this encounter. Shingles, PHN She had a severe case of facial shingles involving the left eyelid. Completed 7 day course of Valtrex Following with Opth for left eyelid involvement, appointment is in 2 days PHN of the head/scalp is responding well to Tylenol and Tramadol. Did not tolerate higher dose of Gabapentin She will continue to communicate with her Rheumatologist regarding when it is okay to resume anti-rheumatic medications  Patient education and anticipatory guidance given Patient agrees with treatment plan Follow-up in 1 month or sooner as needed if symptoms worsen or fail to improve  Darlyne Russian PA-C

## 2019-06-22 NOTE — Patient Instructions (Addendum)
[email or web address]  Postherpetic Neuralgia Postherpetic neuralgia (PHN) is nerve pain that occurs after a shingles infection. Shingles is a painful rash that appears on one area of the body, usually on the trunk or face. Shingles is caused by the varicella-zoster virus. This is the same virus that causes chickenpox. In people who have had chickenpox, the virus can resurface years later and cause shingles. You may have PHN if you continue to have pain for 4 months after your shingles rash has gone away. PHN appears in the same area where you had the shingles rash. The pain usually goes away after the rash disappears. Getting a vaccination for shingles can prevent PHN. This vaccine is recommended for people older than 60. It may prevent shingles, and may also lower your risk of PHN if you do get shingles. What are the causes? This condition is caused by damage to your nerves from the varicella-zoster virus. The damage makes your nerves overly sensitive. What increases the risk? The following factors may make you more likely to develop this condition:  Being older than 70 years of age.  Having severe pain before your shingles rash starts.  Having a severe rash.  Having shingles in and around the eye area.  Having a disease that makes your body unable to fight infections (weak immune system). What are the signs or symptoms? The main symptom of this condition is pain. The pain may:  Often be very bad and may be described as stabbing, burning, or feeling like an electric shock.  Come and go or may be there all the time.  Be triggered by light touches on the skin or changes in temperature. You may have itching along with the pain. How is this diagnosed? This condition may be diagnosed based on your symptoms and your history of shingles. Lab studies and other diagnostic tests are usually not needed. How is this treated? There is no cure for this condition. Treatment for  PHN will focus on pain relief. Over-the-counter pain relievers do not usually relieve PHN pain. You may need to work with a pain specialist. Treatment may include:  Antidepressant medicines to help with pain and improve sleep.  Anti-seizure medicines to relieve nerve pain.  Strong pain relievers (opioids).  A numbing patch worn on the skin (lidocaine patch).  Botox (botulinum toxin) injections to block pain signals between nerves and muscles.  Injections of numbing medicine or anti-inflammatory medicines around irritated nerves. Follow these instructions at home:   It may take a long time to recover from PHN. Work closely with your health care provider and develop a good support system at home.  Take over-the-counter and prescription medicines only as told by your health care provider.  Do not drive or use heavy machinery while taking prescription pain medicine.  Wear loose, comfortable clothing.  Cover sensitive areas with a dressing to reduce friction from clothing rubbing on the area.  If directed, put ice on the painful area: ? Put ice in a plastic bag. ? Place a towel between your skin and the bag. ? Leave the ice on for 20 minutes, 2-3 times a day.  Talk to your health care provider if you feel depressed or desperate. Living with long-term pain can be depressing.  Keep all follow-up visits as told by your health care provider. This is important. Contact a health care provider if:  Your medicine is not helping.  You are struggling to manage your pain at home. Summary  Postherpetic neuralgia is a  very painful disorder that can occur after an episode of shingles.  The pain is often severe, burning, electric, or stabbing.  Prescription medicines can be helpful in managing persistent pain.  Getting a vaccination for shingles can prevent PHN. This vaccine is recommended for people older than 60. This information is not intended to replace advice given to you by your  health care provider. Make sure you discuss any questions you have with your health care provider. Document Released: 12/11/2002 Document Revised: 09/02/2017 Document Reviewed: 12/07/2016 Elsevier Patient Education  2020 [employer].

## 2019-06-25 DIAGNOSIS — M150 Primary generalized (osteo)arthritis: Secondary | ICD-10-CM | POA: Diagnosis not present

## 2019-06-25 DIAGNOSIS — M25472 Effusion, left ankle: Secondary | ICD-10-CM | POA: Diagnosis not present

## 2019-06-25 DIAGNOSIS — B0230 Zoster ocular disease, unspecified: Secondary | ICD-10-CM | POA: Diagnosis not present

## 2019-06-25 DIAGNOSIS — B0223 Postherpetic polyneuropathy: Secondary | ICD-10-CM | POA: Diagnosis not present

## 2019-06-25 DIAGNOSIS — M353 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: Secondary | ICD-10-CM | POA: Diagnosis not present

## 2019-06-25 DIAGNOSIS — E669 Obesity, unspecified: Secondary | ICD-10-CM | POA: Diagnosis not present

## 2019-06-25 DIAGNOSIS — M069 Rheumatoid arthritis, unspecified: Secondary | ICD-10-CM | POA: Diagnosis not present

## 2019-06-25 DIAGNOSIS — Z7189 Other specified counseling: Secondary | ICD-10-CM | POA: Diagnosis not present

## 2019-06-25 DIAGNOSIS — R5383 Other fatigue: Secondary | ICD-10-CM | POA: Diagnosis not present

## 2019-07-15 ENCOUNTER — Other Ambulatory Visit: Payer: Self-pay | Admitting: Physician Assistant

## 2019-07-15 DIAGNOSIS — J309 Allergic rhinitis, unspecified: Secondary | ICD-10-CM

## 2019-07-18 ENCOUNTER — Other Ambulatory Visit: Payer: Self-pay | Admitting: Physician Assistant

## 2019-07-19 NOTE — Telephone Encounter (Signed)
Last filled 01/19/2019 #90 with one refill.   Last seen 06/22/2019.    Please advise on refill.

## 2019-07-20 DIAGNOSIS — Z79899 Other long term (current) drug therapy: Secondary | ICD-10-CM | POA: Diagnosis not present

## 2019-07-20 DIAGNOSIS — M059 Rheumatoid arthritis with rheumatoid factor, unspecified: Secondary | ICD-10-CM | POA: Diagnosis not present

## 2019-07-20 DIAGNOSIS — B029 Zoster without complications: Secondary | ICD-10-CM | POA: Diagnosis not present

## 2019-07-20 LAB — HM DIABETES EYE EXAM

## 2019-07-26 DIAGNOSIS — M170 Bilateral primary osteoarthritis of knee: Secondary | ICD-10-CM | POA: Diagnosis not present

## 2019-07-26 DIAGNOSIS — Z96653 Presence of artificial knee joint, bilateral: Secondary | ICD-10-CM | POA: Diagnosis not present

## 2019-08-09 ENCOUNTER — Ambulatory Visit (INDEPENDENT_AMBULATORY_CARE_PROVIDER_SITE_OTHER): Payer: Medicare HMO | Admitting: Sports Medicine

## 2019-08-09 ENCOUNTER — Encounter: Payer: Self-pay | Admitting: Sports Medicine

## 2019-08-09 ENCOUNTER — Ambulatory Visit: Payer: Medicare HMO | Admitting: Osteopathic Medicine

## 2019-08-09 ENCOUNTER — Other Ambulatory Visit: Payer: Self-pay

## 2019-08-09 DIAGNOSIS — B0229 Other postherpetic nervous system involvement: Secondary | ICD-10-CM

## 2019-08-09 NOTE — Progress Notes (Signed)
Subjective:    CC: possible dermatitis  HPI:  Patient had shingles back in July. In October, the pain, tenderness, and shock-like sensation and tingling mostly resolved but still has a rash on her forehead. Describes the rash as itchy, slightly tender, and sensitive. Reports some tingling and numbness in the area. Concerned that the rash is still there and wants to get her hair colored. Also wants to know when she can receive the Shingles shot.    I reviewed the past medical history, family history, social history, surgical history, and allergies today and no changes were needed.  Please see the problem list section below in epic for further details.  Past Medical History: Past Medical History:  Diagnosis Date  . 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 AKI (acute kidney injury) ([place]) 02/2016  . Allergy   . Arthritis   . Carotid stenosis, asymptomatic, bilateral 05/26/2017   Dopplers 02/12/2016: right - mid-distal 40-59% stenosis; left - 40-59% stenosis  . Cataract   . CKD (chronic kidney disease) stage 3, GFR 30-59 ml/min (HCC) 11/02/2017  . Decreased calculated GFR 06/15/2017  . Gallstones 06/22/2017  . History of echocardiogram 11/07/2017   04/09/2016: EF [id number], mild diastolic dysfunction, trivial MR and TR  . Hypertension   . Hypothyroidism   . 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 Rheumatoid arthritis ([place])   . Rheumatoid arthritis involving multiple sites ([place]) 05/26/2017  . Transaminitis 06/15/2017   Normal abdominal US 06/22/17   Past Surgical History: Past Surgical History:  Procedure Laterality Date  . APPENDECTOMY    . CATARACT EXTRACTION, BILATERAL  2015  . 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  . TOTAL KNEE ARTHROPLASTY Right 12/16/2017   Procedure: RIGHT TOTAL KNEE ARTHROPLASTY;  Surgeon: Dorna Leitz, MD;  Location: [facility];  Service: Orthopedics;  Laterality: Right;  Adductor Block   Social History: Social History   Socioeconomic History  . Marital status:  Widowed    Spouse name: Not on file  . Number of children: 0  . Years of education: 16  . Highest education level: Bachelor's degree (e.g., BA, AB, BS)  Occupational History  . Occupation: [employer]    Comment: retired  Scientific laboratory technician  . Financial resource strain: Not hard at all  . Food insecurity    Worry: Never true    Inability: Never true  . Transportation needs    Medical: No    Non-medical: No  Tobacco Use  . Smoking status: Former Smoker    Types: Cigarettes    Quit date: 02/07/1974    Years since quitting: 45.5  . Smokeless tobacco: Never Used  Substance and Sexual Activity  . Alcohol use: Yes    Alcohol/week: 1.0 standard drinks    Types: 1 Glasses of wine per week    Comment: occasionally  . Drug use: No  . Sexual activity: Yes  Lifestyle  . Physical activity    Days per week: 7 days    Minutes per session: 20 min  . Stress: Not at all  Relationships  . Social connections    Talks on phone: More than three times a week    Gets together: Twice a week    Attends religious service: More than 4 times per year    Active member of club or organization: No    Attends meetings of clubs or organizations: Never    Relationship status: Widowed  Other Topics Concern  . Not on file  Social History Narrative  Walks the dog everyday. Runs errands. Caffeine use 2 cups of coffee   Family History: Family History  Problem Relation Age of Onset  . Diabetes Mother   . Lung cancer Father   . Hypertension Father    Allergies: Allergies  Allergen Reactions  . Bee Venom Anaphylaxis  . Methotrexate Derivatives Other (See Comments)    Acute kidney injury  . Oxycodone Other (See Comments)    Adrenal crisis while taking both prednisone and oxycodone   . Prednisone     Adrenal crisis while taking both prednisone and oxycodone   . Verapamil Hives  . Nickel Rash  . Nitrofuran Derivatives Rash   Medications: See med rec.  Review of Systems: No fevers,  chills, night sweats, weight loss, chest pain, or shortness of breath.   Objective:    General: Well Developed, well nourished, and in no acute distress.  Neuro: Alert and oriented x3, extra-ocular muscles intact, sensation grossly intact.  HEENT: Normocephalic, atraumatic Skin: Warm and dry. Cluster of erythematous papules on the left side of forehead. No rash on scalp. Marland Kitchen  Respiratory: Normal work of breathing. Not using accessory muscles, speaking in full sentences.   Impression and Recommendations:   Patient likely has postherpetic neuralgia in the V1 distribution. Patient offered increasing Gabapentin for neuropathic pain but denied this. Will follow up at annual visit next September with Shingrix vaccine.   Postherpetic neuralgia Overall doing well, some left V1 pressure better neuralgia, she does have a couple of tiny pinpoint red lesions but nothing to suggest bacterial superinfection. Symptoms are mild enough where she does not desire any medication changes, reassurance was enough. I think it is okay for her to get her hair colored in a couple of weeks. She can get her Shingrix vaccine next September for her physical exam. Because she is a Medicare patient she will likely need this done at a local pharmacy. Return as needed.   ___________________________________________ Gwen Her. Dianah Field, M.D., ABFM., CAQSM. Primary Care and Sports Medicine [facility]  Adjunct Professor of [employer]

## 2019-08-09 NOTE — Assessment & Plan Note (Signed)
Overall doing well, some left V1 pressure better neuralgia, she does have a couple of tiny pinpoint red lesions but nothing to suggest bacterial superinfection. Symptoms are mild enough where she does not desire any medication changes, reassurance was enough. I think it is okay for her to get her hair colored in a couple of weeks. She can get her Shingrix vaccine next September for her physical exam. Because she is a Medicare patient she will likely need this done at a local pharmacy. Return as needed.

## 2019-08-16 ENCOUNTER — Encounter: Payer: Self-pay | Admitting: Physician Assistant

## 2019-08-28 ENCOUNTER — Other Ambulatory Visit: Payer: Self-pay

## 2019-08-28 DIAGNOSIS — Z20822 Contact with and (suspected) exposure to covid-19: Secondary | ICD-10-CM

## 2019-08-30 LAB — NOVEL CORONAVIRUS, NAA: SARS-CoV-2, NAA: NOT DETECTED

## 2019-09-02 ENCOUNTER — Other Ambulatory Visit: Payer: Self-pay | Admitting: Physician Assistant

## 2019-09-02 DIAGNOSIS — I129 Hypertensive chronic kidney disease with stage 1 through stage 4 chronic kidney disease, or unspecified chronic kidney disease: Secondary | ICD-10-CM

## 2019-09-02 DIAGNOSIS — N183 Chronic kidney disease, stage 3 unspecified: Secondary | ICD-10-CM

## 2019-09-16 ENCOUNTER — Other Ambulatory Visit: Payer: Self-pay | Admitting: Physician Assistant

## 2019-09-17 DIAGNOSIS — E669 Obesity, unspecified: Secondary | ICD-10-CM | POA: Diagnosis not present

## 2019-09-17 DIAGNOSIS — M069 Rheumatoid arthritis, unspecified: Secondary | ICD-10-CM | POA: Diagnosis not present

## 2019-09-17 DIAGNOSIS — M25472 Effusion, left ankle: Secondary | ICD-10-CM | POA: Diagnosis not present

## 2019-09-17 DIAGNOSIS — Z6833 Body mass index (BMI) 33.0-33.9, adult: Secondary | ICD-10-CM | POA: Diagnosis not present

## 2019-09-17 DIAGNOSIS — M150 Primary generalized (osteo)arthritis: Secondary | ICD-10-CM | POA: Diagnosis not present

## 2019-09-17 DIAGNOSIS — B0230 Zoster ocular disease, unspecified: Secondary | ICD-10-CM | POA: Diagnosis not present

## 2019-09-17 DIAGNOSIS — B0223 Postherpetic polyneuropathy: Secondary | ICD-10-CM | POA: Diagnosis not present

## 2019-09-17 DIAGNOSIS — M353 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: Secondary | ICD-10-CM | POA: Diagnosis not present

## 2019-09-17 DIAGNOSIS — R5383 Other fatigue: Secondary | ICD-10-CM | POA: Diagnosis not present

## 2019-09-21 ENCOUNTER — Ambulatory Visit (INDEPENDENT_AMBULATORY_CARE_PROVIDER_SITE_OTHER): Payer: Medicare HMO | Admitting: Osteopathic Medicine

## 2019-09-21 ENCOUNTER — Encounter: Payer: Self-pay | Admitting: Osteopathic Medicine

## 2019-09-21 ENCOUNTER — Other Ambulatory Visit: Payer: Self-pay

## 2019-09-21 VITALS — BP 149/83 | HR 72 | Temp 97.9°F | Wt 212.0 lb

## 2019-09-21 DIAGNOSIS — N183 Chronic kidney disease, stage 3 unspecified: Secondary | ICD-10-CM | POA: Diagnosis not present

## 2019-09-21 DIAGNOSIS — J309 Allergic rhinitis, unspecified: Secondary | ICD-10-CM

## 2019-09-21 DIAGNOSIS — R1114 Bilious vomiting: Secondary | ICD-10-CM

## 2019-09-21 DIAGNOSIS — E039 Hypothyroidism, unspecified: Secondary | ICD-10-CM | POA: Diagnosis not present

## 2019-09-21 DIAGNOSIS — I129 Hypertensive chronic kidney disease with stage 1 through stage 4 chronic kidney disease, or unspecified chronic kidney disease: Secondary | ICD-10-CM | POA: Diagnosis not present

## 2019-09-21 DIAGNOSIS — I6523 Occlusion and stenosis of bilateral carotid arteries: Secondary | ICD-10-CM | POA: Diagnosis not present

## 2019-09-21 DIAGNOSIS — M069 Rheumatoid arthritis, unspecified: Secondary | ICD-10-CM

## 2019-09-21 DIAGNOSIS — M159 Polyosteoarthritis, unspecified: Secondary | ICD-10-CM

## 2019-09-21 DIAGNOSIS — Z91030 Bee allergy status: Secondary | ICD-10-CM | POA: Diagnosis not present

## 2019-09-21 DIAGNOSIS — M150 Primary generalized (osteo)arthritis: Secondary | ICD-10-CM

## 2019-09-21 DIAGNOSIS — M8949 Other hypertrophic osteoarthropathy, multiple sites: Secondary | ICD-10-CM | POA: Diagnosis not present

## 2019-09-21 MED ORDER — LEVOTHYROXINE SODIUM 50 MCG PO TABS: 50.0000 ug | ORAL_TABLET | Freq: Every day | ORAL | 3 refills | Status: DC

## 2019-09-21 MED ORDER — GABAPENTIN 300 MG PO CAPS: 300.0000 mg | ORAL_CAPSULE | Freq: Every day | ORAL | 3 refills | Status: DC

## 2019-09-21 MED ORDER — PREDNISONE 5 MG PO TABS: 5.0000 mg | ORAL_TABLET | Freq: Every day | ORAL | Status: DC

## 2019-09-21 MED ORDER — ATORVASTATIN CALCIUM 40 MG PO TABS: 40.0000 mg | ORAL_TABLET | Freq: Every day | ORAL | 3 refills | Status: DC

## 2019-09-21 MED ORDER — OLOPATADINE HCL 0.2 % OP SOLN: 1.0000 [drp] | Freq: Every day | OPHTHALMIC | Status: DC | PRN

## 2019-09-21 MED ORDER — OMEPRAZOLE 20 MG PO CPDR: 20.0000 mg | DELAYED_RELEASE_CAPSULE | Freq: Two times a day (BID) | ORAL | 1 refills | Status: DC

## 2019-09-21 MED ORDER — FLUTICASONE PROPIONATE 50 MCG/ACT NA SUSP: 2.0000 | Freq: Every day | NASAL | 6 refills | Status: DC

## 2019-09-21 MED ORDER — QUINAPRIL HCL 40 MG PO TABS: 40.0000 mg | ORAL_TABLET | Freq: Every day | ORAL | 3 refills | Status: DC

## 2019-09-21 MED ORDER — AMLODIPINE BESYLATE 5 MG PO TABS: 5.0000 mg | ORAL_TABLET | Freq: Every day | ORAL | 3 refills | Status: DC

## 2019-09-21 MED ORDER — TRAZODONE HCL 100 MG PO TABS: 50.0000 mg | ORAL_TABLET | Freq: Every evening | ORAL | 3 refills | Status: DC | PRN

## 2019-09-21 NOTE — Progress Notes (Signed)
HPI: Melody Moore is a 68 y.o. 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  she presents to [facility] today, 09/21/19,  for chief complaint of:  Follow-up chronic medical issues, see headings below  Hypothyroidism, unspecified type -  Requesting refill, due for thyroid screening  Herpetic neuralgia, primary osteoarthritis involving multiple joints - Patient is no longer taking the gabapentin 100 mg, she is just taking the 300 mg in the evening  Benign hypertension with CKD (chronic kidney disease) stage III -  Blood pressure above goal today, patient has not taken her blood pressure medications today  Allergic rhinitis, unspecified seasonality, unspecified trigger - Requests refill on Flonase  Rheumatoid arthritis -  Following with rheumatology.  Is on chronic low-dose steroids with prednisone at 5 mg.  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 PMH, PSH, FH reviewed and updated as needed.  Current medication list and allergy/intolerance hx reviewed and updated as needed. (See remainder of HPI, ROS, Phys Exam below)        ASSESSMENT/PLAN: The primary encounter diagnosis was Hypothyroidism, unspecified type. Diagnoses of Primary osteoarthritis involving multiple joints,  Benign hypertension with CKD (chronic kidney disease) stage III, Carotid stenosis, asymptomatic, bilateral, Allergic rhinitis, unspecified seasonality, unspecified trigger, Allergy to honey bee venom, Bilious vomiting with nausea, CKD (chronic kidney disease) stage 3, GFR 30-59 ml/min, and Rheumatoid arthritis, involving unspecified site, unspecified whether rheumatoid factor present [facility]) were also pertinent to this visit.    Chronic medical conditions are stable, patient is doing well  Blood pressure above goal today since she did not take medications, patient was advised in the future to take all medications as usual prior to doctor visits.  Allergy list was cleaned up, I do not think that a coadministration of prednisone and oxycodone was what caused her adrenal crisis.  Would need to review records to get details about this hospitalization.  Patient would like labs forwarded to her rheumatology practice, [facility] rheumatology under the care of Marella Chimes, PA-C  Orders Placed This Encounter  Procedures  . CBC  . COMPLETE METABOLIC PANEL WITH GFR  . LIPID SCREENING  . TSH     Meds ordered this encounter  Medications  . amLODipine (NORVASC) 5 MG tablet    Sig: Take 1 tablet (5 mg total) by mouth daily.    Dispense:  90 tablet    Refill:  3  . atorvastatin (LIPITOR) 40 MG tablet    Sig: Take 1 tablet (40 mg total) by mouth daily.    Dispense:  90 tablet    Refill:  3  . fluticasone (FLONASE) 50 MCG/ACT nasal spray    Sig: Place 2 sprays into both nostrils daily.    Dispense:  48 g    Refill:  6  . gabapentin (NEURONTIN) 300 MG capsule    Sig: Take 1 capsule (300 mg total) by mouth at bedtime.    Dispense:  90 capsule    Refill:  3  .  levothyroxine (SYNTHROID) 50 MCG tablet    Sig: Take 1 tablet (50 mcg total) by mouth daily before breakfast.    Dispense:  90 tablet    Refill:  3  . Olopatadine HCl 0.2 % SOLN    Sig: Place 1 drop into both eyes daily as needed (allergies).   Marland Kitchen omeprazole (PRILOSEC) 20 MG capsule    Sig: Take 1 capsule (20 mg total) by mouth 2 (two) times daily before a meal for 14 days.    Dispense:  90 capsule    Refill:  1  . quinapril (ACCUPRIL) 40 MG tablet    Sig: Take 1 tablet (40 mg total) by mouth daily.    Dispense:  90 tablet    Refill:  3  . traZODone (DESYREL) 100 MG tablet    Sig: Take 0.5-1 tablets (50-100 mg total) by mouth at bedtime as needed. for sleep    Dispense:  90 tablet    Refill:  3  . predniSONE (DELTASONE) 5 MG tablet    Sig: Take 1 tablet (5 mg total) by mouth daily with breakfast.    Dispense:        Follow-up plan: Return in about 6 months (around 03/21/2020) for [place], LABS (FASTING), W/ DR A: PHYSICAL & REVIEW LABS 2+ DAYS AFTER [facility].                                                 ################################################# ################################################# ################################################# #################################################    Current Meds  Medication Sig  . acetaminophen (TYLENOL) 650 MG CR tablet Take 2 tablets (1,300 mg total) by mouth 2 (two) times daily as needed for pain.  Marland Kitchen aspirin EC 81 MG tablet Take 81 mg by mouth daily.  . Calcium Carb-Cholecalciferol (CALCIUM 600 + D PO) Take 2 tablets by mouth daily.  . cetirizine (ZYRTEC) 10 MG tablet Take 1 tablet (10 mg total) by mouth daily as needed for allergies.  . Cholecalciferol (VITAMIN D) 2000 units tablet Take 2,000 Units by mouth daily.  . Diclofenac Sodium 2 % SOLN Place 2 sprays onto the skin 2 (two) times daily.  Marland Kitchen docusate sodium (COLACE) 100 MG capsule Take 100 mg by mouth 2 (two) times daily.  Marland Kitchen EPINEPHrine (EPIPEN 2-PAK) 0.3 mg/0.3 mL IJ SOAJ injection Inject 0.3 mLs (0.3 mg total) into the muscle once as needed. (Patient taking differently: Inject 0.3 mg into the muscle once as needed (bee stings). )  . ferrous  sulfate 325 (65 FE) MG tablet Take 325 mg by mouth 2 (two) times daily.  Marland Kitchen gabapentin (NEURONTIN) 100 MG capsule Take 1-3 capsules (100-300 mg total) by mouth 3 (three) times daily.  . hydroxychloroquine (PLAQUENIL) 200 MG tablet Take 200 mg by mouth 2 (two) times daily.   Marland Kitchen leucovorin (WELLCOVORIN) 5 MG tablet Take 5 mg by mouth every Monday, Tuesday, Wednesday, Thursday, and Friday. Except for Saturday/Sunday  . [DISCONTINUED] amLODipine (NORVASC) 5 MG tablet TAKE 1 TABLET BY MOUTH EVERY DAY  . [DISCONTINUED] atorvastatin (LIPITOR) 40 MG tablet TAKE 1 TABLET BY MOUTH EVERYDAY AT BEDTIME  . [DISCONTINUED] fluticasone (FLONASE) 50 MCG/ACT nasal spray USE 1 SPRAY INTO EACH NOSTRIL EVERY DAY  . [DISCONTINUED] gabapentin (NEURONTIN) 300 MG capsule TAKE 1 CAPSULE BY MOUTH EVERYDAY AT BEDTIME  . [DISCONTINUED] leflunomide (ARAVA) 20 MG tablet   . [DISCONTINUED] levothyroxine (  SYNTHROID) 50 MCG tablet Take 1 tablet (50 mcg total) by mouth daily before breakfast.  . [DISCONTINUED] Olopatadine HCl 0.2 % SOLN Place 1 drop into both eyes daily as needed (allergies).  . [DISCONTINUED] quinapril (ACCUPRIL) 40 MG tablet TAKE 1 TABLET BY MOUTH EVERY DAY  . [DISCONTINUED] traZODone (DESYREL) 100 MG tablet TAKE 1/2 TO 1 TABLET BY MOUTH AT BEDTIME AS NEEDED FOR SLEEP    Allergies  Allergen Reactions  . Bee Venom Anaphylaxis  . Methotrexate Derivatives Other (See Comments)    Acute kidney injury  . Verapamil Hives  . Nickel Rash  . Nitrofuran Derivatives Rash       Review of Systems:  Constitutional: No recent illness  HEENT: No  headache, no vision change  Cardiac: No  chest pain, No  pressure, No palpitations  Respiratory:  No  shortness of breath. No  Cough  Musculoskeletal: No new myalgia/arthralgia  Skin: No  Rash  Neurologic: No  weakness, No  Dizziness   Exam:  BP (!) 149/83 (BP Location: Right Arm, Patient Position: Sitting, Cuff Size: Large)   Pulse 72   Temp 97.9 F (36.6 C)  (Oral)   Wt 212 lb 0.6 oz (96.2 kg)   BMI 37.56 kg/m   Constitutional: VS see above. General Appearance: alert, well-developed, well-nourished, NAD  Neck: No masses, trachea midline.   Respiratory: Normal respiratory effort. no wheeze, no rhonchi, no rales  Cardiovascular: S1/S2 normal, no murmur, no rub/gallop auscultated. RRR.   Musculoskeletal: Gait normal. Symmetric and independent movement of all extremities  Neurological: Normal balance/coordination. No tremor.  Skin: warm, dry, intact.   Psychiatric: Normal judgment/insight. Normal mood and affect. Oriented x3.       Visit summary with medication list and pertinent instructions was printed for patient to review, patient was advised to alert [place] if any updates are needed. All questions at time of visit were answered - patient instructed to contact office with any additional concerns. ER/RTC precautions were reviewed with the patient and understanding verbalized.   Note: Total time spent 25 minutes, greater than 50% of the visit was spent face-to-face counseling and coordinating care for the following: The primary encounter diagnosis was Hypothyroidism, unspecified type. Diagnoses of Primary osteoarthritis involving multiple joints, Benign hypertension with CKD (chronic kidney disease) stage III, Carotid stenosis, asymptomatic, bilateral, Allergic rhinitis, unspecified seasonality, unspecified trigger, Allergy to honey bee venom, Bilious vomiting with nausea, CKD (chronic kidney disease) stage 3, GFR 30-59 ml/min, and Rheumatoid arthritis, involving unspecified site, unspecified whether rheumatoid factor present [facility]) were also pertinent to this visit.Marland Kitchen  Please note: voice recognition software was used to produce this document, and typos may escape review. Please contact Dr. Sheppard Coil for any needed clarifications.    Follow up plan: Return in about 6 months (around 03/21/2020) for [place], LABS (FASTING), W/ DR A:  PHYSICAL & REVIEW LABS 2+ DAYS AFTER [facility].

## 2019-09-22 LAB — COMPLETE METABOLIC PANEL WITH GFR
AG Ratio: 1.9 (calc) (ref 1.0–2.5)
ALT: 18 U/L (ref 6–29)
AST: 24 U/L (ref 10–35)
Albumin: 4.3 g/dL (ref 3.6–5.1)
Alkaline phosphatase (APISO): 70 U/L (ref 37–153)
BUN/Creatinine Ratio: 14 (calc) (ref 6–22)
BUN: 18 mg/dL (ref 7–25)
CO2: 26 mmol/L (ref 20–32)
Calcium: 9.6 mg/dL (ref 8.6–10.4)
Chloride: 104 mmol/L (ref 98–110)
Creat: 1.31 mg/dL — ABNORMAL HIGH (ref 0.60–0.93)
GFR, Est African American: 48 mL/min/{1.73_m2} — ABNORMAL LOW (ref >=60)
GFR, Est Non African American: 41 mL/min/{1.73_m2} — ABNORMAL LOW (ref >=60)
Globulin: 2.3 g/dL (calc) (ref 1.9–3.7)
Glucose, Bld: 85 mg/dL (ref 65–99)
Potassium: 4.3 mmol/L (ref 3.5–5.3)
Sodium: 141 mmol/L (ref 135–146)
Total Bilirubin: 0.5 mg/dL (ref 0.2–1.2)
Total Protein: 6.6 g/dL (ref 6.1–8.1)

## 2019-09-22 LAB — CBC
HCT: 40.1 % (ref 35.0–45.0)
Hemoglobin: 13.5 g/dL (ref 11.7–15.5)
MCH: 31.4 pg (ref 27.0–33.0)
MCHC: 33.7 g/dL (ref 32.0–36.0)
MCV: 93.3 fL (ref 80.0–100.0)
MPV: 10.7 fL (ref 7.5–12.5)
Platelets: 253 10*3/uL (ref 140–400)
RBC: 4.3 10*6/uL (ref 3.80–5.10)
RDW: 11.8 % (ref 11.0–15.0)
WBC: 8.8 10*3/uL (ref 3.8–10.8)

## 2019-09-22 LAB — LIPID PANEL
Cholesterol: 209 mg/dL — ABNORMAL HIGH (ref <200)
HDL: 119 mg/dL (ref >=50)
LDL Cholesterol (Calc): 74 mg/dL (calc)
Non-HDL Cholesterol (Calc): 90 mg/dL (calc) (ref <130)
Total CHOL/HDL Ratio: 1.8 (calc) (ref <5.0)
Triglycerides: 75 mg/dL (ref <150)

## 2019-09-22 LAB — TSH: TSH: 1.49 mIU/L (ref 0.40–4.50)

## 2019-10-13 IMAGING — MG DIGITAL SCREENING BILATERAL MAMMOGRAM WITH TOMO AND CAD
6 of 12 series · 6 of 36 positions shown · non-contrast
Comparison: Previous exam(s).

CLINICAL DATA: Screening.

EXAM:
DIGITAL SCREENING BILATERAL MAMMOGRAM WITH TOMO AND CAD

[R CC synth-2D]
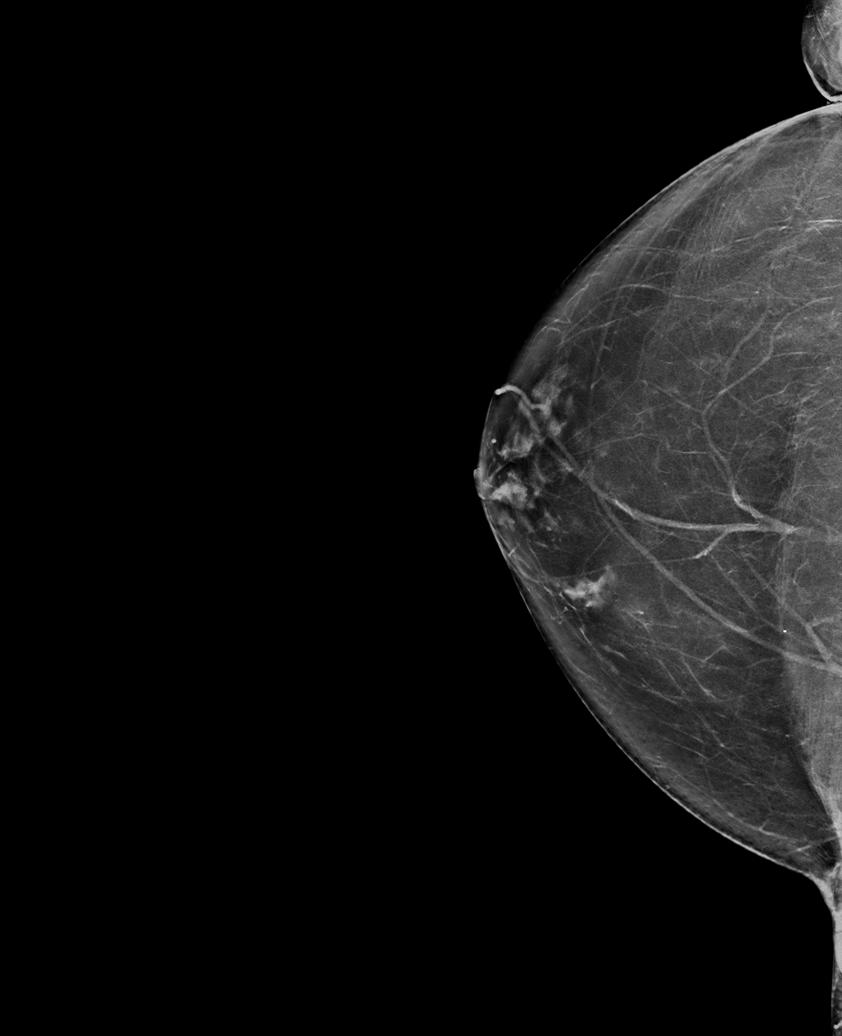

[L CC synth-2D]
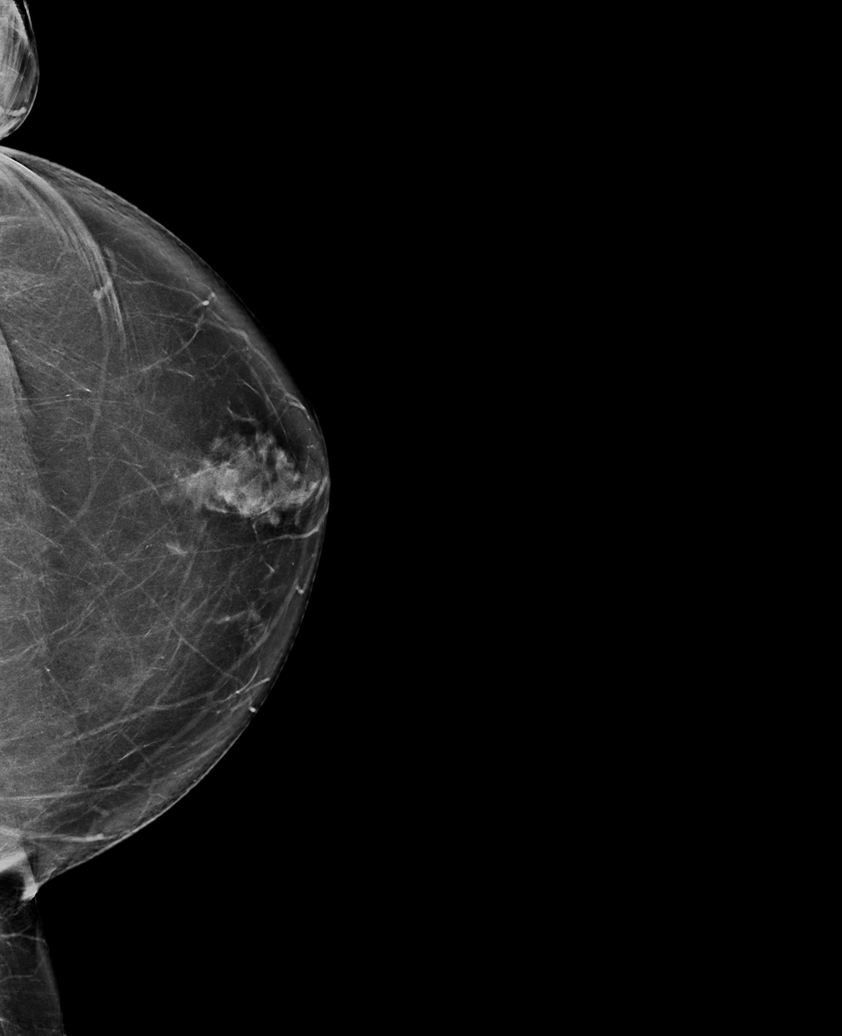

[L MLO synth-2D (1 of 2)]
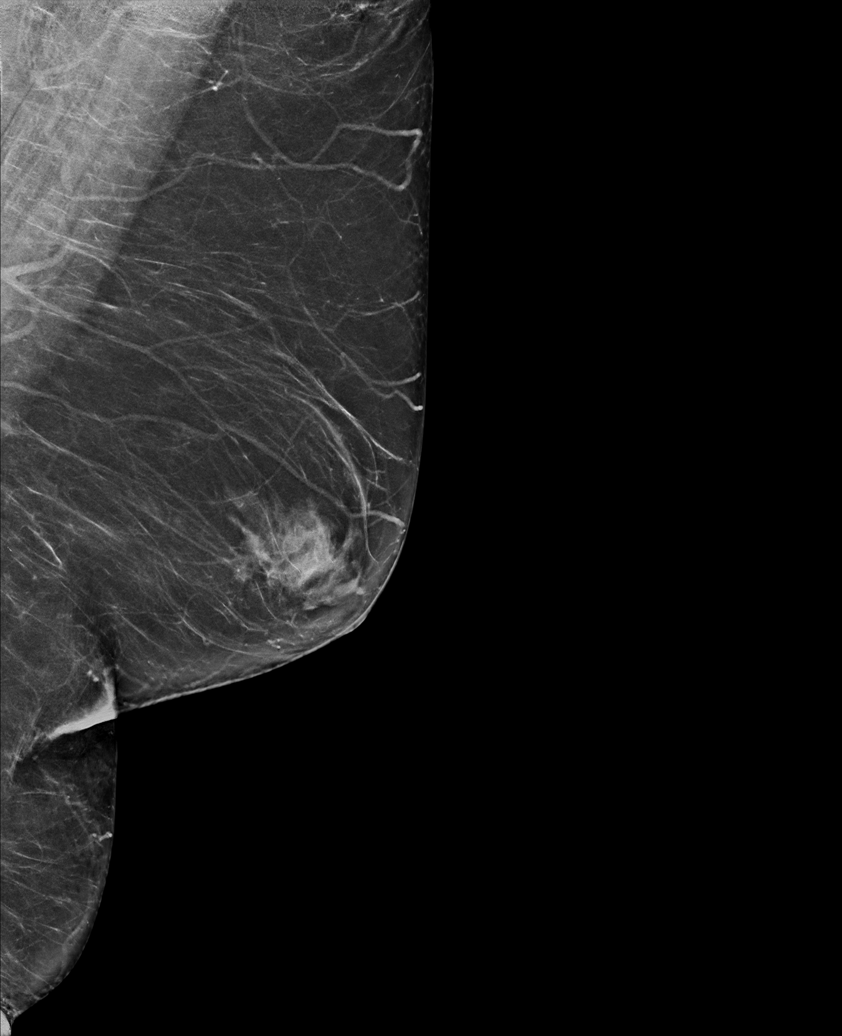

[L MLO synth-2D (2 of 2)]
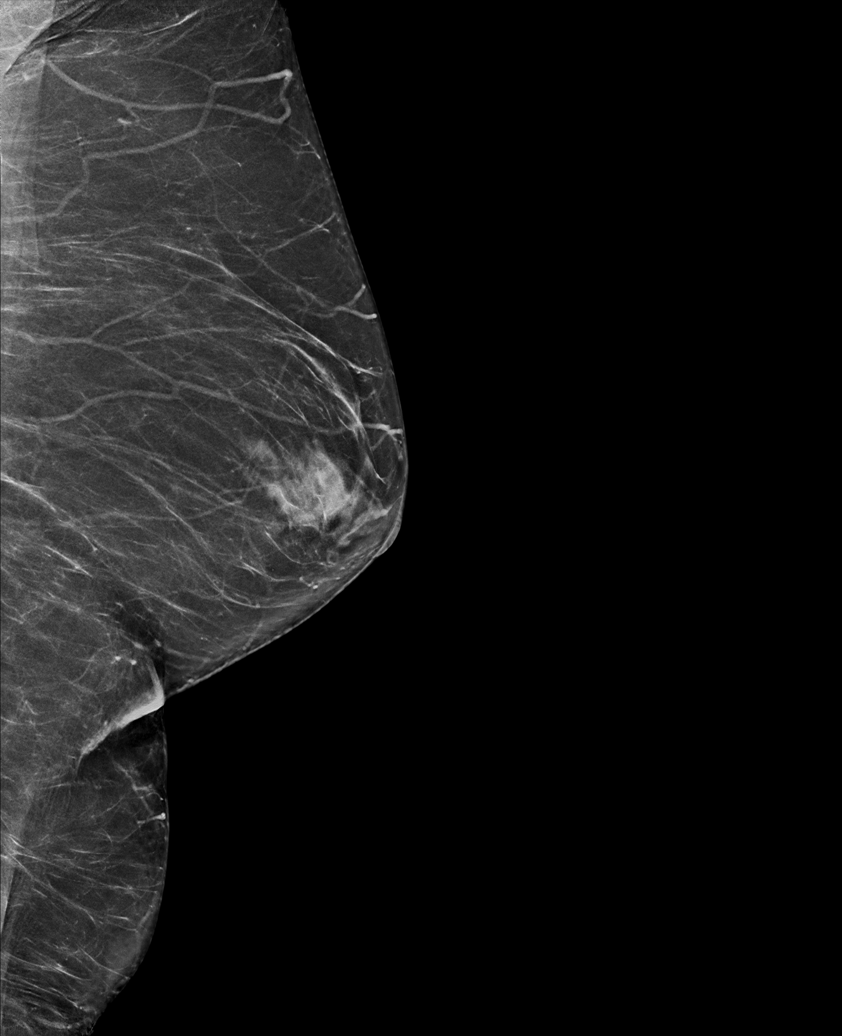

[R MLO synth-2D]
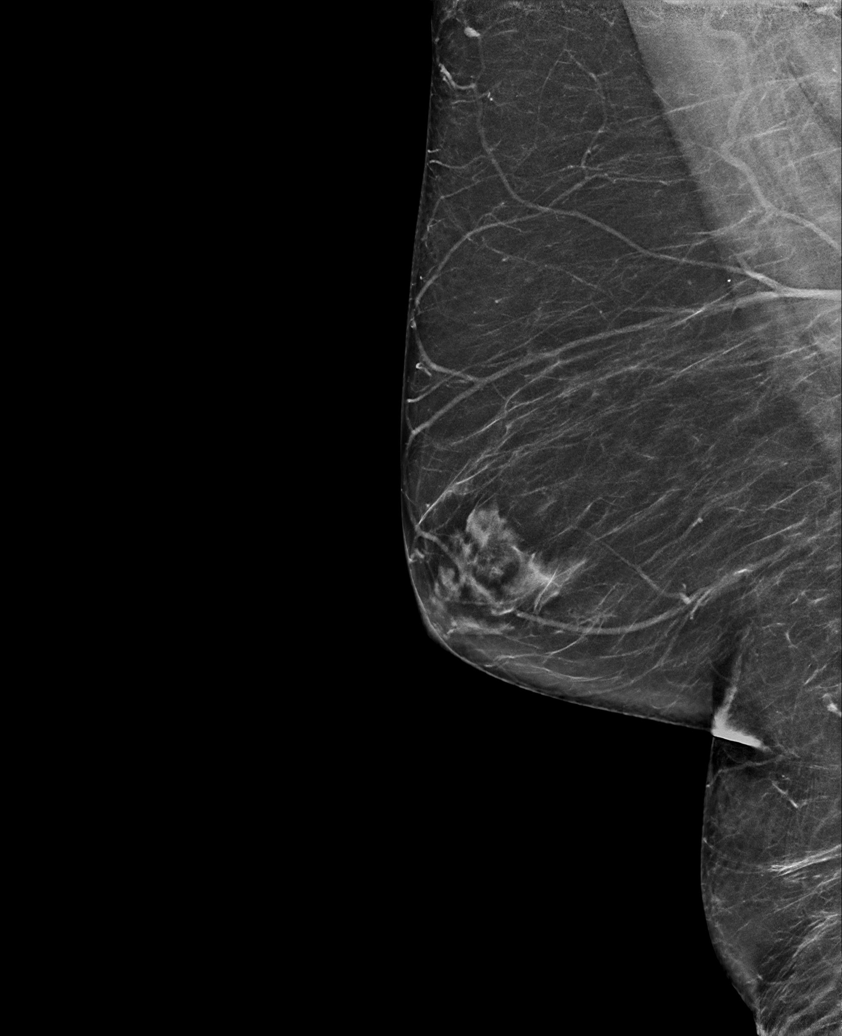

[R CV synth-2D]
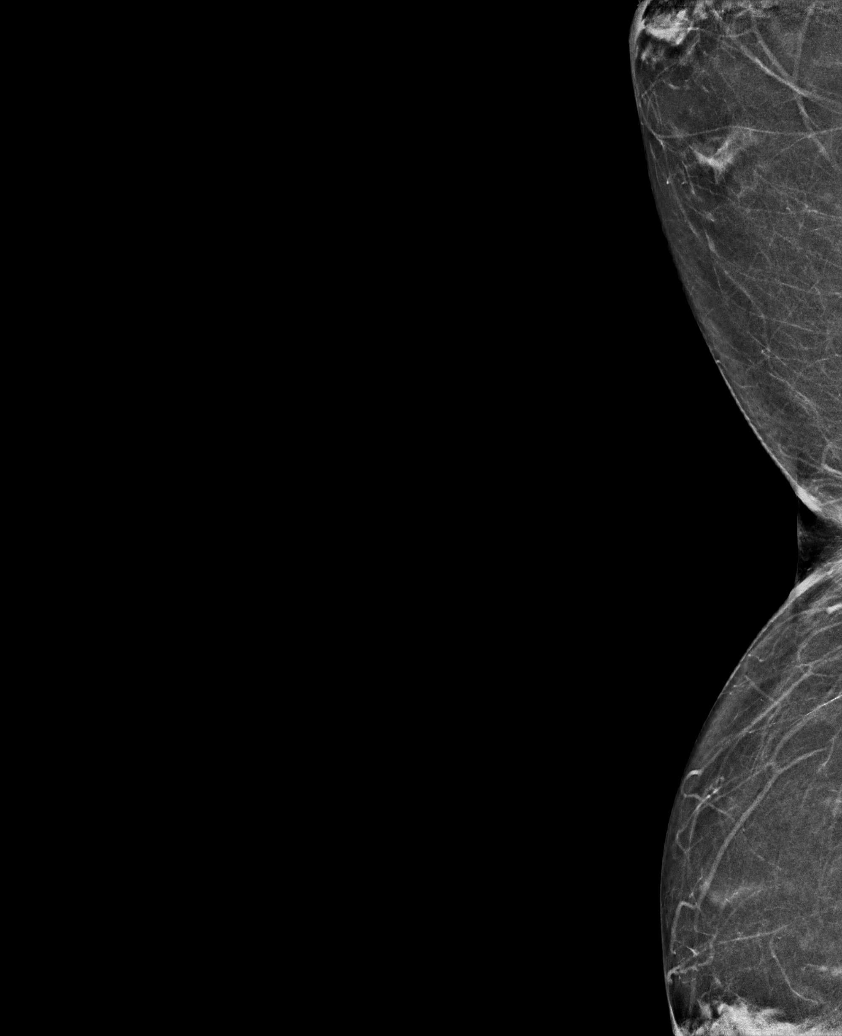

[6 of 36 positions shown; findings below may reference images not displayed]

ACR Breast Density Category b: There are scattered areas of
fibroglandular density.
FINDINGS: There are no findings suspicious for malignancy. Images were
processed with CAD.
IMPRESSION: No mammographic evidence of malignancy. A result letter of this
screening mammogram will be mailed directly to the patient.

RECOMMENDATION:
Screening mammogram in one year. (Code:[id number])

BI-RADS CATEGORY  1: Negative.

## 2019-10-23 ENCOUNTER — Other Ambulatory Visit: Payer: Self-pay | Admitting: Osteopathic Medicine

## 2019-10-23 NOTE — Telephone Encounter (Signed)
As per [facility] " insurance will not pay for trazodone at 100 mg. Requesting rx switch to 50 mg". Routing to provider for review.

## 2019-12-03 ENCOUNTER — Ambulatory Visit: Payer: Medicare HMO | Attending: Internal Medicine

## 2019-12-03 DIAGNOSIS — Z23 Encounter for immunization: Secondary | ICD-10-CM

## 2019-12-03 NOTE — Progress Notes (Signed)
   Covid-19 Vaccination Clinic  Name:  [name]    MRN: [id number] DOB: 03/09/1951  12/03/2019  [name] was observed post Covid-19 immunization for 15 minutes without incidence. She was provided with Vaccine Information Sheet and instruction to access the V-Safe system.   Ms. Bacon was instructed to call 911 with any severe reactions post vaccine: Marland Kitchen Difficulty breathing  . Swelling of your face and throat  . A fast heartbeat  . A bad rash all over your body  . Dizziness and weakness    Immunizations Administered    Name Date Dose VIS Date Route   Pfizer COVID-19 Vaccine 12/03/2019  8:22 AM 0.3 mL 09/14/2019 Intramuscular   Manufacturer: [place]   Lot: [id number]   [place]: [id number]

## 2019-12-17 DIAGNOSIS — E669 Obesity, unspecified: Secondary | ICD-10-CM | POA: Diagnosis not present

## 2019-12-17 DIAGNOSIS — M150 Primary generalized (osteo)arthritis: Secondary | ICD-10-CM | POA: Diagnosis not present

## 2019-12-17 DIAGNOSIS — M069 Rheumatoid arthritis, unspecified: Secondary | ICD-10-CM | POA: Diagnosis not present

## 2019-12-17 DIAGNOSIS — Z7189 Other specified counseling: Secondary | ICD-10-CM | POA: Diagnosis not present

## 2019-12-17 DIAGNOSIS — R5383 Other fatigue: Secondary | ICD-10-CM | POA: Diagnosis not present

## 2019-12-17 DIAGNOSIS — B0230 Zoster ocular disease, unspecified: Secondary | ICD-10-CM | POA: Diagnosis not present

## 2019-12-17 DIAGNOSIS — M25472 Effusion, left ankle: Secondary | ICD-10-CM | POA: Diagnosis not present

## 2019-12-17 DIAGNOSIS — B0223 Postherpetic polyneuropathy: Secondary | ICD-10-CM | POA: Diagnosis not present

## 2019-12-17 DIAGNOSIS — M353 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: Secondary | ICD-10-CM | POA: Diagnosis not present

## 2019-12-18 NOTE — Progress Notes (Deleted)
Subjective:   Melody Moore is a 69 y.o. female who presents for Medicare Annual (Subsequent) preventive examination.  Review of Systems:  No ROS.  Medicare Wellness Virtual Visit.  Visual/audio telehealth visit, UTA vital signs.   See social history for additional risk factors.      Sleep patterns:    Home Safety/Smoke Alarms: Feels safe in home. Smoke alarms in place.  Living environment; Seat Belt Safety/Bike Helmet: Wears seat belt.   Female:   Pap- Aged out      Mammo- UTD       Dexa scan-        CCS- UTD     Objective:     Vitals: There were no vitals taken for this visit.  There is no height or weight on file to calculate BMI.  Advanced Directives 12/19/2018 06/09/2018 01/09/2018 12/23/2017 12/16/2017 12/16/2017 12/09/2017  Does Patient Have a Medical Advance Directive? Yes Yes Yes Yes No No No  Type of Paramedic of [place];Living will [place];Living will [place];Living will [place] - - -  Does patient want to make changes to medical advance directive? No - Patient declined - - No - Patient declined - - -  Copy of [place] in Chart? Yes - validated most recent copy scanned in chart (See row information) Yes Yes No - copy requested - - -  Would patient like information on creating a medical advance directive? - - - - No - Patient declined No - Patient declined No - Patient declined    Tobacco Social History   Tobacco Use  Smoking Status Former Smoker  . Types: Cigarettes  . Quit date: 02/07/1974  . Years since quitting: 45.8  Smokeless Tobacco Never Used     Counseling given: Not Answered   Clinical Intake:                       Past Medical History:  Diagnosis Date  . 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 AKI (acute kidney injury) ([place]) 02/2016  . Allergy   . Arthritis   . Carotid stenosis, asymptomatic, bilateral 05/26/2017   Dopplers  02/12/2016: right - mid-distal 40-59% stenosis; left - 40-59% stenosis  . Cataract   . CKD (chronic kidney disease) stage 3, GFR 30-59 ml/min 11/02/2017  . Decreased calculated GFR 06/15/2017  . Gallstones 06/22/2017  . History of echocardiogram 11/07/2017   04/09/2016: EF [id number], mild diastolic dysfunction, trivial MR and TR  . Hypertension   . Hypothyroidism   . 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 Rheumatoid arthritis ([place])   . Rheumatoid arthritis involving multiple sites ([place]) 05/26/2017  . Transaminitis 06/15/2017   Normal abdominal US 06/22/17   Past Surgical History:  Procedure Laterality Date  . APPENDECTOMY    . CATARACT EXTRACTION, BILATERAL  2015  . 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  . TOTAL KNEE ARTHROPLASTY Right 12/16/2017   Procedure: RIGHT TOTAL KNEE ARTHROPLASTY;  Surgeon: Dorna Leitz, MD;  Location: [facility] ORS;  Service: Orthopedics;  Laterality: Right;  Adductor Block   Family History  Problem Relation Age of Onset  . Diabetes Mother   . Lung cancer Father   . Hypertension Father    Social History   Socioeconomic History  . Marital status: Widowed    Spouse name: Not on file  . Number of children: 0  . Years of education: 16  . Highest education level:  Bachelor's degree (e.g., BA, AB, BS)  Occupational History  . Occupation: [employer]    Comment: retired  Tobacco Use  . Smoking status: Former Smoker    Types: Cigarettes    Quit date: 02/07/1974    Years since quitting: 45.8  . Smokeless tobacco: Never Used  Substance and Sexual Activity  . Alcohol use: Yes    Alcohol/week: 1.0 standard drinks    Types: 1 Glasses of wine per week    Comment: occasionally  . Drug use: No  . Sexual activity: Yes  Other Topics Concern  . Not on file  Social History Narrative   Walks the dog everyday. Runs errands. Caffeine use 2 cups of coffee   Social Determinants of Health   Financial Resource Strain: Low Risk   . Difficulty of Paying  Living Expenses: Not hard at all  Food Insecurity: No Food Insecurity  . Worried About Charity fundraiser in the Last Year: Never true  . Ran Out of Food in the Last Year: Never true  Transportation Needs: No Transportation Needs  . Lack of Transportation (Medical): No  . Lack of Transportation (Non-Medical): No  Physical Activity: Insufficiently Active  . Days of Exercise per Week: 7 days  . Minutes of Exercise per Session: 20 min  Stress: No Stress Concern Present  . Feeling of Stress : Not at all  Social Connections: Somewhat Isolated  . Frequency of Communication with Friends and Family: More than three times a week  . Frequency of Social Gatherings with Friends and Family: Twice a week  . Attends Religious Services: More than 4 times per year  . Active Member of Clubs or Organizations: No  . Attends Archivist Meetings: Never  . Marital Status: Widowed    Outpatient Encounter Medications as of 12/24/2019  Medication Sig  . acetaminophen (TYLENOL) 650 MG CR tablet Take 2 tablets (1,300 mg total) by mouth 2 (two) times daily as needed for pain.  Marland Kitchen amLODipine (NORVASC) 5 MG tablet Take 1 tablet (5 mg total) by mouth daily.  Marland Kitchen aspirin EC 81 MG tablet Take 81 mg by mouth daily.  Marland Kitchen atorvastatin (LIPITOR) 40 MG tablet Take 1 tablet (40 mg total) by mouth daily.  . Calcium Carb-Cholecalciferol (CALCIUM 600 + D PO) Take 2 tablets by mouth daily.  . cetirizine (ZYRTEC) 10 MG tablet Take 1 tablet (10 mg total) by mouth daily as needed for allergies.  . Cholecalciferol (VITAMIN D) 2000 units tablet Take 2,000 Units by mouth daily.  . Diclofenac Sodium 2 % SOLN Place 2 sprays onto the skin 2 (two) times daily.  Marland Kitchen docusate sodium (COLACE) 100 MG capsule Take 100 mg by mouth 2 (two) times daily.  Marland Kitchen EPINEPHrine (EPIPEN 2-PAK) 0.3 mg/0.3 mL IJ SOAJ injection Inject 0.3 mLs (0.3 mg total) into the muscle once as needed. (Patient taking differently: Inject 0.3 mg into the muscle once as  needed (bee stings). )  . ferrous sulfate 325 (65 FE) MG tablet Take 325 mg by mouth 2 (two) times daily.  . fluticasone (FLONASE) 50 MCG/ACT nasal spray Place 2 sprays into both nostrils daily.  Marland Kitchen gabapentin (NEURONTIN) 100 MG capsule Take 1-3 capsules (100-300 mg total) by mouth 3 (three) times daily.  Marland Kitchen gabapentin (NEURONTIN) 300 MG capsule Take 1 capsule (300 mg total) by mouth at bedtime.  . hydroxychloroquine (PLAQUENIL) 200 MG tablet Take 200 mg by mouth 2 (two) times daily.   Marland Kitchen leucovorin (WELLCOVORIN) 5 MG  tablet Take 5 mg by mouth every Monday, Tuesday, Wednesday, Thursday, and Friday. Except for Saturday/Sunday  . levothyroxine (SYNTHROID) 50 MCG tablet Take 1 tablet (50 mcg total) by mouth daily before breakfast.  . Olopatadine HCl 0.2 % SOLN Place 1 drop into both eyes daily as needed (allergies).  Marland Kitchen omeprazole (PRILOSEC) 20 MG capsule Take 1 capsule (20 mg total) by mouth 2 (two) times daily before a meal for 14 days.  . predniSONE (DELTASONE) 5 MG tablet Take 1 tablet (5 mg total) by mouth daily with breakfast.  . quinapril (ACCUPRIL) 40 MG tablet Take 1 tablet (40 mg total) by mouth daily.  . traZODone (DESYREL) 50 MG tablet TAKE 1 TO 2 TABLETS BY MOUTH AT BEDTIME AS NEEDED   No facility-administered encounter medications on file as of 12/24/2019.    Activities of Daily Living In your present state of health, do you have any difficulty performing the following activities: 12/19/2018  Hearing? N  Vision? N  Comment Has appointment with eye doctor coming up in May  Difficulty concentrating or making decisions? N  Walking or climbing stairs? N  Dressing or bathing? N  Doing errands, shopping? N  Preparing Food and eating ? N  Using the Toilet? N  In the past six months, have you accidently leaked urine? N  Do you have problems with loss of bowel control? N  Managing your Medications? N  Managing your Finances? N  Housekeeping or managing your Housekeeping? N  Some recent  data might be hidden    Patient Care Team: Emeterio Reeve, DO as PCP - General (Osteopathic Medicine) Rosita Kea, PA-C as Consulting Physician (Rheumatology)    Assessment:   This is a routine wellness examination for [place].Physical assessment deferred to PCP.   Exercise Activities and Dietary recommendations   Diet  Breakfast: Lunch:  Dinner:       Goals    . DIET - INCREASE WATER INTAKE     Increase water intake to 64 ounces a day.    . Weight (lb) < 200 lb (90.7 kg)     Patient states would like to loose at least 10  Lbs in 2020.       Fall Risk Fall Risk  01/11/2019 12/19/2018 11/25/2017 05/26/2017  Falls in the past year? - 0 Yes No  Number falls in past yr: - - 1 -  Injury with Fall? - - No -  Risk for fall due to : - - Impaired mobility -  Risk for fall due to: Comment - - knee OA -  Follow up Education provided Falls prevention discussed Education provided;Falls prevention discussed -   Is the patient's home free of loose throw rugs in walkways, pet beds, electrical cords, etc?   {Blank single:19197::"yes","no"}      Grab bars in the bathroom? {Blank single:19197::"yes","no"}      Handrails on the stairs?   {Blank single:19197::"yes","no"}      Adequate lighting?   {Blank single:19197::"yes","no"}   Depression Screen PHQ 2/9 Scores 06/22/2019 12/19/2018 05/24/2018 11/25/2017  PHQ - 2 Score 1 0 0 3  PHQ- 9 Score 4 - - 5     Cognitive Function     6CIT Screen 12/19/2018  What Year? 0 points  What month? 0 points  What time? 0 points  Count back from 20 2 points  Months in reverse 0 points  Repeat phrase 0 points  Total Score 2    Immunization History  Administered Date(s) Administered  .  Influenza, High Dose Seasonal PF 05/26/2017  . PFIZER SARS-COV-2 Vaccination 12/03/2019    Screening Tests Health Maintenance  Topic Date Due  . Hepatitis C Screening  Never done  . DEXA SCAN  Never done  . INFLUENZA VACCINE  01/02/2020 (Originally  05/05/2019)  . MAMMOGRAM  06/14/2020  . TETANUS/TDAP  05/26/2022  . COLONOSCOPY  04/04/2027  . PNA vac Low Risk Adult  Completed       Plan:   ***   I have personally reviewed and noted the following in the patient's chart:   . Medical and social history . Use of alcohol, tobacco or illicit drugs  . Current medications and supplements . Functional ability and status . Nutritional status . Physical activity . Advanced directives . List of other physicians . Hospitalizations, surgeries, and ER visits in previous 12 months . Vitals . Screenings to include cognitive, depression, and falls . Referrals and appointments  In addition, I have reviewed and discussed with patient certain preventive protocols, quality metrics, and best practice recommendations. A written personalized care plan for preventive services as well as general preventive health recommendations were provided to patient.     Joanne Chars, LPN  [id number]

## 2019-12-24 ENCOUNTER — Ambulatory Visit: Payer: Medicare HMO

## 2020-01-01 ENCOUNTER — Ambulatory Visit: Payer: Medicare HMO | Attending: Internal Medicine

## 2020-01-01 DIAGNOSIS — Z23 Encounter for immunization: Secondary | ICD-10-CM

## 2020-01-01 NOTE — Progress Notes (Signed)
   Covid-19 Vaccination Clinic  Name:  Melody Moore    MRN: [id number] DOB: July 01, 1951  01/01/2020  [name] was observed post Covid-19 immunization for 15 minutes without incident. She was provided with Vaccine Information Sheet and instruction to access the V-Safe system.   [name] was instructed to call 911 with any severe reactions post vaccine: Marland Kitchen Difficulty breathing  . Swelling of face and throat  . A fast heartbeat  . A bad rash all over body  . Dizziness and weakness   Immunizations Administered    Name Date Dose VIS Date Route   Pfizer COVID-19 Vaccine 01/01/2020  8:34 AM 0.3 mL 09/14/2019 Intramuscular   Manufacturer: [place]   Lot: [id number]   [place]: [id number]

## 2020-01-20 ENCOUNTER — Other Ambulatory Visit: Payer: Self-pay | Admitting: Osteopathic Medicine

## 2020-01-31 ENCOUNTER — Encounter: Payer: Self-pay | Admitting: Osteopathic Medicine

## 2020-03-18 ENCOUNTER — Other Ambulatory Visit: Payer: Self-pay | Admitting: Osteopathic Medicine

## 2020-03-19 DIAGNOSIS — M353 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: Secondary | ICD-10-CM | POA: Diagnosis not present

## 2020-03-19 DIAGNOSIS — B0223 Postherpetic polyneuropathy: Secondary | ICD-10-CM | POA: Diagnosis not present

## 2020-03-19 DIAGNOSIS — M150 Primary generalized (osteo)arthritis: Secondary | ICD-10-CM | POA: Diagnosis not present

## 2020-03-19 DIAGNOSIS — Z6833 Body mass index (BMI) 33.0-33.9, adult: Secondary | ICD-10-CM | POA: Diagnosis not present

## 2020-03-19 DIAGNOSIS — B0230 Zoster ocular disease, unspecified: Secondary | ICD-10-CM | POA: Diagnosis not present

## 2020-03-19 DIAGNOSIS — Z7189 Other specified counseling: Secondary | ICD-10-CM | POA: Diagnosis not present

## 2020-03-19 DIAGNOSIS — R5383 Other fatigue: Secondary | ICD-10-CM | POA: Diagnosis not present

## 2020-03-19 DIAGNOSIS — M069 Rheumatoid arthritis, unspecified: Secondary | ICD-10-CM | POA: Diagnosis not present

## 2020-03-19 DIAGNOSIS — M25472 Effusion, left ankle: Secondary | ICD-10-CM | POA: Diagnosis not present

## 2020-03-19 LAB — HEMOGLOBIN A1C: Hemoglobin A1C: 12.8

## 2020-03-19 LAB — HEPATIC FUNCTION PANEL
ALT: 16 (ref 7–35)
AST: 25 (ref 13–35)
Alkaline Phosphatase: 78 (ref 25–125)

## 2020-03-19 LAB — BASIC METABOLIC PANEL
Chloride: 102 (ref 99–108)
Creatinine: 1.4 — AB (ref 0.5–1.1)
Potassium: 4.7 (ref 3.4–5.3)
Sodium: 140 (ref 137–147)

## 2020-03-19 LAB — COMPREHENSIVE METABOLIC PANEL
Calcium: 9.4 (ref 8.7–10.7)
GFR calc non Af Amer: 39

## 2020-03-19 LAB — CBC AND DIFFERENTIAL: WBC: 5.4

## 2020-03-19 LAB — CMP14+EGFR: Protein: 6.5

## 2020-03-24 ENCOUNTER — Encounter: Payer: Self-pay | Admitting: Medical-Surgical

## 2020-03-24 ENCOUNTER — Ambulatory Visit (INDEPENDENT_AMBULATORY_CARE_PROVIDER_SITE_OTHER): Payer: Medicare HMO | Admitting: Medical-Surgical

## 2020-03-24 VITALS — BP 165/79 | HR 67 | Temp 97.8°F | Ht 65.0 in | Wt 204.6 lb

## 2020-03-24 DIAGNOSIS — Z1159 Encounter for screening for other viral diseases: Secondary | ICD-10-CM

## 2020-03-24 DIAGNOSIS — Z1231 Encounter for screening mammogram for malignant neoplasm of breast: Secondary | ICD-10-CM | POA: Diagnosis not present

## 2020-03-24 DIAGNOSIS — Z91030 Bee allergy status: Secondary | ICD-10-CM

## 2020-03-24 DIAGNOSIS — Z13820 Encounter for screening for osteoporosis: Secondary | ICD-10-CM | POA: Diagnosis not present

## 2020-03-24 DIAGNOSIS — Z0000 Encounter for general adult medical examination without abnormal findings: Secondary | ICD-10-CM

## 2020-03-24 MED ORDER — EPINEPHRINE 0.3 MG/0.3ML IJ SOAJ: 0.3000 mg | Freq: Once | INTRAMUSCULAR | 3 refills | Status: DC | PRN

## 2020-03-24 NOTE — Progress Notes (Signed)
Subjective:   Melody Moore is a 69 y.o. female who presents for Medicare Annual (Subsequent) preventive examination.  Review of Systems    No recent illness, fever, chills, unexplained fatigue, and unexpected weight changes. Always cold but that is normal. No night sweats, chest pain, shortness of breath, lower extremity edema, palpitations, headaches, dizziness, weakness, and SI/HI.  No complaints or concerns today.       Objective:    Today's Vitals   03/24/20 0945  BP: (!) 165/79  Pulse: 67  Temp: 97.8 F (36.6 C)  TempSrc: Oral  SpO2: 97%  Weight: 204 lb 9.6 oz (92.8 kg)  Height: 5\' 5"  (1.651 m)   Body mass index is 34.05 kg/m.  Advanced Directives 03/24/2020 12/19/2018 06/09/2018 01/09/2018 12/23/2017 12/16/2017 12/16/2017  Does Patient Have a Medical Advance Directive? Yes Yes Yes Yes Yes No No  Type of Paramedic of [place];Living will [place];Living will [place];Living will [place] - -  Does patient want to make changes to medical advance directive? Yes (MAU/Ambulatory/Procedural Areas - Information given) No - Patient declined - - No - Patient declined - -  Copy of [place] in Chart? Yes - validated most recent copy scanned in chart (See row information) Yes - validated most recent copy scanned in chart (See row information) Yes Yes No - copy requested - -  Would patient like information on creating a medical advance directive? - - - - - No - Patient declined No - Patient declined    Current Medications (verified) Outpatient Encounter Medications as of 03/24/2020  Medication Sig  . acetaminophen (TYLENOL) 650 MG CR tablet Take 2 tablets (1,300 mg total) by mouth 2 (two) times daily as needed for pain.  Marland Kitchen amLODipine (NORVASC) 5 MG tablet Take 1 tablet (5 mg total) by mouth daily.  Marland Kitchen aspirin EC 81 MG tablet Take 81 mg by mouth daily.  Marland Kitchen  atorvastatin (LIPITOR) 40 MG tablet Take 1 tablet (40 mg total) by mouth daily.  . Calcium Carb-Cholecalciferol (CALCIUM 600 + D PO) Take 2 tablets by mouth daily.  . Cholecalciferol (VITAMIN D) 2000 units tablet Take 2,000 Units by mouth daily.  Marland Kitchen docusate sodium (COLACE) 100 MG capsule Take 100 mg by mouth 2 (two) times daily.  Marland Kitchen EPINEPHrine (EPIPEN 2-PAK) 0.3 mg/0.3 mL IJ SOAJ injection Inject 0.3 mLs (0.3 mg total) into the muscle once as needed. (Patient taking differently: Inject 0.3 mg into the muscle once as needed (bee stings). )  . ferrous sulfate 325 (65 FE) MG tablet Take 325 mg by mouth 2 (two) times daily.  . fluticasone (FLONASE) 50 MCG/ACT nasal spray Place 2 sprays into both nostrils daily.  . hydroxychloroquine (PLAQUENIL) 200 MG tablet Take 200 mg by mouth 2 (two) times daily.   Marland Kitchen leflunomide (ARAVA) 20 MG tablet Take 1 tablet by mouth daily.  Marland Kitchen leucovorin (WELLCOVORIN) 5 MG tablet Take 5 mg by mouth daily.   Marland Kitchen levothyroxine (SYNTHROID) 50 MCG tablet Take 1 tablet (50 mcg total) by mouth daily before breakfast.  . quinapril (ACCUPRIL) 40 MG tablet Take 1 tablet (40 mg total) by mouth daily.  . traZODone (DESYREL) 50 MG tablet TAKE 1-2 TABLETS BY MOUTH AT BEDTIME AS NEEDED  . triamcinolone ointment (KENALOG) 0.5 % Apply 1 application topically daily as needed.  . cetirizine (ZYRTEC) 10 MG tablet Take 1 tablet (10 mg total) by mouth daily as needed  for allergies. (Patient not taking: Reported on 03/24/2020)  . Diclofenac Sodium 2 % SOLN Place 2 sprays onto the skin 2 (two) times daily. (Patient not taking: Reported on 03/24/2020)  . gabapentin (NEURONTIN) 100 MG capsule Take 1-3 capsules (100-300 mg total) by mouth 3 (three) times daily. (Patient not taking: Reported on 03/24/2020)  . gabapentin (NEURONTIN) 300 MG capsule Take 1 capsule (300 mg total) by mouth at bedtime. (Patient not taking: Reported on 03/24/2020)  . Olopatadine HCl 0.2 % SOLN Place 1 drop into both eyes daily as  needed (allergies). (Patient not taking: Reported on 03/24/2020)  . omeprazole (PRILOSEC) 20 MG capsule Take 1 capsule (20 mg total) by mouth 2 (two) times daily before a meal for 14 days. (Patient not taking: Reported on 03/24/2020)  . predniSONE (DELTASONE) 5 MG tablet Take 1 tablet (5 mg total) by mouth daily with breakfast. (Patient not taking: Reported on 03/24/2020)   No facility-administered encounter medications on file as of 03/24/2020.    Allergies (verified) Bee venom, Methotrexate derivatives, Oxycodone, Prednisone, Verapamil, Nickel, and Nitrofuran derivatives   History: Past Medical History:  Diagnosis Date  . 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 AKI (acute kidney injury) ([place]) 02/2016  . Allergy   . Arthritis   . Carotid stenosis, asymptomatic, bilateral 05/26/2017   Dopplers 02/12/2016: right - mid-distal 40-59% stenosis; left - 40-59% stenosis  . Cataract   . CKD (chronic kidney disease) stage 3, GFR 30-59 ml/min 11/02/2017  . Decreased calculated GFR 06/15/2017  . Gallstones 06/22/2017  . History of echocardiogram 11/07/2017   04/09/2016: EF 13%, mild diastolic dysfunction, trivial MR and TR  . Hypertension   . Hypothyroidism   . 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 Rheumatoid arthritis ([place])   . Rheumatoid arthritis involving multiple sites ([place]) 05/26/2017  . Transaminitis 06/15/2017   Normal abdominal US 06/22/17   Past Surgical History:  Procedure Laterality Date  . APPENDECTOMY    . CATARACT EXTRACTION, BILATERAL  2015  . 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  . TOTAL KNEE ARTHROPLASTY Right 12/16/2017   Procedure: RIGHT TOTAL KNEE ARTHROPLASTY;  Surgeon: Dorna Leitz, MD;  Location: [facility];  Service: Orthopedics;  Laterality: Right;  Adductor Block   Family History  Problem Relation Age of Onset  . Diabetes Mother   . Lung cancer Father   . Hypertension Father   . Uterine cancer Sister    Social History   Socioeconomic History  . Marital status:  Widowed    Spouse name: Not on file  . Number of children: 0  . Years of education: 16  . Highest education level: Bachelor's degree (e.g., BA, AB, BS)  Occupational History  . Occupation: [employer]    Comment: retired  Tobacco Use  . Smoking status: Former Smoker    Types: Cigarettes    Quit date: 02/07/1974    Years since quitting: 46.1  . Smokeless tobacco: Never Used  Vaping Use  . Vaping Use: Never used  Substance and Sexual Activity  . Alcohol use: Yes    Comment: occasionally  . Drug use: No  . Sexual activity: Yes    Partners: Male    Birth control/protection: Post-menopausal  Other Topics Concern  . Not on file  Social History Narrative   Walks the dog everyday. Runs errands. Caffeine use 2 cups of coffee   Social Determinants of Health   Financial Resource Strain:   . Difficulty of Paying Living Expenses:   Food  Insecurity:   . Worried About Charity fundraiser in the Last Year:   . Arboriculturist in the Last Year:   Transportation Needs:   . Film/video editor (Medical):   Marland Kitchen Lack of Transportation (Non-Medical):   Physical Activity:   . Days of Exercise per Week:   . Minutes of Exercise per Session:   Stress:   . Feeling of Stress :   Social Connections:   . Frequency of Communication with Friends and Family:   . Frequency of Social Gatherings with Friends and Family:   . Attends Religious Services:   . Active Member of Clubs or Organizations:   . Attends Archivist Meetings:   Marland Kitchen Marital Status:     Tobacco Counseling Counseling given: Not Answered   Clinical Intake:  Pre-visit preparation completed: Yes  Pain : No/denies pain     Nutritional Risks: None Diabetes: No  How often do you need to have someone help you when you read instructions, pamphlets, or other written materials from your doctor or pharmacy?: 1 - Never What is the last grade level you completed in school?: college  Diabetic?  No  Interpreter Needed?: No  Information entered by :: [place], CMA   Activities of Daily Living In your present state of health, do you have any difficulty performing the following activities: 03/24/2020  Hearing? N  Vision? N  Comment is scheduled to see Opthalmologist in 3 months  Difficulty concentrating or making decisions? N  Walking or climbing stairs? N  Dressing or bathing? N  Doing errands, shopping? N  Preparing Food and eating ? N  Using the Toilet? N  In the past six months, have you accidently leaked urine? N  Do you have problems with loss of bowel control? N  Managing your Medications? N  Managing your Finances? N  Housekeeping or managing your Housekeeping? N  Some recent data might be hidden    Patient Care Team: Emeterio Reeve, DO as PCP - General (Osteopathic Medicine) Rosita Kea, PA-C as Consulting Physician (Rheumatology)  Indicate any recent Medical Services you may have received from other than [facility] providers in the past year (date may be approximate).     Assessment:   This is a routine wellness examination for [place].  Hearing/Vision screen No exam data present  Dietary issues and exercise activities discussed: Current Exercise Habits: Home exercise routine, Type of exercise: walking, Time (Minutes): 30, Frequency (Times/Week): 7, Weekly Exercise (Minutes/Week): 210, Intensity: Moderate  Goals    . DIET - INCREASE WATER INTAKE     Increase water intake to 64 ounces a day.    . Weight (lb) < 200 lb (90.7 kg)     Patient states would like to loose at least 10  Lbs in 2020.      Depression Screen PHQ 2/9 Scores 03/24/2020 06/22/2019 12/19/2018 05/24/2018 11/25/2017 05/26/2017 05/26/2017  PHQ - 2 Score 0 1 0 0 3 0 0  PHQ- 9 Score 0 4 - - 5 - -    Fall Risk Fall Risk  03/24/2020 01/11/2019 12/19/2018 11/25/2017 05/26/2017  Falls in the past year? 1 - 0 Yes No  Number falls in past yr: 0 - - 1 -  Injury with Fall? 0 - - No -  Risk for fall due to  : - - - Impaired mobility -  Risk for fall due to: Comment - - - knee OA -  Follow up Falls evaluation completed Education provided  Falls prevention discussed Education provided;Falls prevention discussed -    Any stairs in or around the home? Yes  If so, are there any without handrails? Yes  Home free of loose throw rugs in walkways, pet beds, electrical cords, etc? No  Adequate lighting in your home to reduce risk of falls? Yes   ASSISTIVE DEVICES UTILIZED TO PREVENT FALLS:  Life alert? No  Use of a cane, walker or w/c? No  Grab bars in the bathroom? Yes  Shower chair or bench in shower? No  Elevated toilet seat or a handicapped toilet? No   TIMED UP AND GO:  Was the test performed? No .  Length of time to ambulate 10 feet: Not performed  Gait steady and fast without use of assistive device  Cognitive Function:     6CIT Screen 03/24/2020 12/19/2018  What Year? 0 points 0 points  What month? 0 points 0 points  What time? 0 points 0 points  Count back from 20 0 points 2 points  Months in reverse 0 points 0 points  Repeat phrase 0 points 0 points  Total Score 0 2    Immunizations Immunization History  Administered Date(s) Administered  . Influenza, High Dose Seasonal PF 05/26/2017  . PFIZER SARS-COV-2 Vaccination 12/03/2019, 01/01/2020  . Tdap 03/24/2017    TDAP status: Up to date Flu Vaccine status: Up to date Pneumococcal vaccine status: Up to date Covid-19 vaccine status: Completed vaccines  Qualifies for Shingles Vaccine? Yes, waiting until the end of 2021, had shingles last year  Zostavax completed No   Shingrix Completed?: No.    Education has been provided regarding the importance of this vaccine. Patient has been advised to call insurance company to determine out of pocket expense if they have not yet received this vaccine. Advised may also receive vaccine at local pharmacy or Health Dept. Verbalized acceptance and understanding.  Screening Tests Health  Maintenance  Topic Date Due  . Hepatitis C Screening  Never done  . DEXA SCAN  Never done  . INFLUENZA VACCINE  05/04/2020  . MAMMOGRAM  06/14/2020  . TETANUS/TDAP  03/25/2027  . COLONOSCOPY  04/04/2027  . COVID-19 Vaccine  Completed  . PNA vac Low Risk Adult  Completed    Health Maintenance  Health Maintenance Due  Topic Date Due  . Hepatitis C Screening  Never done  . DEXA SCAN  Never done    Colorectal cancer screening: Completed 2018. Repeat every 10 years Mammogram status: Ordered today 03/24/2020. Pt provided with contact info and advised to call to schedule appt.  Bone Density status: Ordered today. Pt provided with contact info and advised to call to schedule appt.  Lung Cancer Screening: (Low Dose CT Chest recommended if Age 84-80 years, 30 pack-year currently smoking OR have quit w/in 15years.) does not qualify.   Lung Cancer Screening Referral: Not applicable  Additional Screening:  Hepatitis C Screening: does qualify; Completed today, 03/24/2020.  Vision Screening: Recommended annual ophthalmology exams for early detection of glaucoma and other disorders of the eye. Is the patient up to date with their annual eye exam?  Yes  Who is the provider or what is the name of the office in which the patient attends annual eye exams? [facility] If pt is not established with a provider, would they like to be referred to a provider to establish care? No .   Dental Screening: Recommended annual dental exams for proper oral hygiene  Community Resource Referral / Chronic Care  Management: CRR required this visit?  No   CCM required this visit?  No      Plan:      Ms. Renne , Thank you for taking time to come for your Medicare Wellness Visit. I appreciate your ongoing commitment to your health goals. Please review the following plan we discussed and let me know if I can assist you in the future.   These are the goals we discussed: Goals    . DIET -  INCREASE WATER INTAKE     Increase water intake to 64 ounces a day.    . Weight (lb) < 200 lb (90.7 kg)     Patient states would like to loose at least 10  Lbs in 2020.       This is a list of the screening recommended for you and due dates:  Health Maintenance  Topic Date Due  .  Hepatitis C: One time screening is recommended by Center for Disease Control  (CDC) for  adults born from [birth year] through 1967.   Never done  . DEXA scan (bone density measurement)  Never done  . Flu Shot  05/04/2020  . Mammogram  06/14/2020  . Tetanus Vaccine  03/25/2027  . Colon Cancer Screening  04/04/2027  . COVID-19 Vaccine  Completed  . Pneumonia vaccines  Completed     I have personally reviewed and noted the following in the patient's chart:   . Medical and social history . Use of alcohol, tobacco or illicit drugs  . Current medications and supplements . Functional ability and status . Nutritional status . Physical activity . Advanced directives . List of other physicians . Hospitalizations, surgeries, and ER visits in previous 12 months . Vitals . Screenings to include cognitive, depression, and falls . Referrals and appointments  In addition, I have reviewed and discussed with patient certain preventive protocols, quality metrics, and best practice recommendations. A written personalized care plan for preventive services as well as general preventive health recommendations were provided to patient.    Samuel Bouche, NP   03/24/2020

## 2020-03-27 ENCOUNTER — Telehealth: Payer: Self-pay | Admitting: Osteopathic Medicine

## 2020-03-27 ENCOUNTER — Other Ambulatory Visit: Payer: Self-pay

## 2020-03-27 ENCOUNTER — Encounter: Payer: Self-pay | Admitting: Osteopathic Medicine

## 2020-03-27 ENCOUNTER — Ambulatory Visit (INDEPENDENT_AMBULATORY_CARE_PROVIDER_SITE_OTHER): Payer: Medicare HMO | Admitting: Osteopathic Medicine

## 2020-03-27 VITALS — BP 150/85 | HR 74 | Temp 97.6°F | Wt 207.1 lb

## 2020-03-27 DIAGNOSIS — E785 Hyperlipidemia, unspecified: Secondary | ICD-10-CM | POA: Diagnosis not present

## 2020-03-27 DIAGNOSIS — M069 Rheumatoid arthritis, unspecified: Secondary | ICD-10-CM

## 2020-03-27 DIAGNOSIS — I129 Hypertensive chronic kidney disease with stage 1 through stage 4 chronic kidney disease, or unspecified chronic kidney disease: Secondary | ICD-10-CM | POA: Diagnosis not present

## 2020-03-27 DIAGNOSIS — R739 Hyperglycemia, unspecified: Secondary | ICD-10-CM

## 2020-03-27 DIAGNOSIS — Z1159 Encounter for screening for other viral diseases: Secondary | ICD-10-CM

## 2020-03-27 DIAGNOSIS — N183 Chronic kidney disease, stage 3 unspecified: Secondary | ICD-10-CM | POA: Diagnosis not present

## 2020-03-27 DIAGNOSIS — R7401 Elevation of levels of liver transaminase levels: Secondary | ICD-10-CM

## 2020-03-27 NOTE — Telephone Encounter (Signed)
Can we send records request [facility] rheumatology to send [place] most recent lab results from June?  Thanks

## 2020-03-27 NOTE — Patient Instructions (Signed)
MyChart will message you in a week to let us know what you rbP numbers are looking like!

## 2020-03-27 NOTE — Progress Notes (Signed)
Melody Moore is a 69 y.o. female who presents to  [place] [facility]  today, 03/27/20, seeking care for the following:  . BP check-up . Recent labs w/ rheumatology ok per patient 03/19/2020,unable to pull these up on her phone, bad internet connection here in office      ASSESSMENT & PLAN with other pertinent findings:  The primary encounter diagnosis was Benign hypertension with CKD (chronic kidney disease) stage III. Diagnoses of Dyslipidemia, Stage 3 chronic kidney disease, unspecified whether stage 3a or 3b CKD, Hyperglycemia, unspecified, Encounter for hepatitis C screening test for low risk patient, Rheumatoid arthritis involving multiple sites, unspecified whether rheumatoid factor present ([place]), and Transaminitis were also pertinent to this visit.   No results found for this or any previous visit (from the past 24 hour(s)).  BP Readings from Last 3 Encounters:  03/27/20 (!) 150/85  03/24/20 (!) 165/79  09/21/19 (!) 149/83   Will request records from rheum to review labs    Patient Instructions  MyChart will message you in a week to let us know what you rbP numbers are looking like!    Orders Placed This Encounter  Procedures  . CBC  . COMPLETE METABOLIC PANEL WITH GFR  . Lipid panel  . TSH  . Hepatitis C antibody    No orders of the defined types were placed in this encounter.      Follow-up instructions: Return in about 6 months (around 09/26/2020) for ROUTINE CHECK-UP, LABS, BP.                                         BP (!) 150/85 (BP Location: Left Arm, Patient Position: Sitting, Cuff Size: Large)   Pulse 74   Temp 97.6 F (36.4 C) (Oral)   Wt 207 lb 1.3 oz (93.9 kg)   BMI 34.46 kg/m   Current Meds  Medication Sig  . acetaminophen (TYLENOL) 650 MG CR tablet Take 2 tablets (1,300 mg total) by mouth 2 (two) times daily as needed for pain.  Marland Kitchen amLODipine  (NORVASC) 5 MG tablet Take 1 tablet (5 mg total) by mouth daily.  Marland Kitchen aspirin EC 81 MG tablet Take 81 mg by mouth daily.  Marland Kitchen atorvastatin (LIPITOR) 40 MG tablet Take 1 tablet (40 mg total) by mouth daily.  . Calcium Carb-Cholecalciferol (CALCIUM 600 + D PO) Take 2 tablets by mouth daily.  . Cholecalciferol (VITAMIN D) 2000 units tablet Take 2,000 Units by mouth daily.  Marland Kitchen docusate sodium (COLACE) 100 MG capsule Take 100 mg by mouth 2 (two) times daily.  Marland Kitchen EPINEPHrine (EPIPEN 2-PAK) 0.3 mg/0.3 mL IJ SOAJ injection Inject 0.3 mLs (0.3 mg total) into the muscle once as needed for up to 1 dose.  . ferrous sulfate 325 (65 FE) MG tablet Take 325 mg by mouth 2 (two) times daily.  . fluticasone (FLONASE) 50 MCG/ACT nasal spray Place 2 sprays into both nostrils daily.  . hydroxychloroquine (PLAQUENIL) 200 MG tablet Take 200 mg by mouth 2 (two) times daily.   Marland Kitchen leflunomide (ARAVA) 20 MG tablet Take 1 tablet by mouth daily.  Marland Kitchen leucovorin (WELLCOVORIN) 5 MG tablet Take 5 mg by mouth daily.   Marland Kitchen levothyroxine (SYNTHROID) 50 MCG tablet Take 1 tablet (50 mcg total) by mouth daily before breakfast.  . quinapril (ACCUPRIL) 40 MG tablet Take 1 tablet (40 mg total) by  mouth daily.  . traZODone (DESYREL) 50 MG tablet TAKE 1-2 TABLETS BY MOUTH AT BEDTIME AS NEEDED  . triamcinolone ointment (KENALOG) 0.5 % Apply 1 application topically daily as needed.    No results found for this or any previous visit (from the past 72 hour(s)).  No results found.     All questions at time of visit were answered - patient instructed to contact office with any additional concerns or updates.  ER/RTC precautions were reviewed with the patient as applicable.   Please note: voice recognition software was used to produce this document, and typos may escape review. Please contact Dr. Sheppard Coil for any needed clarifications.   Total encounter time: 30 minutes.

## 2020-03-28 NOTE — Telephone Encounter (Signed)
Attempted to call [facility] rheumatology, this office was closed will try again tomorrow.

## 2020-04-01 NOTE — Telephone Encounter (Signed)
Let's try again today to get records?  Thanks!

## 2020-04-03 ENCOUNTER — Other Ambulatory Visit: Payer: Self-pay

## 2020-04-03 ENCOUNTER — Ambulatory Visit (INDEPENDENT_AMBULATORY_CARE_PROVIDER_SITE_OTHER): Payer: Medicare HMO

## 2020-04-03 DIAGNOSIS — Z1231 Encounter for screening mammogram for malignant neoplasm of breast: Secondary | ICD-10-CM

## 2020-04-03 NOTE — Telephone Encounter (Signed)
They are faxing it now.

## 2020-04-04 MED ORDER — AMLODIPINE BESYLATE 5 MG PO TABS: 10.0000 mg | ORAL_TABLET | Freq: Every day | ORAL | 3 refills | Status: DC

## 2020-04-04 NOTE — Addendum Note (Signed)
Addended by: Maryla Morrow on: 04/04/2020 12:54 PM   Modules accepted: Orders

## 2020-04-23 NOTE — Telephone Encounter (Signed)
Has provider receive the copy of records from [facility]?

## 2020-05-13 ENCOUNTER — Telehealth: Payer: Self-pay | Admitting: Osteopathic Medicine

## 2020-05-13 NOTE — Telephone Encounter (Signed)
PT called wanting to switch Primary care with Dr. Zigmund Daniel. There are no complaints. Her husband sees Dr. Zigmund Daniel. They usually make appointments together.  Thoughts? I will contact the patient on the answer given.

## 2020-05-13 NOTE — Telephone Encounter (Signed)
I don't have an issue with it but will have to wait until Dr Jerilynn Mages is back for him to make final decision

## 2020-05-19 NOTE — Telephone Encounter (Signed)
Ok with me 

## 2020-05-20 NOTE — Telephone Encounter (Signed)
Ok thanks be sure to switch to [facility] as PCP and cancel any upcoming visit w/ me if there are any

## 2020-06-04 ENCOUNTER — Encounter: Payer: Self-pay | Admitting: Family Medicine

## 2020-06-06 ENCOUNTER — Telehealth: Payer: Self-pay

## 2020-06-06 MED ORDER — AMLODIPINE BESYLATE 10 MG PO TABS: 10.0000 mg | ORAL_TABLET | Freq: Every day | ORAL | 1 refills | Status: DC

## 2020-06-06 NOTE — Telephone Encounter (Signed)
Changed amlopidine 5mg  tab (10mg  daily) to amlodipine besylate 10mg  tab (QD).

## 2020-06-11 ENCOUNTER — Other Ambulatory Visit: Payer: Self-pay | Admitting: Osteopathic Medicine

## 2020-06-12 NOTE — Telephone Encounter (Signed)
Last refill -03/19/2020 Last Ov- 03/27/2020

## 2020-06-19 DIAGNOSIS — Z6832 Body mass index (BMI) 32.0-32.9, adult: Secondary | ICD-10-CM | POA: Diagnosis not present

## 2020-06-19 DIAGNOSIS — B0223 Postherpetic polyneuropathy: Secondary | ICD-10-CM | POA: Diagnosis not present

## 2020-06-19 DIAGNOSIS — E669 Obesity, unspecified: Secondary | ICD-10-CM | POA: Diagnosis not present

## 2020-06-19 DIAGNOSIS — M069 Rheumatoid arthritis, unspecified: Secondary | ICD-10-CM | POA: Diagnosis not present

## 2020-06-19 DIAGNOSIS — Z7189 Other specified counseling: Secondary | ICD-10-CM | POA: Diagnosis not present

## 2020-06-19 DIAGNOSIS — M353 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: Secondary | ICD-10-CM | POA: Diagnosis not present

## 2020-06-19 DIAGNOSIS — B0230 Zoster ocular disease, unspecified: Secondary | ICD-10-CM | POA: Diagnosis not present

## 2020-06-19 DIAGNOSIS — M150 Primary generalized (osteo)arthritis: Secondary | ICD-10-CM | POA: Diagnosis not present

## 2020-06-28 ENCOUNTER — Other Ambulatory Visit: Payer: Self-pay | Admitting: Family Medicine

## 2020-07-03 ENCOUNTER — Encounter: Payer: Self-pay | Admitting: Family Medicine

## 2020-07-03 ENCOUNTER — Ambulatory Visit (INDEPENDENT_AMBULATORY_CARE_PROVIDER_SITE_OTHER): Payer: Medicare HMO | Admitting: Family Medicine

## 2020-07-03 VITALS — BP 137/82 | HR 77 | Wt 200.5 lb

## 2020-07-03 DIAGNOSIS — R7401 Elevation of levels of liver transaminase levels: Secondary | ICD-10-CM | POA: Diagnosis not present

## 2020-07-03 DIAGNOSIS — M069 Rheumatoid arthritis, unspecified: Secondary | ICD-10-CM

## 2020-07-03 DIAGNOSIS — R739 Hyperglycemia, unspecified: Secondary | ICD-10-CM | POA: Diagnosis not present

## 2020-07-03 DIAGNOSIS — Z1159 Encounter for screening for other viral diseases: Secondary | ICD-10-CM | POA: Diagnosis not present

## 2020-07-03 DIAGNOSIS — Z23 Encounter for immunization: Secondary | ICD-10-CM

## 2020-07-03 DIAGNOSIS — E785 Hyperlipidemia, unspecified: Secondary | ICD-10-CM

## 2020-07-03 DIAGNOSIS — I10 Essential (primary) hypertension: Secondary | ICD-10-CM

## 2020-07-03 DIAGNOSIS — I129 Hypertensive chronic kidney disease with stage 1 through stage 4 chronic kidney disease, or unspecified chronic kidney disease: Secondary | ICD-10-CM | POA: Diagnosis not present

## 2020-07-03 DIAGNOSIS — N183 Chronic kidney disease, stage 3 unspecified: Secondary | ICD-10-CM | POA: Diagnosis not present

## 2020-07-03 MED ORDER — AMLODIPINE BESYLATE 10 MG PO TABS: 10.0000 mg | ORAL_TABLET | Freq: Every day | ORAL | 3 refills | Status: DC

## 2020-07-03 NOTE — Progress Notes (Signed)
Melody Moore - 69 y.o. female MRN [id number]  Date of birth: 02/15/1951  Subjective Chief Complaint  Patient presents with  . Follow-up    HPI Melody Moore is a 69 y.o. female here today for initial visit with me.  She was seeing Dr. Sheppard Coil previously.  She has a history of HTN, CKD III, hypothyroidism, rheumatoid arthritis and insomnia.  She is followed by rheumatology for management of antirheumatics.  Current treatment with leflunomide, plaquenil and leflunomide.  She has had continued joint pain and effusions of her hands and humira was added recently.  After insurance her co-pay is $1000 per month.  She has not looked into assistance programs.   HTN is managed with quinapril and HCTZ.  She is doing well with this and denies side effects.  She has not had any symptoms related to HTN including chest pain, shortness of breath, palpitations, headache or vision changes.    She continues to tolerate atorvastatin well for mgmt of HLD.    Insomnia is well managed with trazodone.   ROS:  A comprehensive ROS was completed and negative except as noted per HPI  Allergies  Allergen Reactions  . Bee Venom Anaphylaxis  . Methotrexate Derivatives Other (See Comments)    Acute kidney injury  . Oxycodone Other (See Comments)    Adrenal crisis while taking both prednisone and oxycodone   . Prednisone     Adrenal crisis while taking both prednisone and oxycodone   . Verapamil Hives  . Nickel Rash  . Nitrofuran Derivatives Rash    Past Medical History:  Diagnosis Date  . Adrenal crisis ([place]) 02/12/2016  . AKI (acute kidney injury) ([place]) 02/2016  . Allergy   . Arthritis   . Carotid stenosis, asymptomatic, bilateral 05/26/2017   Dopplers 02/12/2016: right - mid-distal 40-59% stenosis; left - 40-59% stenosis  . Cataract   . CKD (chronic kidney disease) stage 3, GFR 30-59 ml/min 11/02/2017  . Decreased calculated GFR 06/15/2017  . Gallstones 06/22/2017  . History of echocardiogram  11/07/2017   04/09/2016: EF 32%, mild diastolic dysfunction, trivial MR and TR  . Hypertension   . Hypothyroidism   . 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 Rheumatoid arthritis ([place])   . Rheumatoid arthritis involving multiple sites ([place]) 05/26/2017  . Transaminitis 06/15/2017   Normal abdominal US 06/22/17    Past Surgical History:  Procedure Laterality Date  . APPENDECTOMY    . CATARACT EXTRACTION, BILATERAL  2015  . 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  . TOTAL KNEE ARTHROPLASTY Right 12/16/2017   Procedure: RIGHT TOTAL KNEE ARTHROPLASTY;  Surgeon: Dorna Leitz, MD;  Location: [facility];  Service: Orthopedics;  Laterality: Right;  Adductor Block    Social History   Socioeconomic History  . Marital status: Widowed    Spouse name: Not on file  . Number of children: 0  . Years of education: 16  . Highest education level: Bachelor's degree (e.g., BA, AB, BS)  Occupational History  . Occupation: [employer]    Comment: retired  Tobacco Use  . Smoking status: Former Smoker    Types: Cigarettes    Quit date: 02/07/1974    Years since quitting: 46.4  . Smokeless tobacco: Never Used  Vaping Use  . Vaping Use: Never used  Substance and Sexual Activity  . Alcohol use: Yes    Comment: occasionally  . Drug use: No  . Sexual activity: Yes    Partners: Male    Birth control/protection: Post-menopausal  Other Topics Concern  . Not on file  Social History Narrative   Walks the dog everyday. Runs errands. Caffeine use 2 cups of coffee   Social Determinants of Health   Financial Resource Strain:   . Difficulty of Paying Living Expenses: Not on file  Food Insecurity:   . Worried About Charity fundraiser in the Last Year: Not on file  . Ran Out of Food in the Last Year: Not on file  Transportation Needs:   . Lack of Transportation (Medical): Not on file  . Lack of Transportation (Non-Medical): Not on file  Physical Activity:   . Days of Exercise per  Week: Not on file  . Minutes of Exercise per Session: Not on file  Stress:   . Feeling of Stress : Not on file  Social Connections:   . Frequency of Communication with Friends and Family: Not on file  . Frequency of Social Gatherings with Friends and Family: Not on file  . Attends Religious Services: Not on file  . Active Member of Clubs or Organizations: Not on file  . Attends Archivist Meetings: Not on file  . Marital Status: Not on file    Family History  Problem Relation Age of Onset  . Diabetes Mother   . Lung cancer Father   . Hypertension Father   . Uterine cancer Sister     Health Maintenance  Topic Date Due  . Hepatitis C Screening  Never done  . DEXA SCAN  Never done  . MAMMOGRAM  04/03/2022  . TETANUS/TDAP  03/25/2027  . COLONOSCOPY  04/04/2027  . INFLUENZA VACCINE  Completed  . COVID-19 Vaccine  Completed  . PNA vac Low Risk Adult  Completed     ----------------------------------------------------------------------------------------------------------------------------------------------------------------------------------------------------------------- Physical Exam BP 137/82 (BP Location: Left Arm, Patient Position: Sitting, Cuff Size: Large)   Pulse 77   Wt 200 lb 8 oz (90.9 kg)   SpO2 96%   BMI 33.36 kg/m   Physical Exam Constitutional:      Appearance: Normal appearance.  HENT:     Head: Normocephalic and atraumatic.  Eyes:     General: No scleral icterus. Cardiovascular:     Rate and Rhythm: Normal rate and regular rhythm.  Pulmonary:     Effort: Pulmonary effort is normal.     Breath sounds: Normal breath sounds.  Musculoskeletal:     Cervical back: Neck supple.  Neurological:     Mental Status: She is alert.  Psychiatric:        Mood and Affect: Mood normal.        Behavior: Behavior normal.      ------------------------------------------------------------------------------------------------------------------------------------------------------------------------------------------------------------------- Assessment and Plan  Essential hypertension Blood pressure is at goal at for age and co-morbidities.  I recommend continuation of amlodipine, quinapril and HCTZ.  In addition they were instructed to follow a low sodium diet with regular exercise to help to maintain adequate control of blood pressure.    Chronic kidney disease, stage 3a ([place]) Update renal function today.    Dyslipidemia Update lipid panel and liver function tests. She is currently tolerating atorvastatin well.    Rheumatoid arthritis involving multiple sites ([place]) Humira recently added to medications however having some difficulty with affording this.  Printed patient assistance form for her to complete.     Meds ordered this encounter  Medications  . amLODipine (NORVASC) 10 MG tablet    Sig: Take 1 tablet (10 mg total) by mouth daily.    Dispense:  90  tablet    Refill:  3    Return in about 6 months (around 12/31/2020) for HTN.    This visit occurred during the SARS-CoV-2 public health emergency.  Safety protocols were in place, including screening questions prior to the visit, additional usage of staff PPE, and extensive cleaning of exam room while observing appropriate contact time as indicated for disinfecting solutions.

## 2020-07-03 NOTE — Assessment & Plan Note (Signed)
Humira recently added to medications however having some difficulty with affording this.  Printed patient assistance form for her to complete.

## 2020-07-03 NOTE — Assessment & Plan Note (Addendum)
Blood pressure is at goal at for age and co-morbidities.  I recommend continuation of amlodipine, quinapril and HCTZ.  In addition they were instructed to follow a low sodium diet with regular exercise to help to maintain adequate control of blood pressure.

## 2020-07-03 NOTE — Assessment & Plan Note (Signed)
Update lipid panel and liver function tests. She is currently tolerating atorvastatin well.

## 2020-07-03 NOTE — Assessment & Plan Note (Signed)
Update renal function today. 

## 2020-07-03 NOTE — Patient Instructions (Addendum)
I would recommend that you have a shingles shot and a COVID booster vaccine.   Look into the Humira assistance program.  Please have labs completed.  See me again in 6 months.

## 2020-07-04 LAB — CBC
HCT: 39.9 % (ref 35.0–45.0)
Hemoglobin: 13.3 g/dL (ref 11.7–15.5)
MCH: 31.5 pg (ref 27.0–33.0)
MCHC: 33.3 g/dL (ref 32.0–36.0)
MCV: 94.5 fL (ref 80.0–100.0)
MPV: 12.2 fL (ref 7.5–12.5)
Platelets: 211 10*3/uL (ref 140–400)
RBC: 4.22 10*6/uL (ref 3.80–5.10)
RDW: 11.9 % (ref 11.0–15.0)
WBC: 7.5 10*3/uL (ref 3.8–10.8)

## 2020-07-04 LAB — LIPID PANEL
Cholesterol: 179 mg/dL (ref <200)
HDL: 108 mg/dL (ref >=50)
LDL Cholesterol (Calc): 54 mg/dL (calc)
Non-HDL Cholesterol (Calc): 71 mg/dL (calc) (ref <130)
Total CHOL/HDL Ratio: 1.7 (calc) (ref <5.0)
Triglycerides: 83 mg/dL (ref <150)

## 2020-07-04 LAB — COMPLETE METABOLIC PANEL WITH GFR
AG Ratio: 2.1 (calc) (ref 1.0–2.5)
ALT: 19 U/L (ref 6–29)
AST: 25 U/L (ref 10–35)
Albumin: 4.4 g/dL (ref 3.6–5.1)
Alkaline phosphatase (APISO): 71 U/L (ref 37–153)
BUN/Creatinine Ratio: 16 (calc) (ref 6–22)
BUN: 21 mg/dL (ref 7–25)
CO2: 27 mmol/L (ref 20–32)
Calcium: 9.5 mg/dL (ref 8.6–10.4)
Chloride: 103 mmol/L (ref 98–110)
Creat: 1.35 mg/dL — ABNORMAL HIGH (ref 0.60–0.93)
GFR, Est African American: 46 mL/min/{1.73_m2} — ABNORMAL LOW (ref >=60)
GFR, Est Non African American: 39 mL/min/{1.73_m2} — ABNORMAL LOW (ref >=60)
Globulin: 2.1 g/dL (calc) (ref 1.9–3.7)
Glucose, Bld: 99 mg/dL (ref 65–99)
Potassium: 4.3 mmol/L (ref 3.5–5.3)
Sodium: 139 mmol/L (ref 135–146)
Total Bilirubin: 0.6 mg/dL (ref 0.2–1.2)
Total Protein: 6.5 g/dL (ref 6.1–8.1)

## 2020-07-04 LAB — HEPATITIS C ANTIBODY
Hepatitis C Ab: NONREACTIVE
SIGNAL TO CUT-OFF: 0.02 (ref <1.00)

## 2020-07-04 LAB — TSH: TSH: 1.01 mIU/L (ref 0.40–4.50)

## 2020-08-07 DIAGNOSIS — Z961 Presence of intraocular lens: Secondary | ICD-10-CM | POA: Diagnosis not present

## 2020-08-07 DIAGNOSIS — H26493 Other secondary cataract, bilateral: Secondary | ICD-10-CM | POA: Diagnosis not present

## 2020-08-07 DIAGNOSIS — D3142 Benign neoplasm of left ciliary body: Secondary | ICD-10-CM | POA: Diagnosis not present

## 2020-08-07 DIAGNOSIS — Z79899 Other long term (current) drug therapy: Secondary | ICD-10-CM | POA: Diagnosis not present

## 2020-08-07 DIAGNOSIS — B029 Zoster without complications: Secondary | ICD-10-CM | POA: Diagnosis not present

## 2020-08-07 DIAGNOSIS — H527 Unspecified disorder of refraction: Secondary | ICD-10-CM | POA: Diagnosis not present

## 2020-08-07 DIAGNOSIS — H02403 Unspecified ptosis of bilateral eyelids: Secondary | ICD-10-CM | POA: Diagnosis not present

## 2020-08-07 DIAGNOSIS — M059 Rheumatoid arthritis with rheumatoid factor, unspecified: Secondary | ICD-10-CM | POA: Diagnosis not present

## 2020-09-10 ENCOUNTER — Other Ambulatory Visit: Payer: Self-pay | Admitting: Family Medicine

## 2020-09-18 DIAGNOSIS — E669 Obesity, unspecified: Secondary | ICD-10-CM | POA: Diagnosis not present

## 2020-09-18 DIAGNOSIS — M353 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: Secondary | ICD-10-CM | POA: Diagnosis not present

## 2020-09-18 DIAGNOSIS — Z79899 Other long term (current) drug therapy: Secondary | ICD-10-CM | POA: Diagnosis not present

## 2020-09-18 DIAGNOSIS — M150 Primary generalized (osteo)arthritis: Secondary | ICD-10-CM | POA: Diagnosis not present

## 2020-09-18 DIAGNOSIS — B0223 Postherpetic polyneuropathy: Secondary | ICD-10-CM | POA: Diagnosis not present

## 2020-09-18 DIAGNOSIS — M069 Rheumatoid arthritis, unspecified: Secondary | ICD-10-CM | POA: Diagnosis not present

## 2020-09-18 DIAGNOSIS — Z6832 Body mass index (BMI) 32.0-32.9, adult: Secondary | ICD-10-CM | POA: Diagnosis not present

## 2020-09-18 DIAGNOSIS — Z7189 Other specified counseling: Secondary | ICD-10-CM | POA: Diagnosis not present

## 2020-09-18 DIAGNOSIS — B0230 Zoster ocular disease, unspecified: Secondary | ICD-10-CM | POA: Diagnosis not present

## 2020-09-22 ENCOUNTER — Telehealth: Payer: Self-pay | Admitting: General Practice

## 2020-09-23 NOTE — Telephone Encounter (Signed)
Patient called back stated that she already had one in June, 2021. Patient is not due for another one until June, 2022.

## 2020-10-03 ENCOUNTER — Other Ambulatory Visit: Payer: Self-pay | Admitting: Osteopathic Medicine

## 2020-10-03 DIAGNOSIS — I6523 Occlusion and stenosis of bilateral carotid arteries: Secondary | ICD-10-CM

## 2020-10-15 ENCOUNTER — Other Ambulatory Visit: Payer: Self-pay | Admitting: Osteopathic Medicine

## 2020-10-15 DIAGNOSIS — I129 Hypertensive chronic kidney disease with stage 1 through stage 4 chronic kidney disease, or unspecified chronic kidney disease: Secondary | ICD-10-CM

## 2020-10-15 DIAGNOSIS — N183 Chronic kidney disease, stage 3 unspecified: Secondary | ICD-10-CM

## 2020-10-26 ENCOUNTER — Other Ambulatory Visit: Payer: Self-pay | Admitting: Osteopathic Medicine

## 2020-11-17 DIAGNOSIS — M150 Primary generalized (osteo)arthritis: Secondary | ICD-10-CM | POA: Diagnosis not present

## 2020-11-17 DIAGNOSIS — M069 Rheumatoid arthritis, unspecified: Secondary | ICD-10-CM | POA: Diagnosis not present

## 2020-11-17 DIAGNOSIS — E669 Obesity, unspecified: Secondary | ICD-10-CM | POA: Diagnosis not present

## 2020-11-17 DIAGNOSIS — M353 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: Secondary | ICD-10-CM | POA: Diagnosis not present

## 2020-11-17 DIAGNOSIS — B0223 Postherpetic polyneuropathy: Secondary | ICD-10-CM | POA: Diagnosis not present

## 2020-11-17 DIAGNOSIS — Z7189 Other specified counseling: Secondary | ICD-10-CM | POA: Diagnosis not present

## 2020-11-17 DIAGNOSIS — B0230 Zoster ocular disease, unspecified: Secondary | ICD-10-CM | POA: Diagnosis not present

## 2020-11-17 DIAGNOSIS — Z6831 Body mass index (BMI) 31.0-31.9, adult: Secondary | ICD-10-CM | POA: Diagnosis not present

## 2020-11-20 ENCOUNTER — Telehealth: Payer: Self-pay

## 2020-11-21 NOTE — Telephone Encounter (Signed)
Ms. Barbone was calling concerning her significant other. Advised her to schedule an appt for him.

## 2020-12-04 ENCOUNTER — Other Ambulatory Visit: Payer: Self-pay | Admitting: Family Medicine

## 2020-12-19 ENCOUNTER — Other Ambulatory Visit: Payer: Self-pay | Admitting: Osteopathic Medicine

## 2020-12-23 ENCOUNTER — Encounter: Payer: Self-pay | Admitting: Family Medicine

## 2020-12-31 ENCOUNTER — Ambulatory Visit (INDEPENDENT_AMBULATORY_CARE_PROVIDER_SITE_OTHER): Payer: Medicare HMO | Admitting: Family Medicine

## 2020-12-31 ENCOUNTER — Other Ambulatory Visit: Payer: Self-pay

## 2020-12-31 ENCOUNTER — Encounter: Payer: Self-pay | Admitting: Family Medicine

## 2020-12-31 VITALS — BP 139/66 | HR 94 | Temp 97.9°F | Ht 63.0 in | Wt 193.8 lb

## 2020-12-31 DIAGNOSIS — E785 Hyperlipidemia, unspecified: Secondary | ICD-10-CM | POA: Diagnosis not present

## 2020-12-31 DIAGNOSIS — M069 Rheumatoid arthritis, unspecified: Secondary | ICD-10-CM

## 2020-12-31 DIAGNOSIS — I10 Essential (primary) hypertension: Secondary | ICD-10-CM | POA: Diagnosis not present

## 2020-12-31 DIAGNOSIS — E039 Hypothyroidism, unspecified: Secondary | ICD-10-CM

## 2020-12-31 DIAGNOSIS — I129 Hypertensive chronic kidney disease with stage 1 through stage 4 chronic kidney disease, or unspecified chronic kidney disease: Secondary | ICD-10-CM | POA: Diagnosis not present

## 2020-12-31 DIAGNOSIS — N183 Chronic kidney disease, stage 3 unspecified: Secondary | ICD-10-CM

## 2020-12-31 DIAGNOSIS — N1831 Chronic kidney disease, stage 3a: Secondary | ICD-10-CM

## 2020-12-31 MED ORDER — QUINAPRIL HCL 40 MG PO TABS: 40.0000 mg | ORAL_TABLET | Freq: Every day | ORAL | 3 refills | Status: DC

## 2020-12-31 NOTE — Assessment & Plan Note (Signed)
Update BMP today.

## 2020-12-31 NOTE — Assessment & Plan Note (Addendum)
BP elevated in clinic. Repeat reading improved. Home readings reviewed and has been well controlled.  She will continue home monitoring.  Recommend low sodium diet and continuation of current medications.

## 2020-12-31 NOTE — Assessment & Plan Note (Signed)
Updated TSH today.

## 2020-12-31 NOTE — Assessment & Plan Note (Signed)
Doing well with atorvastatin. Continue at current dose.

## 2020-12-31 NOTE — Progress Notes (Signed)
Melody Moore - 70 y.o. female MRN [id number]  Date of birth: 25-Jun-1951  Subjective Chief Complaint  Patient presents with  . Hypertension    HPI Melody Moore is  70 y.o. female here today for follow up visit.  She has history of HTN, hypothyroidism, RA and insomnia.   She is doing well with current medication for HTN.  These including amlodipine and quinapril.  No side effects noted.  Her BP is elevated today but she is checking at home with normal readings.  She denies headache, chest pain, palpitations or vision changes.   She is taking atorvastatin for management of HLD.  No side effects from medication.   Trazodone continues to work well for sleep.  No morning fatigue or sedation.   She is seeing rheumatology for management of anti rheumatic medications.  She was recently tried on mesalamine but had nausea and this was discontinued.  She continues on plaquenil, leflunomide and leucovorin.  She did have recent eye appt.   ROS:  A comprehensive ROS was completed and negative except as noted per HPI  Allergies  Allergen Reactions  . Bee Venom Anaphylaxis  . Methotrexate Derivatives Other (See Comments)    Acute kidney injury  . Oxycodone Other (See Comments)    Adrenal crisis while taking both prednisone and oxycodone   . Prednisone     Adrenal crisis while taking both prednisone and oxycodone   . Verapamil Hives  . Nickel Rash  . Nitrofuran Derivatives Rash  . Sulfasalazine Diarrhea and Nausea And Vomiting    Past Medical History:  Diagnosis Date  . 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 AKI (acute kidney injury) ([place]) 02/2016  . Allergy   . Arthritis   . Carotid stenosis, asymptomatic, bilateral 05/26/2017   Dopplers 02/12/2016: right - mid-distal 40-59% stenosis; left - 40-59% stenosis  . Cataract   . CKD (chronic kidney disease) stage 3, GFR 30-59 ml/min ([facility]) 11/02/2017  . Decreased calculated GFR 06/15/2017  . Gallstones 06/22/2017  . History of echocardiogram  11/07/2017   04/09/2016: EF 01%, mild diastolic dysfunction, trivial MR and TR  . Hypertension   . Hypothyroidism   . Immunosuppression due to drug therapy ([place]) 11/02/2017  . Rheumatoid arthritis ([place])   . Rheumatoid arthritis involving multiple sites ([place]) 05/26/2017  . Transaminitis 06/15/2017   Normal abdominal US 06/22/17    Past Surgical History:  Procedure Laterality Date  . APPENDECTOMY    . CATARACT EXTRACTION, BILATERAL  2015  . 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  . TOTAL KNEE ARTHROPLASTY Right 12/16/2017   Procedure: RIGHT TOTAL KNEE ARTHROPLASTY;  Surgeon: Dorna Leitz, MD;  Location: [facility];  Service: Orthopedics;  Laterality: Right;  Adductor Block    Social History   Socioeconomic History  . Marital status: Widowed    Spouse name: Not on file  . Number of children: 0  . Years of education: 16  . Highest education level: Bachelor's degree (e.g., BA, AB, BS)  Occupational History  . Occupation: [employer]    Comment: retired  Tobacco Use  . Smoking status: Former Smoker    Types: Cigarettes    Quit date: 02/07/1974    Years since quitting: 46.9  . Smokeless tobacco: Never Used  Vaping Use  . Vaping Use: Never used  Substance and Sexual Activity  . Alcohol use: Yes    Comment: occasionally  . Drug use: No  . Sexual activity: Yes    Partners: Male    Birth  control/protection: Post-menopausal  Other Topics Concern  . Not on file  Social History Narrative   Walks the dog everyday. Runs errands. Caffeine use 2 cups of coffee   Social Determinants of Health   Financial Resource Strain: Not on file  Food Insecurity: Not on file  Transportation Needs: Not on file  Physical Activity: Not on file  Stress: Not on file  Social Connections: Not on file    Family History  Problem Relation Age of Onset  . Diabetes Mother   . Lung cancer Father   . Hypertension Father   . Uterine cancer Sister     Health Maintenance  Topic Date Due  .  DEXA SCAN  Never done  . COVID-19 Vaccine (3 - Booster for Pfizer series) 07/03/2020  . MAMMOGRAM  04/03/2022  . TETANUS/TDAP  03/25/2027  . COLONOSCOPY (Pts 45-56yrs Insurance coverage will need to be confirmed)  04/04/2027  . INFLUENZA VACCINE  Completed  . Hepatitis C Screening  Completed  . PNA vac Low Risk Adult  Completed  . HPV VACCINES  Aged Out     ----------------------------------------------------------------------------------------------------------------------------------------------------------------------------------------------------------------- Physical Exam BP 139/66   Pulse 94   Temp 97.9 F (36.6 C)   Ht 5\' 3"  (1.6 m)   Wt 193 lb 12.8 oz (87.9 kg)   SpO2 99%   BMI 34.33 kg/m   Physical Exam Constitutional:      Appearance: Normal appearance.  HENT:     Head: Normocephalic and atraumatic.  Eyes:     General: No scleral icterus. Cardiovascular:     Rate and Rhythm: Normal rate and regular rhythm.  Pulmonary:     Effort: Pulmonary effort is normal.     Breath sounds: Normal breath sounds.  Musculoskeletal:     Cervical back: Neck supple.  Neurological:     General: No focal deficit present.     Mental Status: She is alert.  Psychiatric:        Mood and Affect: Mood normal.        Behavior: Behavior normal.     ------------------------------------------------------------------------------------------------------------------------------------------------------------------------------------------------------------------- Assessment and Plan  Benign hypertension with CKD (chronic kidney disease) stage III ([facility]) BP elevated in clinic. Repeat reading improved. Home readings reviewed and has been well controlled.  She will continue home monitoring.  Recommend low sodium diet and continuation of current medications.   Acquired hypothyroidism Updated TSH today.   Rheumatoid arthritis involving multiple sites [facility]) Managed by rheumatology.  Stable at  this time.   Chronic kidney disease, stage 3a ([place]) Update BMP today.   Dyslipidemia Doing well with atorvastatin. Continue at current dose.    Meds ordered this encounter  Medications  . quinapril (ACCUPRIL) 40 MG tablet    Sig: Take 1 tablet (40 mg total) by mouth daily.    Dispense:  90 tablet    Refill:  3    Return in about 6 months (around 07/03/2021) for HTN/Hypothyroid/HLD.    This visit occurred during the SARS-CoV-2 public health emergency.  Safety protocols were in place, including screening questions prior to the visit, additional usage of staff PPE, and extensive cleaning of exam room while observing appropriate contact time as indicated for disinfecting solutions.

## 2020-12-31 NOTE — Patient Instructions (Signed)
Great to see you today! Have labs completed.  Continue current medications.

## 2020-12-31 NOTE — Assessment & Plan Note (Signed)
Managed by rheumatology.  Stable at this time.

## 2021-01-01 LAB — BASIC METABOLIC PANEL
BUN/Creatinine Ratio: 11 (calc) (ref 6–22)
BUN: 15 mg/dL (ref 7–25)
CO2: 28 mmol/L (ref 20–32)
Calcium: 10.1 mg/dL (ref 8.6–10.4)
Chloride: 106 mmol/L (ref 98–110)
Creat: 1.36 mg/dL — ABNORMAL HIGH (ref 0.60–0.93)
Glucose, Bld: 97 mg/dL (ref 65–99)
Potassium: 4.6 mmol/L (ref 3.5–5.3)
Sodium: 140 mmol/L (ref 135–146)

## 2021-01-01 LAB — TSH: TSH: 1.42 mIU/L (ref 0.40–4.50)

## 2021-02-17 DIAGNOSIS — B0223 Postherpetic polyneuropathy: Secondary | ICD-10-CM | POA: Diagnosis not present

## 2021-02-17 DIAGNOSIS — Z7189 Other specified counseling: Secondary | ICD-10-CM | POA: Diagnosis not present

## 2021-02-17 DIAGNOSIS — Z6831 Body mass index (BMI) 31.0-31.9, adult: Secondary | ICD-10-CM | POA: Diagnosis not present

## 2021-02-17 DIAGNOSIS — E669 Obesity, unspecified: Secondary | ICD-10-CM | POA: Diagnosis not present

## 2021-02-17 DIAGNOSIS — M069 Rheumatoid arthritis, unspecified: Secondary | ICD-10-CM | POA: Diagnosis not present

## 2021-02-17 DIAGNOSIS — B0230 Zoster ocular disease, unspecified: Secondary | ICD-10-CM | POA: Diagnosis not present

## 2021-02-17 DIAGNOSIS — M150 Primary generalized (osteo)arthritis: Secondary | ICD-10-CM | POA: Diagnosis not present

## 2021-02-17 DIAGNOSIS — M353 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: Secondary | ICD-10-CM | POA: Diagnosis not present

## 2021-02-17 DIAGNOSIS — M8589 Other specified disorders of bone density and structure, multiple sites: Secondary | ICD-10-CM | POA: Diagnosis not present

## 2021-02-17 DIAGNOSIS — Z79899 Other long term (current) drug therapy: Secondary | ICD-10-CM | POA: Diagnosis not present

## 2021-02-23 ENCOUNTER — Encounter: Payer: Self-pay | Admitting: Family Medicine

## 2021-02-23 ENCOUNTER — Other Ambulatory Visit: Payer: Self-pay

## 2021-02-23 ENCOUNTER — Telehealth (INDEPENDENT_AMBULATORY_CARE_PROVIDER_SITE_OTHER): Payer: Medicare HMO | Admitting: Family Medicine

## 2021-02-23 ENCOUNTER — Other Ambulatory Visit: Payer: Self-pay | Admitting: Osteopathic Medicine

## 2021-02-23 DIAGNOSIS — U071 COVID-19: Secondary | ICD-10-CM | POA: Insufficient documentation

## 2021-02-23 DIAGNOSIS — N183 Chronic kidney disease, stage 3 unspecified: Secondary | ICD-10-CM

## 2021-02-23 DIAGNOSIS — J309 Allergic rhinitis, unspecified: Secondary | ICD-10-CM

## 2021-02-23 MED ORDER — NIRMATRELVIR/RITONAVIR (PAXLOVID) TABLET (RENAL DOSING): 2.0000 | ORAL_TABLET | Freq: Two times a day (BID) | ORAL | 0 refills | Status: AC

## 2021-02-23 MED ORDER — AMOXICILLIN 500 MG PO CAPS: 500.0000 mg | ORAL_CAPSULE | Freq: Three times a day (TID) | ORAL | 0 refills | Status: AC

## 2021-02-23 NOTE — Assessment & Plan Note (Signed)
Positive home test and symptoms consistent with COVID Starting Paxlovid, dosed renally due to CKD. She will hold trazodone and atorvastatin while taking this.  Recommend continued supportive care with increased fluids.  Will also add amoxicillin for sinusitis coverage.  Red flags discussed and recommend she be seen in emergency setting if having increasing dyspnea or inability to remain hydrated.

## 2021-02-23 NOTE — Progress Notes (Signed)
Melody Moore - 70 y.o. female MRN [id number]  Date of birth: 30-Nov-1950   This visit type was conducted due to national recommendations for restrictions regarding the COVID-19 Pandemic (e.g. social distancing).  This format is felt to be most appropriate for this patient at this time.  All issues noted in this document were discussed and addressed.  No physical exam was performed (except for noted visual exam findings with Video Visits).  I discussed the limitations of evaluation and management by telemedicine and the availability of in person appointments. The patient expressed understanding and agreed to proceed.  I connected with@ on 02/23/21 at 11:10 AM EDT by a video enabled telemedicine application and verified that I am speaking with the correct person using two identifiers.  Present at visit: Luetta Nutting, DO [name]   Patient Location: Home [address] [place] [address]   Provider location:   [place] Complaint  Patient presents with  . Covid Positive    HPI  Melody Moore is a 70 y.o. female who presents via audio/video conferencing for a telehealth visit today.  She has complaint of COVID-19.  She has symptoms of congestion, sinus pain/pressure, body aches, fatigue, and mild cough.  Symptoms started 4 days ago.  She has had mild fever.  She is on DMARDs for her RA.  Her rheumatologist has advised her to hold leflunomide infusion x1 week.   Denies chest pain or shortness of breath. She is eating and drinking normally.  She has had vaccination and booster for COVID.      ROS:  A comprehensive ROS was completed and negative except as noted per HPI  Past Medical History:  Diagnosis Date  . 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 AKI (acute kidney injury) ([place]) 02/2016  . Allergy   . Arthritis   . Carotid stenosis, asymptomatic, bilateral 05/26/2017   Dopplers 02/12/2016: right - mid-distal 40-59% stenosis; left - 40-59% stenosis  . Cataract   .  CKD (chronic kidney disease) stage 3, GFR 30-59 ml/min ([facility]) 11/02/2017  . Decreased calculated GFR 06/15/2017  . Gallstones 06/22/2017  . History of echocardiogram 11/07/2017   04/09/2016: EF 32%, mild diastolic dysfunction, trivial MR and TR  . Hypertension   . Hypothyroidism   . Immunosuppression due to drug therapy ([place]) 11/02/2017  . Rheumatoid arthritis ([place])   . Rheumatoid arthritis involving multiple sites ([place]) 05/26/2017  . Transaminitis 06/15/2017   Normal abdominal US 06/22/17    Past Surgical History:  Procedure Laterality Date  . APPENDECTOMY    . CATARACT EXTRACTION, BILATERAL  2015  . 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  . TOTAL KNEE ARTHROPLASTY Right 12/16/2017   Procedure: RIGHT TOTAL KNEE ARTHROPLASTY;  Surgeon: Dorna Leitz, MD;  Location: [facility];  Service: Orthopedics;  Laterality: Right;  Adductor Block    Family History  Problem Relation Age of Onset  . Diabetes Mother   . Lung cancer Father   . Hypertension Father   . Uterine cancer Sister     Social History   Socioeconomic History  . Marital status: Widowed    Spouse name: Not on file  . Number of children: 0  . Years of education: 16  . Highest education level: Bachelor's degree (e.g., BA, AB, BS)  Occupational History  . Occupation: [employer]    Comment: retired  Tobacco Use  . Smoking status: Former Smoker    Types: Cigarettes    Quit date: 02/07/1974    Years  since quitting: 47.0  . Smokeless tobacco: Never Used  Vaping Use  . Vaping Use: Never used  Substance and Sexual Activity  . Alcohol use: Yes    Comment: occasionally  . Drug use: No  . Sexual activity: Yes    Partners: Male    Birth control/protection: Post-menopausal  Other Topics Concern  . Not on file  Social History Narrative   Walks the dog everyday. Runs errands. Caffeine use 2 cups of coffee   Social Determinants of Health   Financial Resource Strain: Not on file  Food Insecurity: Not on file   Transportation Needs: Not on file  Physical Activity: Not on file  Stress: Not on file  Social Connections: Not on file  Intimate Partner Violence: Not on file     Current Outpatient Medications:  .  acetaminophen (TYLENOL) 650 MG CR tablet, Take 2 tablets (1,300 mg total) by mouth 2 (two) times daily as needed for pain., Disp: , Rfl:  .  amoxicillin (AMOXIL) 500 MG capsule, Take 1 capsule (500 mg total) by mouth 3 (three) times daily for 10 days., Disp: 30 capsule, Rfl: 0 .  aspirin EC 81 MG tablet, Take 81 mg by mouth daily., Disp: , Rfl:  .  atorvastatin (LIPITOR) 40 MG tablet, TAKE 1 TABLET BY MOUTH EVERY DAY, Disp: 90 tablet, Rfl: 3 .  Calcium Carb-Cholecalciferol (CALCIUM 600 + D PO), Take 2 tablets by mouth daily., Disp: , Rfl:  .  Cholecalciferol (VITAMIN D) 2000 units tablet, Take 2,000 Units by mouth daily., Disp: , Rfl:  .  docusate sodium (COLACE) 100 MG capsule, Take 100 mg by mouth 2 (two) times daily., Disp: , Rfl:  .  EPINEPHrine (EPIPEN 2-PAK) 0.3 mg/0.3 mL IJ SOAJ injection, Inject 0.3 mLs (0.3 mg total) into the muscle once as needed for up to 1 dose., Disp: 2 each, Rfl: 3 .  ferrous sulfate 325 (65 FE) MG tablet, Take 325 mg by mouth 2 (two) times daily., Disp: , Rfl:  .  gabapentin (NEURONTIN) 300 MG capsule, TAKE 1 CAPSULE BY MOUTH EVERYDAY AT BEDTIME, Disp: 90 capsule, Rfl: 3 .  hydroxychloroquine (PLAQUENIL) 200 MG tablet, Take 200 mg by mouth 2 (two) times daily. , Disp: , Rfl:  .  leflunomide (ARAVA) 20 MG tablet, Take 1 tablet by mouth daily., Disp: , Rfl:  .  leucovorin (WELLCOVORIN) 5 MG tablet, Take 5 mg by mouth daily. , Disp: , Rfl:  .  levothyroxine (SYNTHROID) 50 MCG tablet, TAKE 1 TABLET BY MOUTH DAILY BEFORE BREAKFAST, Disp: 90 tablet, Rfl: 3 .  nirmatrelvir/ritonavir EUA, renal dosing, (PAXLOVID) TABS, Take 2 tablets by mouth 2 (two) times daily for 5 days. Patient GFR is 45. Take nirmatrelvir (150 mg) one tablet twice daily for 5 days and ritonavir (100  mg) one tablet twice daily for 5 days., Disp: 20 tablet, Rfl: 0 .  quinapril (ACCUPRIL) 40 MG tablet, Take 1 tablet (40 mg total) by mouth daily., Disp: 90 tablet, Rfl: 3 .  traZODone (DESYREL) 50 MG tablet, TAKE 1 TO 2 TABLETS BY MOUTH AT BEDTIME AS NEEDED, Disp: 180 tablet, Rfl: 0 .  triamcinolone ointment (KENALOG) 0.5 %, Apply 1 application topically daily as needed., Disp: , Rfl:  .  amLODipine (NORVASC) 10 MG tablet, Take 1 tablet (10 mg total) by mouth daily., Disp: 90 tablet, Rfl: 3 .  fluticasone (FLONASE) 50 MCG/ACT nasal spray, SPRAY 2 SPRAYS INTO EACH NOSTRIL EVERY DAY, Disp: 48 mL, Rfl: 6  EXAM:  VITALS  per patient if applicable: Temp 00.8 F (36.9 C)   Ht 5\' 3"  (1.6 m)   Wt 194 lb (88 kg)   BMI 34.37 kg/m   GENERAL: alert, oriented, appears well and in no acute distress  HEENT: atraumatic, conjunttiva clear, no obvious abnormalities on inspection of external nose and ears  NECK: normal movements of the head and neck  LUNGS: on inspection no signs of respiratory distress, breathing rate appears normal, no obvious gross SOB, gasping or wheezing  CV: no obvious cyanosis  MS: moves all visible extremities without noticeable abnormality  PSYCH/NEURO: pleasant and cooperative, no obvious depression or anxiety, speech and thought processing grossly intact  ASSESSMENT AND PLAN:  Discussed the following assessment and plan:  COVID-19 Positive home test and symptoms consistent with COVID Starting Paxlovid, dosed renally due to CKD. She will hold trazodone and atorvastatin while taking this.  Recommend continued supportive care with increased fluids.  Will also add amoxicillin for sinusitis coverage.  Red flags discussed and recommend she be seen in emergency setting if having increasing dyspnea or inability to remain hydrated.       I discussed the assessment and treatment plan with the patient. The patient was provided an opportunity to ask questions and all were  answered. The patient agreed with the plan and demonstrated an understanding of the instructions.   The patient was advised to call back or seek an in-person evaluation if the symptoms worsen or if the condition fails to improve as anticipated.    Luetta Nutting, DO

## 2021-03-01 ENCOUNTER — Encounter: Payer: Self-pay | Admitting: Family Medicine

## 2021-03-02 ENCOUNTER — Other Ambulatory Visit: Payer: Self-pay | Admitting: Family Medicine

## 2021-03-02 ENCOUNTER — Encounter: Payer: Self-pay | Admitting: Family Medicine

## 2021-03-10 ENCOUNTER — Telehealth: Payer: Self-pay | Admitting: Family Medicine

## 2021-03-10 NOTE — Chronic Care Management (AMB) (Signed)
  Chronic Care Management   Note  03/10/2021 Name: Melody Moore MRN: [id number] DOB: Sep 23, 1951  Melody Moore is a 70 y.o. year old female who is a primary care patient of Luetta Nutting, DO. I reached out to [name] by phone today in response to a referral sent by [name] PCP, Luetta Nutting, DO.   [name] was given information about Chronic Care Management services today including:  1. CCM service includes personalized support from designated clinical staff supervised by her physician, including individualized plan of care and coordination with other care providers 2. 24/7 contact phone numbers for assistance for urgent and routine care needs. 3. Service will only be billed when office clinical staff spend 20 minutes or more in a month to coordinate care. 4. Only one practitioner may furnish and bill the service in a calendar month. 5. The patient may stop CCM services at any time (effective at the end of the month) by phone call to the office staff.   Patient agreed to services and verbal consent obtained.   Follow up plan:   Lauretta Grill Up[employer] Scheduler

## 2021-03-10 NOTE — Chronic Care Management (AMB) (Signed)
  Chronic Care Management   Note  03/10/2021 Name: Melody Moore MRN: [id number] DOB: [birth year]  Melody Moore is a 70 y.o. year old female who is a primary care patient of Luetta Nutting, DO. I reached out to [name] by phone today in response to a referral sent by [name] PCP, Luetta Nutting, DO.   [name] was given information about Chronic Care Management services today including:  1. CCM service includes personalized support from designated clinical staff supervised by her physician, including individualized plan of care and coordination with other care providers 2. 24/7 contact phone numbers for assistance for urgent and routine care needs. 3. Service will only be billed when office clinical staff spend 20 minutes or more in a month to coordinate care. 4. Only one practitioner may furnish and bill the service in a calendar month. 5. The patient may stop CCM services at any time (effective at the end of the month) by phone call to the office staff.   Patient agreed to services and verbal consent obtained.   Follow up plan:   Lauretta Grill Up[employer] Scheduler

## 2021-03-20 ENCOUNTER — Other Ambulatory Visit: Payer: Self-pay | Admitting: Family Medicine

## 2021-03-20 DIAGNOSIS — Z780 Asymptomatic menopausal state: Secondary | ICD-10-CM

## 2021-03-20 DIAGNOSIS — Z1231 Encounter for screening mammogram for malignant neoplasm of breast: Secondary | ICD-10-CM

## 2021-04-16 ENCOUNTER — Telehealth: Payer: Self-pay | Admitting: Pharmacist

## 2021-04-16 NOTE — Chronic Care Management (AMB) (Signed)
Chronic Care Management Pharmacy Assistant   Name: Melody Moore  MRN: [id number] DOB: 06/05/1951  Melody Moore is an 70 y.o. year old female who presents for her initial CCM visit with the clinical pharmacist.  Reason for Encounter: Initial CCM Visit  Recent office visits:  02/23/21- Luetta Nutting, DO (Video Visit)- seen for Covid- 19, short course amoxicillin three times daily x 10 days, short course nirmatrelvir- ritonavir as directed renally dosed due to CKD hold trazodone and atorvastatin while taking paxlovid, no follow up documented 12/31/20- Luetta Nutting, DO- seen for follow up of hypertension, labs ordered, no medication changes, follow up 6 months   Recent consult visits:  No visits noted   Hospital visits:  None in previous 6 months  Medications: Outpatient Encounter Medications as of 04/16/2021  Medication Sig   traZODone (DESYREL) 50 MG tablet TAKE 1-2 TABLETS BY MOUTH AT BEDTIME AS NEEDED   acetaminophen (TYLENOL) 650 MG CR tablet Take 2 tablets (1,300 mg total) by mouth 2 (two) times daily as needed for pain.   amLODipine (NORVASC) 10 MG tablet Take 1 tablet (10 mg total) by mouth daily.   aspirin EC 81 MG tablet Take 81 mg by mouth daily.   atorvastatin (LIPITOR) 40 MG tablet TAKE 1 TABLET BY MOUTH EVERY DAY   Calcium Carb-Cholecalciferol (CALCIUM 600 + D PO) Take 2 tablets by mouth daily.   Cholecalciferol (VITAMIN D) 2000 units tablet Take 2,000 Units by mouth daily.   docusate sodium (COLACE) 100 MG capsule Take 100 mg by mouth 2 (two) times daily.   EPINEPHrine (EPIPEN 2-PAK) 0.3 mg/0.3 mL IJ SOAJ injection Inject 0.3 mLs (0.3 mg total) into the muscle once as needed for up to 1 dose.   ferrous sulfate 325 (65 FE) MG tablet Take 325 mg by mouth 2 (two) times daily.   fluticasone (FLONASE) 50 MCG/ACT nasal spray SPRAY 2 SPRAYS INTO EACH NOSTRIL EVERY DAY   gabapentin (NEURONTIN) 300 MG capsule TAKE 1 CAPSULE BY MOUTH EVERYDAY AT BEDTIME    hydroxychloroquine (PLAQUENIL) 200 MG tablet Take 200 mg by mouth 2 (two) times daily.    leflunomide (ARAVA) 20 MG tablet Take 1 tablet by mouth daily.   leucovorin (WELLCOVORIN) 5 MG tablet Take 5 mg by mouth daily.    levothyroxine (SYNTHROID) 50 MCG tablet TAKE 1 TABLET BY MOUTH DAILY BEFORE BREAKFAST   quinapril (ACCUPRIL) 40 MG tablet Take 1 tablet (40 mg total) by mouth daily.   triamcinolone ointment (KENALOG) 0.5 % Apply 1 application topically daily as needed.   No facility-administered encounter medications on file as of 04/16/2021.   Current Documented Medications  traZODone  50 MG - 90 DS last filled 03/04/21 acetaminophen 650 MG CR  amLODipine  10 MG  - 90 DS last filled 01/26/21 aspirin EC 81 MG  Calcium Carb-Cholecalciferol Cholecalciferol  2000 units  docusate sodium 100 MG  EPINEPHrine  0.3 mg/0.3 mL IJ SOAJ injection ferrous sulfate 325  MG  fluticasone 50 MCG/ACT nasal spray- 90 DS last filled 02/23/21 gabapentin 300 MG- 90 DS last filled 03/20/21 hydroxychloroquine  200 MG - 90 DS last filled 03/11/21 leflunomide  20 MG - 90 DS last filled 02/08/21 leucovorin  5 MG - 90 DS last filled 02/10/21 levothyroxine  50 MCG - 90 DS last filled 01/25/21 triamcinolone ointment  0.5 %  Star Rating Drugs: atorvastatin  40 MG - 90 DS last filled 01/22/21 quinapril 40 MG - 90 DS last filled 12/31/20  Wilford Sports CPA,CMA

## 2021-04-22 ENCOUNTER — Ambulatory Visit (INDEPENDENT_AMBULATORY_CARE_PROVIDER_SITE_OTHER): Payer: Medicare HMO

## 2021-04-22 ENCOUNTER — Other Ambulatory Visit: Payer: Self-pay

## 2021-04-22 DIAGNOSIS — Z1231 Encounter for screening mammogram for malignant neoplasm of breast: Secondary | ICD-10-CM | POA: Diagnosis not present

## 2021-04-22 DIAGNOSIS — M85832 Other specified disorders of bone density and structure, left forearm: Secondary | ICD-10-CM | POA: Diagnosis not present

## 2021-04-22 DIAGNOSIS — Z780 Asymptomatic menopausal state: Secondary | ICD-10-CM

## 2021-04-22 DIAGNOSIS — M85852 Other specified disorders of bone density and structure, left thigh: Secondary | ICD-10-CM | POA: Diagnosis not present

## 2021-04-24 ENCOUNTER — Telehealth: Payer: Medicare HMO

## 2021-05-29 DIAGNOSIS — E669 Obesity, unspecified: Secondary | ICD-10-CM | POA: Diagnosis not present

## 2021-05-29 DIAGNOSIS — M150 Primary generalized (osteo)arthritis: Secondary | ICD-10-CM | POA: Diagnosis not present

## 2021-05-29 DIAGNOSIS — B0223 Postherpetic polyneuropathy: Secondary | ICD-10-CM | POA: Diagnosis not present

## 2021-05-29 DIAGNOSIS — B0230 Zoster ocular disease, unspecified: Secondary | ICD-10-CM | POA: Diagnosis not present

## 2021-05-29 DIAGNOSIS — M069 Rheumatoid arthritis, unspecified: Secondary | ICD-10-CM | POA: Diagnosis not present

## 2021-05-29 DIAGNOSIS — Z6830 Body mass index (BMI) 30.0-30.9, adult: Secondary | ICD-10-CM | POA: Diagnosis not present

## 2021-05-29 DIAGNOSIS — M353 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: Secondary | ICD-10-CM | POA: Diagnosis not present

## 2021-05-29 DIAGNOSIS — M8589 Other specified disorders of bone density and structure, multiple sites: Secondary | ICD-10-CM | POA: Diagnosis not present

## 2021-06-02 ENCOUNTER — Other Ambulatory Visit: Payer: Self-pay | Admitting: Medical-Surgical

## 2021-06-02 DIAGNOSIS — Z91030 Bee allergy status: Secondary | ICD-10-CM

## 2021-06-03 ENCOUNTER — Other Ambulatory Visit: Payer: Self-pay | Admitting: Family Medicine

## 2021-06-09 ENCOUNTER — Telehealth: Payer: Medicare HMO

## 2021-06-09 ENCOUNTER — Telehealth: Payer: Self-pay | Admitting: *Deleted

## 2021-06-09 NOTE — Chronic Care Management (AMB) (Signed)
  Care Management   Note  06/09/2021 Name: Monzerath Pendell MRN: [id number] DOB: 1950-11-21  Elleen Osterkamp is a 70 y.o. year old female who is a primary care patient of Luetta Nutting, DO and is actively engaged with the care management team. I reached out to [name] by phone today to assist with re-scheduling an initial visit with the Pharmacist  Follow up plan: Unsuccessful telephone outreach attempt made. A HIPAA compliant phone message was left for the patient providing contact information and requesting a return call.   Julian Hy, [facility] Management  Direct Dial: [phone]

## 2021-06-10 NOTE — Chronic Care Management (AMB) (Signed)
  Care Management   Note  06/10/2021 Name: Asuzena Volcy MRN: [id number] DOB: Jan 22, 1951  [name] is a 70 y.o. year old female who is a primary care patient of Luetta Nutting, DO and is actively engaged with the care management team. I reached out to [name] by phone today to assist with re-scheduling an initial visit with the Pharmacist  Follow up plan: Patient declines further follow up and engagement by the care management team. Appropriate care team members and provider have been notified via electronic communication.   Julian Hy, [facility]  Direct Dial: [phone]

## 2021-06-27 ENCOUNTER — Ambulatory Visit (INDEPENDENT_AMBULATORY_CARE_PROVIDER_SITE_OTHER): Payer: Medicare HMO | Admitting: *Deleted

## 2021-06-27 DIAGNOSIS — Z0000 Encounter for general adult medical examination without abnormal findings: Secondary | ICD-10-CM

## 2021-06-27 NOTE — Patient Instructions (Addendum)
Melody Moore , Thank you for taking time to come for your Medicare Wellness Visit. I appreciate your ongoing commitment to your health goals. Please review the following plan we discussed and let me know if I can assist you in the future.   Screening recommendations/referrals: Colonoscopy: next due 04/24/2027 Mammogram: next due 04/23/2023 Bone Density: up to date Recommended yearly ophthalmology/optometry visit for glaucoma screening and checkup Recommended yearly dental visit for hygiene and checkup  Vaccinations: Influenza vaccine: up to date Pneumococcal vaccine: up to date  Tdap vaccine: Due 03/25/2027 Shingles vaccine: up to date   Covid-19:up to date  Advanced directives: complete   Conditions/risks identified: none  Next appointment: Follow up in one year for your annual wellness visit    Preventive Care 16 Years and Older, Female Preventive care refers to lifestyle choices and visits with your health care provider that can promote health and wellness. What does preventive care include? A yearly physical exam. This is also called an annual well check. Dental exams once or twice a year. Routine eye exams. Ask your health care provider how often you should have your eyes checked. Personal lifestyle choices, including: Daily care of your teeth and gums. Regular physical activity. Eating a healthy diet. Avoiding tobacco and drug use. Limiting alcohol use. Practicing safe sex. Taking low-dose aspirin every day. Taking vitamin and mineral supplements as recommended by your health care provider. What happens during an annual well check? The services and screenings done by your health care provider during your annual well check will depend on your age, overall health, lifestyle risk factors, and family history of disease. Counseling  Your health care provider may ask you questions about your: Alcohol use. Tobacco use. Drug use. Emotional well-being. Home and relationship  well-being. Sexual activity. Eating habits. History of falls. Memory and ability to understand (cognition). Work and work Statistician. Reproductive health. Screening  You may have the following tests or measurements: Height, weight, and BMI. Blood pressure. Lipid and cholesterol levels. These may be checked every 5 years, or more frequently if you are over 43 years old. Skin check. Lung cancer screening. You may have this screening every year starting at age 56 if you have a 30-pack-year history of smoking and currently smoke or have quit within the past 15 years. Fecal occult blood test (FOBT) of the stool. You may have this test every year starting at age 23. Flexible sigmoidoscopy or colonoscopy. You may have a sigmoidoscopy every 5 years or a colonoscopy every 10 years starting at age 53. Hepatitis C blood test. Hepatitis B blood test. Sexually transmitted disease (STD) testing. Diabetes screening. This is done by checking your blood sugar (glucose) after you have not eaten for a while (fasting). You may have this done every 1-3 years. Bone density scan. This is done to screen for osteoporosis. You may have this done starting at age 51. Mammogram. This may be done every 1-2 years. Talk to your health care provider about how often you should have regular mammograms. Talk with your health care provider about your test results, treatment options, and if necessary, the need for more tests. Vaccines  Your health care provider may recommend certain vaccines, such as: Influenza vaccine. This is recommended every year. Tetanus, diphtheria, and acellular pertussis (Tdap, Td) vaccine. You may need a Td booster every 10 years. Zoster vaccine. You may need this after age 76. Pneumococcal 13-valent conjugate (PCV13) vaccine. One dose is recommended after age 31. Pneumococcal polysaccharide (PPSV23) vaccine. One dose  is recommended after age 103. Talk to your health care provider about which  screenings and vaccines you need and how often you need them. This information is not intended to replace advice given to you by your health care provider. Make sure you discuss any questions you have with your health care provider. Document Released: 10/17/2015 Document Revised: 06/09/2016 Document Reviewed: 07/22/2015 Elsevier Interactive Patient Education  2017 [employer] Prevention in the Home Falls can cause injuries. They can happen to people of all ages. There are many things you can do to make your home safe and to help prevent falls. What can I do on the outside of my home? Regularly fix the edges of walkways and driveways and fix any cracks. Remove anything that might make you trip as you walk through a door, such as a raised step or threshold. Trim any bushes or trees on the path to your home. Use bright outdoor lighting. Clear any walking paths of anything that might make someone trip, such as rocks or tools. Regularly check to see if handrails are loose or broken. Make sure that both sides of any steps have handrails. Any raised decks and porches should have guardrails on the edges. Have any leaves, snow, or ice cleared regularly. Use sand or salt on walking paths during winter. Clean up any spills in your garage right away. This includes oil or grease spills. What can I do in the bathroom? Use night lights. Install grab bars by the toilet and in the tub and shower. Do not use towel bars as grab bars. Use non-skid mats or decals in the tub or shower. If you need to sit down in the shower, use a plastic, non-slip stool. Keep the floor dry. Clean up any water that spills on the floor as soon as it happens. Remove soap buildup in the tub or shower regularly. Attach bath mats securely with double-sided non-slip rug tape. Do not have throw rugs and other things on the floor that can make you trip. What can I do in the bedroom? Use night lights. Make sure that you have a  light by your bed that is easy to reach. Do not use any sheets or blankets that are too big for your bed. They should not hang down onto the floor. Have a firm chair that has side arms. You can use this for support while you get dressed. Do not have throw rugs and other things on the floor that can make you trip. What can I do in the kitchen? Clean up any spills right away. Avoid walking on wet floors. Keep items that you use a lot in easy-to-reach places. If you need to reach something above you, use a strong step stool that has a grab bar. Keep electrical cords out of the way. Do not use floor polish or wax that makes floors slippery. If you must use wax, use non-skid floor wax. Do not have throw rugs and other things on the floor that can make you trip. What can I do with my stairs? Do not leave any items on the stairs. Make sure that there are handrails on both sides of the stairs and use them. Fix handrails that are broken or loose. Make sure that handrails are as long as the stairways. Check any carpeting to make sure that it is firmly attached to the stairs. Fix any carpet that is loose or worn. Avoid having throw rugs at the top or bottom of the stairs.  If you do have throw rugs, attach them to the floor with carpet tape. Make sure that you have a light switch at the top of the stairs and the bottom of the stairs. If you do not have them, ask someone to add them for you. What else can I do to help prevent falls? Wear shoes that: Do not have high heels. Have rubber bottoms. Are comfortable and fit you well. Are closed at the toe. Do not wear sandals. If you use a stepladder: Make sure that it is fully opened. Do not climb a closed stepladder. Make sure that both sides of the stepladder are locked into place. Ask someone to hold it for you, if possible. Clearly mark and make sure that you can see: Any grab bars or handrails. First and last steps. Where the edge of each step  is. Use tools that help you move around (mobility aids) if they are needed. These include: Canes. Walkers. Scooters. Crutches. Turn on the lights when you go into a dark area. Replace any light bulbs as soon as they burn out. Set up your furniture so you have a clear path. Avoid moving your furniture around. If any of your floors are uneven, fix them. If there are any pets around you, be aware of where they are. Review your medicines with your doctor. Some medicines can make you feel dizzy. This can increase your chance of falling. Ask your doctor what other things that you can do to help prevent falls. This information is not intended to replace advice given to you by your health care provider. Make sure you discuss any questions you have with your health care provider. Document Released: 07/17/2009 Document Revised: 02/26/2016 Document Reviewed: 10/25/2014 Elsevier Interactive Patient Education  2017 [employer].

## 2021-06-27 NOTE — Progress Notes (Signed)
Subjective:   Melody Moore is a 70 y.o. female who presents for Medicare Annual (Subsequent) preventive examination.  Virtual Visit via Telephone Note  I connected with  Melody Moore on 06/27/21 at  9:00 AM EDT by telephone and verified that I am speaking with the correct person using two identifiers.  Location: Patient: home Provider: office Persons participating in the virtual visit: patient/Nurse Health Advisor   I discussed the limitations, risks, security and privacy concerns of performing an evaluation and management service by telephone and the availability of in person appointments. The patient expressed understanding and agreed to proceed.  We continued and completed visit with audio only.  Some vital signs may be absent or patient reported.   Julian Hy T, CMA  Cardiac Risk Factors include: advanced age (>33men, >71 women)     Objective:    There were no vitals filed for this visit. There is no height or weight on file to calculate BMI.  Advanced Directives 06/27/2021 03/24/2020 12/19/2018 06/09/2018 01/09/2018 12/23/2017 12/16/2017  Does Patient Have a Medical Advance Directive? Yes Yes Yes Yes Yes Yes No  Type of Paramedic of [place];Living will Healthcare Power of [place];Living will [place];Living will [place];Living will [place] -  Does patient want to make changes to medical advance directive? No - Patient declined Yes (MAU/Ambulatory/Procedural Areas - Information given) No - Patient declined - - No - Patient declined -  Copy of [place] in Chart? Yes - validated most recent copy scanned in chart (See row information) Yes - validated most recent copy scanned in chart (See row information) Yes - validated most recent copy scanned in chart (See row information) Yes Yes No - copy requested -  Would patient like information on  creating a medical advance directive? - - - - - - No - Patient declined    Current Medications (verified) Outpatient Encounter Medications as of 06/27/2021  Medication Sig   acetaminophen (TYLENOL) 650 MG CR tablet Take 2 tablets (1,300 mg total) by mouth 2 (two) times daily as needed for pain.   amLODipine (NORVASC) 10 MG tablet Take 1 tablet (10 mg total) by mouth daily.   aspirin EC 81 MG tablet Take 81 mg by mouth daily.   atorvastatin (LIPITOR) 40 MG tablet TAKE 1 TABLET BY MOUTH EVERY DAY   Calcium Carb-Cholecalciferol (CALCIUM 600 + D PO) Take 2 tablets by mouth daily.   Cholecalciferol (VITAMIN D) 2000 units tablet Take 2,000 Units by mouth daily.   docusate sodium (COLACE) 100 MG capsule Take 100 mg by mouth daily.   EPINEPHRINE 0.3 mg/0.3 mL IJ SOAJ injection INJECT 0.3 MLS (0.3 MG TOTAL) INTO THE MUSCLE ONCE AS NEEDED FOR UP TO 1 DOSE.   ferrous sulfate 325 (65 FE) MG tablet Take 325 mg by mouth daily with breakfast.   fluticasone (FLONASE) 50 MCG/ACT nasal spray SPRAY 2 SPRAYS INTO EACH NOSTRIL EVERY DAY (Patient taking differently: Place 1 spray into both nostrils.)   gabapentin (NEURONTIN) 300 MG capsule TAKE 1 CAPSULE BY MOUTH EVERYDAY AT BEDTIME   hydroxychloroquine (PLAQUENIL) 200 MG tablet Take 200 mg by mouth 2 (two) times daily.    leflunomide (ARAVA) 20 MG tablet Take 1 tablet by mouth daily.   leucovorin (WELLCOVORIN) 5 MG tablet Take 5 mg by mouth daily.    levothyroxine (SYNTHROID) 50 MCG tablet TAKE 1 TABLET BY MOUTH DAILY BEFORE BREAKFAST  quinapril (ACCUPRIL) 40 MG tablet Take 1 tablet (40 mg total) by mouth daily.   traMADol (ULTRAM) 50 MG tablet Take 50 mg by mouth every 6 (six) hours as needed.   traZODone (DESYREL) 50 MG tablet TAKE 1 TO 2 TABLETS BY MOUTH AT BEDTIME AS NEEDED   triamcinolone ointment (KENALOG) 0.5 % Apply 1 application topically daily as needed.   Turmeric (QC TUMERIC COMPLEX) 500 MG CAPS Take by mouth daily.   No facility-administered  encounter medications on file as of 06/27/2021.    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   AKI (acute kidney injury) ([place]) 02/2016   Allergy    Arthritis    Carotid stenosis, asymptomatic, bilateral 05/26/2017   Dopplers 02/12/2016: right - mid-distal 40-59% stenosis; left - 40-59% stenosis   Cataract    CKD (chronic kidney disease) stage 3, GFR 30-59 ml/min (HCC) 11/02/2017   Decreased calculated GFR 06/15/2017   Gallstones 06/22/2017   History of echocardiogram 11/07/2017   04/09/2016: EF 18%, mild diastolic dysfunction, trivial MR and TR   Hypertension    Hypothyroidism    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   Rheumatoid arthritis ([place])    Rheumatoid arthritis involving multiple sites ([place]) 05/26/2017   Transaminitis 06/15/2017   Normal abdominal US 06/22/17   Past Surgical History:  Procedure Laterality Date   APPENDECTOMY     CATARACT EXTRACTION, BILATERAL  2015   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   TOTAL KNEE ARTHROPLASTY Right 12/16/2017   Procedure: RIGHT TOTAL KNEE ARTHROPLASTY;  Surgeon: Dorna Leitz, MD;  Location: [facility];  Service: Orthopedics;  Laterality: Right;  Adductor Block   Family History  Problem Relation Age of Onset   Diabetes Mother    Lung cancer Father    Hypertension Father    Uterine cancer Sister    Social History   Socioeconomic History   Marital status: Married    Spouse name: Melody Moore   Number of children: 0   Years of education: 16   Highest education level: Bachelor's degree (e.g., BA, AB, BS)  Occupational History   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 Runs errands. Caffeine use 2 cups of coffee   Social Determinants of Health   Financial Resource Strain: Low Risk    Difficulty of Paying Living Expenses: Not hard at all  Food Insecurity: No Food Insecurity   Worried About Charity fundraiser in the Last Year: Never true   Ran Out of Food in the Last Year: Never true  Transportation Needs: No Transportation Needs   Lack of Transportation (Medical): No   Lack of Transportation (Non-Medical): No  Physical Activity: Sufficiently Active  Days of Exercise per Week: 7 days   Minutes of Exercise per Session: 40 min  Stress: No Stress Concern Present   Feeling of Stress : Not at all  Social Connections: Socially Integrated   Frequency of Communication with Friends and Family: More than three times a week   Frequency of Social Gatherings with Friends and Family: More than three times a week   Attends Religious Services: More than 4 times per year   Active Member of [employer] or Organizations: Yes   Attends Music therapist: More than 4 times per year   Marital Status: Married    Tobacco Counseling Counseling given: Not Answered   Clinical Intake:  Pre-visit preparation completed: Yes  Pain : No/denies pain     BMI - recorded:  (weight today is 185lbs) Nutritional Risks: None Diabetes: No  How often do you need to have someone help you when you read instructions, pamphlets, or other written materials from your doctor or pharmacy?: 1 - Never What is the last grade level you completed in school?: Graduated [employer]  Degree  Diabetic? No   Interpreter Needed?: No  Information entered by :: Marte Celani, CMA   Activities of Daily Living In your present state of health, do you have any difficulty performing the following activities: 06/27/2021  Hearing? N  Vision? N  Difficulty concentrating or making decisions? N  Walking or climbing stairs? N  Dressing or bathing? N  Doing errands, shopping? N  Preparing Food and eating ? N  Using the Toilet? N  In the past six months, have you accidently leaked urine? N  Do you have problems with loss of bowel control? N  Managing your Medications? N  Managing your Finances? N  Housekeeping or managing your Housekeeping? N  Some recent data might be hidden    Patient Care Team: Luetta Nutting, DO as PCP - General (Family Medicine) Rosita Kea, PA-C (Inactive) as Consulting Physician (Rheumatology)  Indicate any recent Medical Services you may have received from other than [facility] providers in the past year (date may be approximate).     Assessment:   This is a routine wellness examination for [place].  Hearing/Vision screen Denies any hearing problems no recent screening/wants referral to new ophthalmologist - wears OTC reading glasses - will discuss with pcp at upcoming appointment   Dietary issues and exercise activities discussed: Current Exercise Habits: Home exercise routine, Type of exercise: walking, Time (Minutes): 40, Frequency (Times/Week): 7, Weekly Exercise (Minutes/Week): 280, Intensity: Moderate, Exercise limited by: None identified   Goals Addressed             This Visit's Progress    Weight (lb) < 200 lb (90.7 kg)       Walks dog 30-40 mins daily with husband and actively losing weight and making better food choices not eating out a lot        Depression Screen PHQ 2/9 Scores 06/27/2021 06/27/2021 12/31/2020 03/24/2020 06/22/2019 12/19/2018 05/24/2018  PHQ - 2 Score 0 0 0 0 1 0 0  PHQ- 9 Score - - - 0 4 - -    Fall  Risk Fall Risk  06/27/2021 12/31/2020 03/24/2020 01/11/2019 12/19/2018  Falls in the past year? 0 0 1 - 0  Number falls in past yr: 0 0 0 - -  Injury with Fall? - 0 0 - -  Risk for fall due to : (No Data) No Fall Risks - - -  Risk  for fall due to: Comment no barriers - - - -  Follow up Falls prevention discussed Falls evaluation completed Falls evaluation completed Education provided Falls prevention discussed    FALL RISK PREVENTION PERTAINING TO THE HOME:  Any stairs in or around the home? Yes  If so, are there any without handrails? No  Home free of loose throw rugs in walkways, pet beds, electrical cords, etc? Yes  Adequate lighting in your home to reduce risk of falls? Yes   ASSISTIVE DEVICES UTILIZED TO PREVENT FALLS:  Life alert? No  Use of a cane, walker or w/c? No  Grab bars in the bathroom? Yes  Shower chair or bench in shower? No  Elevated toilet seat or a handicapped toilet? No   TIMED UP AND GO:  Was the test performed? No .  Telephone appointment   Cognitive Function:     6CIT Screen 06/27/2021 03/24/2020 12/19/2018  What Year? 0 points 0 points 0 points  What month? 0 points 0 points 0 points  What time? 0 points 0 points 0 points  Count back from 20 0 points 0 points 2 points  Months in reverse - 0 points 0 points  Repeat phrase - 0 points 0 points  Total Score - 0 2    Immunizations Immunization History  Administered Date(s) Administered   Fluad Quad(high Dose 65+) 07/03/2020   Influenza, High Dose Seasonal PF 05/26/2017   PFIZER(Purple Top)SARS-COV-2 Vaccination 12/03/2019, 01/01/2020   Tdap 03/24/2017    TDAP status: Up to date  Flu Vaccine status: Up to date  Pneumococcal vaccine status: Up to date  Covid-19 vaccine status: Completed vaccines  Qualifies for Shingles Vaccine? Yes   Zostavax completed No   Shingrix Completed?: Yes  Screening Tests Health Maintenance  Topic Date Due   Zoster Vaccines- Shingrix (1 of 2) Never done   COVID-19  Vaccine (3 - Pfizer risk series) 01/29/2020   INFLUENZA VACCINE  05/04/2021   MAMMOGRAM  04/23/2023   TETANUS/TDAP  03/25/2027   COLONOSCOPY (Pts 45-4yrs Insurance coverage will need to be confirmed)  04/04/2027   DEXA SCAN  Completed   Hepatitis C Screening  Completed   HPV VACCINES  Aged Out    Health Maintenance  Health Maintenance Due  Topic Date Due   Zoster Vaccines- Shingrix (1 of 2) Never done   COVID-19 Vaccine (3 - Pfizer risk series) 01/29/2020   INFLUENZA VACCINE  05/04/2021    Colorectal cancer screening: Type of screening: Colonoscopy. Completed 03/18/2017. Repeat every 10 years  Mammogram status: Completed 04/22/2021. Repeat every 2 years   Lung Cancer Screening: (Low Dose CT Chest recommended if Age 27-80 years, 30 pack-year currently smoking OR have quit w/in 15years.) does not qualify.    Additional Screening:  Hepatitis C Screening: does qualify; Completed 07/03/2020  Vision Screening: Recommended annual ophthalmology exams for early detection of glaucoma and other disorders of the eye. Is the patient up to date with their annual eye exam?  Yes  Who is the provider or what is the name of the office in which the patient attends annual eye exams? [facility]   Dental Screening: Recommended annual dental exams for proper oral hygiene  Community Resource Referral / Chronic Care Management: CRR required this visit?  No   CCM required this visit?  No      Plan:     I have personally reviewed and noted the following in the patient's chart:   Medical and social history Use of  alcohol, tobacco or illicit drugs  Current medications and supplements including opioid prescriptions.  Functional ability and status Nutritional status Physical activity Advanced directives List of other physicians Hospitalizations, surgeries, and ER visits in previous 12 months Vitals Screenings to include cognitive, depression, and falls Referrals and  appointments  In addition, I have reviewed and discussed with patient certain preventive protocols, quality metrics, and best practice recommendations. A written personalized care plan for preventive services as well as general preventive health recommendations were provided to patient.     Massie Maroon, CMA   06/27/2021   Nurse Notes: none - Non face to face 60 minute visit

## 2021-07-03 ENCOUNTER — Encounter: Payer: Self-pay | Admitting: Family Medicine

## 2021-07-03 ENCOUNTER — Other Ambulatory Visit: Payer: Self-pay

## 2021-07-03 ENCOUNTER — Ambulatory Visit (INDEPENDENT_AMBULATORY_CARE_PROVIDER_SITE_OTHER): Payer: Medicare HMO | Admitting: Family Medicine

## 2021-07-03 VITALS — BP 157/61 | HR 72 | Temp 97.4°F | Ht 63.0 in | Wt 191.0 lb

## 2021-07-03 DIAGNOSIS — M069 Rheumatoid arthritis, unspecified: Secondary | ICD-10-CM | POA: Diagnosis not present

## 2021-07-03 DIAGNOSIS — N183 Hypertensive chronic kidney disease with stage 1 through stage 4 chronic kidney disease, or unspecified chronic kidney disease: Secondary | ICD-10-CM

## 2021-07-03 DIAGNOSIS — I10 Essential (primary) hypertension: Secondary | ICD-10-CM | POA: Diagnosis not present

## 2021-07-03 DIAGNOSIS — N1831 Chronic kidney disease, stage 3a: Secondary | ICD-10-CM

## 2021-07-03 DIAGNOSIS — E785 Hyperlipidemia, unspecified: Secondary | ICD-10-CM

## 2021-07-03 DIAGNOSIS — E039 Hypothyroidism, unspecified: Secondary | ICD-10-CM

## 2021-07-03 DIAGNOSIS — I129 Hypertensive chronic kidney disease with stage 1 through stage 4 chronic kidney disease, or unspecified chronic kidney disease: Secondary | ICD-10-CM

## 2021-07-03 DIAGNOSIS — M150 Primary generalized (osteo)arthritis: Secondary | ICD-10-CM | POA: Insufficient documentation

## 2021-07-03 DIAGNOSIS — B0223 Postherpetic polyneuropathy: Secondary | ICD-10-CM | POA: Insufficient documentation

## 2021-07-03 DIAGNOSIS — M353 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: Secondary | ICD-10-CM | POA: Insufficient documentation

## 2021-07-04 LAB — CBC WITH DIFFERENTIAL/PLATELET
Absolute Monocytes: 482 cells/uL (ref 200–950)
Basophils Absolute: 59 cells/uL (ref 0–200)
Basophils Relative: 1.3 %
Eosinophils Absolute: 153 cells/uL (ref 15–500)
Eosinophils Relative: 3.4 %
HCT: 42.8 % (ref 35.0–45.0)
Hemoglobin: 14.1 g/dL (ref 11.7–15.5)
Lymphs Abs: 1350 cells/uL (ref 850–3900)
MCH: 31 pg (ref 27.0–33.0)
MCHC: 32.9 g/dL (ref 32.0–36.0)
MCV: 94.1 fL (ref 80.0–100.0)
MPV: 11.9 fL (ref 7.5–12.5)
Monocytes Relative: 10.7 %
Neutro Abs: 2457 cells/uL (ref 1500–7800)
Neutrophils Relative %: 54.6 %
Platelets: 198 10*3/uL (ref 140–400)
RBC: 4.55 10*6/uL (ref 3.80–5.10)
RDW: 11.5 % (ref 11.0–15.0)
Total Lymphocyte: 30 %
WBC: 4.5 10*3/uL (ref 3.8–10.8)

## 2021-07-04 LAB — COMPLETE METABOLIC PANEL WITH GFR
AG Ratio: 2.2 (calc) (ref 1.0–2.5)
ALT: 20 U/L (ref 6–29)
AST: 26 U/L (ref 10–35)
Albumin: 4.6 g/dL (ref 3.6–5.1)
Alkaline phosphatase (APISO): 63 U/L (ref 37–153)
BUN/Creatinine Ratio: 14 (calc) (ref 6–22)
BUN: 18 mg/dL (ref 7–25)
CO2: 27 mmol/L (ref 20–32)
Calcium: 10 mg/dL (ref 8.6–10.4)
Chloride: 103 mmol/L (ref 98–110)
Creat: 1.27 mg/dL — ABNORMAL HIGH (ref 0.60–1.00)
Globulin: 2.1 g/dL (calc) (ref 1.9–3.7)
Glucose, Bld: 87 mg/dL (ref 65–99)
Potassium: 4.4 mmol/L (ref 3.5–5.3)
Sodium: 140 mmol/L (ref 135–146)
Total Bilirubin: 0.5 mg/dL (ref 0.2–1.2)
Total Protein: 6.7 g/dL (ref 6.1–8.1)
eGFR: 45 mL/min/{1.73_m2} — ABNORMAL LOW (ref >=60)

## 2021-07-04 LAB — LIPID PANEL W/REFLEX DIRECT LDL
Cholesterol: 176 mg/dL (ref <200)
HDL: 101 mg/dL (ref >=50)
LDL Cholesterol (Calc): 57 mg/dL (calc)
Non-HDL Cholesterol (Calc): 75 mg/dL (calc) (ref <130)
Total CHOL/HDL Ratio: 1.7 (calc) (ref <5.0)
Triglycerides: 95 mg/dL (ref <150)

## 2021-07-04 LAB — VITAMIN D 25 HYDROXY (VIT D DEFICIENCY, FRACTURES): Vit D, 25-Hydroxy: 68 ng/mL (ref 30–100)

## 2021-07-04 LAB — TSH: TSH: 1.04 mIU/L (ref 0.40–4.50)

## 2021-07-05 ENCOUNTER — Other Ambulatory Visit: Payer: Self-pay | Admitting: Family Medicine

## 2021-07-05 DIAGNOSIS — I6523 Occlusion and stenosis of bilateral carotid arteries: Secondary | ICD-10-CM

## 2021-07-05 NOTE — Progress Notes (Signed)
Melody Moore - 70 y.o. female MRN [id number]  Date of birth: 26-Dec-1950  Subjective Chief Complaint  Patient presents with   Follow-up    HPI Melody Moore is a 70 year old female here today for follow-up visit.  She reports that overall she is doing well.  She is due for annual lab work.  She continues to see rheumatology for management of RA.  Blood pressure remains well controlled with quinapril and amlodipine.  She is tolerating this well without side effects.  She has not had chest pain, shortness of breath, palpitations, headache or vision changes.  She continues on atorvastatin for management of hyperlipidemia.  Denies increased myalgias with this.  She feels good at current dose of levothyroxine.  She is sleeping fairly well with trazodone as needed.  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   AKI (acute kidney injury) ([place]) 02/2016   Allergy    Arthritis    Carotid stenosis, asymptomatic, bilateral 05/26/2017   Dopplers 02/12/2016: right - mid-distal 40-59% stenosis; left - 40-59% stenosis   Cataract    CKD (chronic kidney disease) stage 3, GFR 30-59 ml/min (HCC) 11/02/2017   Decreased calculated GFR 06/15/2017   Gallstones 06/22/2017   History of echocardiogram 11/07/2017   04/09/2016: EF 18%, mild diastolic dysfunction, trivial MR and TR   Hypertension    Hypothyroidism    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   Rheumatoid  arthritis ([place])    Rheumatoid arthritis involving multiple sites ([place]) 05/26/2017   Transaminitis 06/15/2017   Normal abdominal US 06/22/17    Past Surgical History:  Procedure Laterality Date   APPENDECTOMY     CATARACT EXTRACTION, BILATERAL  2015   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   TOTAL KNEE ARTHROPLASTY Right 12/16/2017   Procedure: RIGHT TOTAL KNEE ARTHROPLASTY;  Surgeon: Dorna Leitz, MD;  Location: [facility];  Service: Orthopedics;  Laterality: Right;  Adductor Block    Social History   Socioeconomic History   Marital status: Married    Spouse name: Fulton Mole   Number of children: 0   Years of education: 16   Highest education level: Bachelor's degree (e.g., BA, AB, BS)  Occupational History   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 Runs errands. Caffeine use 2 cups of coffee   Social Determinants of Health   Financial Resource Strain: Low Risk    Difficulty of Paying Living Expenses: Not hard at all  Food Insecurity: No Food Insecurity   Worried About Charity fundraiser in the Last Year: Never true   Ran Out of Food in the Last Year: Never true  Transportation Needs: No Transportation Needs   Lack of Transportation (Medical): No   Lack of Transportation (Non-Medical): No  Physical Activity: Sufficiently Active   Days of Exercise per Week: 7 days   Minutes of Exercise per Session: 40 min  Stress: No Stress Concern  Present   Feeling of Stress : Not at all  Social Connections: Socially Integrated   Frequency of Communication with Friends and Family: More than three times a week   Frequency of Social Gatherings with Friends and Family: More than three times a week   Attends Religious Services: More than 4 times per year   Active Member of Clubs or Organizations: Yes   Attends Music therapist: More than 4 times per year   Marital Status: Married    Family History  Problem Relation Age of Onset   Diabetes Mother    Lung cancer Father    Hypertension Father    Uterine cancer Sister     Health Maintenance  Topic Date Due   INFLUENZA VACCINE  05/04/2021   Zoster Vaccines- Shingrix (2 of 2) 05/28/2021   COVID-19 Vaccine (4 - Booster for Pfizer series) 06/25/2021   MAMMOGRAM  04/23/2023   TETANUS/TDAP  03/25/2027   COLONOSCOPY (Pts 45-7yrs Insurance coverage will need to be confirmed)  04/04/2027   DEXA SCAN  Completed   Hepatitis C Screening  Completed   HPV VACCINES  Aged Out     ----------------------------------------------------------------------------------------------------------------------------------------------------------------------------------------------------------------- Physical Exam BP (!) 157/61 (BP Location: Left Arm, Patient Position: Sitting, Cuff Size: Large)   Pulse 72   Temp (!) 97.4 F (36.3 C)   Ht 5\' 3"  (1.6 m)   Wt 191 lb (86.6 kg)   BMI 33.83 kg/m   Physical Exam Constitutional:      Appearance: Normal appearance.  HENT:     Head: Normocephalic and atraumatic.  Eyes:     General: No scleral icterus. Cardiovascular:     Rate and Rhythm: Normal rate and regular rhythm.  Pulmonary:     Effort: Pulmonary effort is normal.     Breath sounds: Normal breath sounds.  Musculoskeletal:     Cervical back: Neck supple.  Skin:    General: Skin is warm and dry.  Neurological:     General: No focal deficit present.     Mental Status: She is  alert.  Psychiatric:        Mood and Affect: Mood normal.        Behavior: Behavior normal.    ------------------------------------------------------------------------------------------------------------------------------------------------------------------------------------------------------------------- Assessment and Plan  Essential hypertension Blood pressure elevated today in clinic initially however repeat reading improved.  She will continue to monitor these closely at home.  She will continue current medications for now.  Acquired hypothyroidism Update TSH.  Chronic kidney disease, stage 3a ([place]) Update renal function today.  Rheumatoid arthritis involving multiple sites [facility]) Management per rheumatology.  Dyslipidemia Continues to do well with atorvastatin.  Update lipid panel.   No orders of the defined types were placed in this encounter.   Return in about 6 months (around 12/31/2021) for Thyroid.  This visit occurred during the SARS-CoV-2 public health emergency.  Safety protocols were in place, including screening questions prior to the visit, additional usage of staff PPE, and extensive cleaning of exam room while observing appropriate contact time as indicated for disinfecting solutions.

## 2021-07-05 NOTE — Assessment & Plan Note (Signed)
Update renal function today. 

## 2021-07-05 NOTE — Assessment & Plan Note (Signed)
Continues to do well with atorvastatin.  Update lipid panel.

## 2021-07-05 NOTE — Assessment & Plan Note (Signed)
Management per rheumatology.  

## 2021-07-05 NOTE — Assessment & Plan Note (Signed)
Update TSH

## 2021-07-05 NOTE — Assessment & Plan Note (Signed)
Blood pressure elevated today in clinic initially however repeat reading improved.  She will continue to monitor these closely at home.  She will continue current medications for now.

## 2021-07-15 ENCOUNTER — Other Ambulatory Visit: Payer: Self-pay | Admitting: Family Medicine

## 2021-08-04 DIAGNOSIS — M25561 Pain in right knee: Secondary | ICD-10-CM | POA: Diagnosis not present

## 2021-08-04 DIAGNOSIS — Z96652 Presence of left artificial knee joint: Secondary | ICD-10-CM | POA: Diagnosis not present

## 2021-08-04 DIAGNOSIS — Z96653 Presence of artificial knee joint, bilateral: Secondary | ICD-10-CM | POA: Diagnosis not present

## 2021-08-04 DIAGNOSIS — Z96651 Presence of right artificial knee joint: Secondary | ICD-10-CM | POA: Diagnosis not present

## 2021-09-02 ENCOUNTER — Other Ambulatory Visit: Payer: Self-pay | Admitting: Family Medicine

## 2021-09-16 DIAGNOSIS — B0230 Zoster ocular disease, unspecified: Secondary | ICD-10-CM | POA: Diagnosis not present

## 2021-09-16 DIAGNOSIS — M353 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: Secondary | ICD-10-CM | POA: Diagnosis not present

## 2021-09-16 DIAGNOSIS — B0223 Postherpetic polyneuropathy: Secondary | ICD-10-CM | POA: Diagnosis not present

## 2021-09-16 DIAGNOSIS — M069 Rheumatoid arthritis, unspecified: Secondary | ICD-10-CM | POA: Diagnosis not present

## 2021-09-16 DIAGNOSIS — Z6830 Body mass index (BMI) 30.0-30.9, adult: Secondary | ICD-10-CM | POA: Diagnosis not present

## 2021-09-16 DIAGNOSIS — E669 Obesity, unspecified: Secondary | ICD-10-CM | POA: Diagnosis not present

## 2021-09-16 DIAGNOSIS — M8589 Other specified disorders of bone density and structure, multiple sites: Secondary | ICD-10-CM | POA: Diagnosis not present

## 2021-09-16 DIAGNOSIS — M150 Primary generalized (osteo)arthritis: Secondary | ICD-10-CM | POA: Diagnosis not present

## 2021-09-16 DIAGNOSIS — N1831 Chronic kidney disease, stage 3a: Secondary | ICD-10-CM | POA: Diagnosis not present

## 2021-09-17 ENCOUNTER — Other Ambulatory Visit: Payer: Self-pay

## 2021-09-17 ENCOUNTER — Ambulatory Visit (INDEPENDENT_AMBULATORY_CARE_PROVIDER_SITE_OTHER): Payer: Medicare HMO | Admitting: Family Medicine

## 2021-09-17 VITALS — BP 181/60 | HR 90 | Temp 98.0°F | Ht 63.0 in | Wt 191.0 lb

## 2021-09-17 DIAGNOSIS — B3731 Acute candidiasis of vulva and vagina: Secondary | ICD-10-CM | POA: Diagnosis not present

## 2021-09-17 DIAGNOSIS — R8290 Unspecified abnormal findings in urine: Secondary | ICD-10-CM

## 2021-09-17 DIAGNOSIS — R309 Painful micturition, unspecified: Secondary | ICD-10-CM | POA: Diagnosis not present

## 2021-09-17 DIAGNOSIS — R21 Rash and other nonspecific skin eruption: Secondary | ICD-10-CM | POA: Diagnosis not present

## 2021-09-17 LAB — WET PREP FOR TRICH, YEAST, CLUE
MICRO NUMBER:: 12761980
Specimen Quality: ADEQUATE

## 2021-09-17 LAB — POCT URINALYSIS DIP (CLINITEK)
Bilirubin, UA: NEGATIVE
Blood, UA: NEGATIVE
Glucose, UA: NEGATIVE mg/dL
Ketones, POC UA: NEGATIVE mg/dL
Leukocytes, UA: NEGATIVE
Nitrite, UA: NEGATIVE
POC PROTEIN,UA: NEGATIVE
Spec Grav, UA: <=1.005 — AB (ref 1.010–1.025)
Urobilinogen, UA: 0.2 E.U./dL
pH, UA: 6 (ref 5.0–8.0)

## 2021-09-17 MED ORDER — NYSTATIN 100000 UNIT/GM EX CREA: 1.0000 "application " | TOPICAL_CREAM | Freq: Two times a day (BID) | CUTANEOUS | 1 refills | Status: DC

## 2021-09-17 NOTE — Progress Notes (Signed)
Pt presents today for UTI testing due to painful urination. Found rash on LLQ. States she used a new bath soap from [employer].   She's concerned it may be shingles.  POCT Urinalysis Wet Prep Culture  STAT Confirmation: [id number]

## 2021-09-17 NOTE — Progress Notes (Signed)
She had also noted a rash underneath the pannus.  She was concerned that it could be shingles.  On examination it was just erythematous and well demarcated along the edge.  Most consistent with skin yeast infection.  We will treat with nystatin cream.  No maceration or induration of the skin.  She also noticed that she is also been having some vaginal irritation as well so recommended that she do a wet prep to rule out yeast vaginitis we will call with results.  Urinalysis overall looked okay so we will send for culture for confirmation.

## 2021-09-18 NOTE — Progress Notes (Signed)
Hi Margie, on the vaginal swab they did not see any yeast or overgrowth of bacteria which is great.  I did send over the nystatin cream to use on the lower abdomen and suprapubic area.  Also please schedule a follow-up in about 2 weeks so that we can recheck your blood pressure we can do a nurse visit for that and you had mentioned that you did not get a chance to take your blood pressure medication the night before.  But I do want to make sure that it is back under control.

## 2021-09-19 LAB — URINE CULTURE
MICRO NUMBER:: 12764700
SPECIMEN QUALITY:: ADEQUATE

## 2021-09-20 ENCOUNTER — Encounter: Payer: Self-pay | Admitting: Family Medicine

## 2021-09-20 NOTE — Progress Notes (Signed)
No evidence of active bladder infection.

## 2021-09-21 DIAGNOSIS — H524 Presbyopia: Secondary | ICD-10-CM | POA: Diagnosis not present

## 2021-09-30 ENCOUNTER — Ambulatory Visit (INDEPENDENT_AMBULATORY_CARE_PROVIDER_SITE_OTHER): Payer: Medicare HMO | Admitting: Family Medicine

## 2021-09-30 ENCOUNTER — Other Ambulatory Visit: Payer: Self-pay

## 2021-09-30 VITALS — BP 138/62 | HR 65

## 2021-09-30 DIAGNOSIS — I10 Essential (primary) hypertension: Secondary | ICD-10-CM

## 2021-09-30 NOTE — Progress Notes (Signed)
Medical screening examination/treatment was performed by qualified clinical staff member and as supervising physician I was immediately available for consultation/collaboration. I have reviewed documentation and agree with assessment and plan.  Shantanu Strauch, DO  

## 2021-09-30 NOTE — Progress Notes (Signed)
Pt here for nurse BP check.  Pt denies CP, SOB, headaches, dizziness or missed doses of medications.  T. Leidy Massar, CMA ? ?

## 2021-10-01 ENCOUNTER — Other Ambulatory Visit: Payer: Self-pay | Admitting: Family Medicine

## 2021-10-22 ENCOUNTER — Encounter: Payer: Self-pay | Admitting: Family Medicine

## 2021-10-22 MED ORDER — LISINOPRIL 20 MG PO TABS: 20.0000 mg | ORAL_TABLET | Freq: Every day | ORAL | 3 refills | Status: DC

## 2021-10-22 NOTE — Telephone Encounter (Signed)
Changing to lisinopril.

## 2021-11-20 ENCOUNTER — Other Ambulatory Visit: Payer: Self-pay | Admitting: Family Medicine

## 2021-12-11 ENCOUNTER — Other Ambulatory Visit: Payer: Self-pay | Admitting: Osteopathic Medicine

## 2021-12-14 ENCOUNTER — Ambulatory Visit: Payer: Medicare HMO | Admitting: Physician Assistant

## 2021-12-17 ENCOUNTER — Other Ambulatory Visit: Payer: Self-pay | Admitting: Family Medicine

## 2021-12-17 DIAGNOSIS — N183 Chronic kidney disease, stage 3 unspecified: Secondary | ICD-10-CM

## 2021-12-17 DIAGNOSIS — I129 Hypertensive chronic kidney disease with stage 1 through stage 4 chronic kidney disease, or unspecified chronic kidney disease: Secondary | ICD-10-CM

## 2021-12-18 ENCOUNTER — Ambulatory Visit: Payer: Medicare HMO | Admitting: Physician Assistant

## 2021-12-24 DIAGNOSIS — N1831 Chronic kidney disease, stage 3a: Secondary | ICD-10-CM | POA: Diagnosis not present

## 2021-12-24 DIAGNOSIS — M8589 Other specified disorders of bone density and structure, multiple sites: Secondary | ICD-10-CM | POA: Diagnosis not present

## 2021-12-24 DIAGNOSIS — M353 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: Secondary | ICD-10-CM | POA: Diagnosis not present

## 2021-12-24 DIAGNOSIS — Z6830 Body mass index (BMI) 30.0-30.9, adult: Secondary | ICD-10-CM | POA: Diagnosis not present

## 2021-12-24 DIAGNOSIS — E669 Obesity, unspecified: Secondary | ICD-10-CM | POA: Diagnosis not present

## 2021-12-24 DIAGNOSIS — M1991 Primary osteoarthritis, unspecified site: Secondary | ICD-10-CM | POA: Diagnosis not present

## 2021-12-24 DIAGNOSIS — M069 Rheumatoid arthritis, unspecified: Secondary | ICD-10-CM | POA: Diagnosis not present

## 2021-12-24 DIAGNOSIS — B0223 Postherpetic polyneuropathy: Secondary | ICD-10-CM | POA: Diagnosis not present

## 2021-12-24 DIAGNOSIS — B0230 Zoster ocular disease, unspecified: Secondary | ICD-10-CM | POA: Diagnosis not present

## 2021-12-31 ENCOUNTER — Encounter: Payer: Self-pay | Admitting: Family Medicine

## 2021-12-31 ENCOUNTER — Ambulatory Visit (INDEPENDENT_AMBULATORY_CARE_PROVIDER_SITE_OTHER): Payer: Medicare HMO | Admitting: Family Medicine

## 2021-12-31 VITALS — BP 137/77 | HR 70 | Temp 97.6°F | Ht 63.0 in | Wt 189.1 lb

## 2021-12-31 DIAGNOSIS — I129 Hypertensive chronic kidney disease with stage 1 through stage 4 chronic kidney disease, or unspecified chronic kidney disease: Secondary | ICD-10-CM

## 2021-12-31 DIAGNOSIS — M069 Rheumatoid arthritis, unspecified: Secondary | ICD-10-CM

## 2021-12-31 DIAGNOSIS — N183 Chronic kidney disease, stage 3 unspecified: Secondary | ICD-10-CM

## 2021-12-31 DIAGNOSIS — E039 Hypothyroidism, unspecified: Secondary | ICD-10-CM

## 2021-12-31 DIAGNOSIS — E785 Hyperlipidemia, unspecified: Secondary | ICD-10-CM | POA: Diagnosis not present

## 2021-12-31 NOTE — Assessment & Plan Note (Signed)
Tolerating atorvastatin well at current strength.  We will plan to continue. ?

## 2021-12-31 NOTE — Progress Notes (Signed)
?Melody Moore - 71 y.o. female MRN [id number]  Date of birth: 06/20/1951 ? ?Subjective ?No chief complaint on file. ? ? ?HPI ?Melody Moore is a 71 y.o. female here today for follow up visit.  Doing well at this time.  ? ?BP remains well controlled with combination of amlodipine and lisinopril.   Was on quinapril previously however this was recalled.  No issues with side effects relate to medication.  Denies chest pain, shortness of breath, palpitations, headache or vision changes.  ? ?Feels good on current dose of levothyroxine.  Due to have updated labs.  ? ?Tolerating atorvastatin well for HLD and lipids have been well controlled with current strength.  ? ?Continues to see rheumatology due to hx of RA.  This is well controlled with current DMARD regimen. ? ?ROS:  A comprehensive ROS was completed and negative except as noted per HPI ? ?Allergies  ?Allergen Reactions  ? Bee Venom Anaphylaxis  ? Methotrexate Derivatives Other (See Comments)  ?  Acute kidney injury  ? Oxycodone Other (See Comments)  ?  Adrenal crisis while taking both prednisone and oxycodone   ? Prednisone   ?  Adrenal crisis while taking both prednisone and oxycodone   ? Verapamil Hives  ? Latex Rash  ? Nickel Rash  ? Nitrofuran Derivatives Rash  ? Sulfasalazine Diarrhea and Nausea And Vomiting  ? ? ?Past Medical History:  ?Diagnosis Date  ? Adrenal crisis ([place]) 02/12/2016  ? AKI (acute kidney injury) ([place]) 02/2016  ? Allergy   ? Arthritis   ? Carotid stenosis, asymptomatic, bilateral 05/26/2017  ? Dopplers 02/12/2016: right - mid-distal 40-59% stenosis; left - 40-59% stenosis  ? Cataract   ? CKD (chronic kidney disease) stage 3, GFR 30-59 ml/min (HCC) 11/02/2017  ? Decreased calculated GFR 06/15/2017  ? Gallstones 06/22/2017  ? History of echocardiogram 11/07/2017  ? 04/09/2016: EF 56%, mild diastolic dysfunction, trivial MR and TR  ? Hypertension   ? Hypothyroidism   ? Immunosuppression due to drug therapy ([place]) 11/02/2017  ? Rheumatoid arthritis  ([place])   ? Rheumatoid arthritis involving multiple sites ([place]) 05/26/2017  ? Transaminitis 06/15/2017  ? Normal abdominal US 06/22/17  ? ? ?Past Surgical History:  ?Procedure Laterality Date  ? APPENDECTOMY    ? CATARACT EXTRACTION, BILATERAL  2015  ? 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  ? TOTAL KNEE ARTHROPLASTY Right 12/16/2017  ? Procedure: RIGHT TOTAL KNEE ARTHROPLASTY;  Surgeon: Dorna Leitz, MD;  Location: [facility];  Service: Orthopedics;  Laterality: Right;  Adductor Block  ? ? ?Social History  ? ?Socioeconomic History  ? Marital status: Married  ?  Spouse name: Fulton Mole  ? Number of children: 0  ? Years of education: 34  ? Highest education level: Bachelor's degree (e.g., BA, AB, BS)  ?Occupational History  ? Occupation: [employer]  ?  Comment: retired  ?Tobacco Use  ? Smoking status: Former  ?  Types: Cigarettes  ?  Quit date: 02/07/1974  ?  Years since quitting: 47.9  ? Smokeless tobacco: Never  ?Vaping Use  ? Vaping Use: Never used  ?Substance and Sexual Activity  ? Alcohol use: Yes  ?  Comment: occasionally  ? Drug use: No  ? Sexual activity: Yes  ?  Partners: Male  ?  Birth control/protection: Post-menopausal  ?Other Topics Concern  ? Not on file  ?Social History Narrative  ? 06/27/2021 - recently married in 2020 - originally from [place]   ?   ?   ?  Walks the dog everyday. Runs errands. Caffeine use 2 cups of coffee  ? ?Social Determinants of Health  ? ?Financial Resource Strain: Low Risk   ? Difficulty of Paying Living Expenses: Not hard at all  ?Food Insecurity: No Food Insecurity  ? Worried About Charity fundraiser in the Last Year: Never true  ? Ran Out of Food in the Last Year: Never true  ?Transportation Needs: No Transportation Needs  ? Lack of Transportation (Medical): No  ? Lack of Transportation (Non-Medical): No  ?Physical Activity: Sufficiently Active  ? Days of Exercise per Week: 7 days  ? Minutes of Exercise per Session: 40 min  ?Stress: No Stress Concern Present  ?  Feeling of Stress : Not at all  ?Social Connections: Socially Integrated  ? Frequency of Communication with Friends and Family: More than three times a week  ? Frequency of Social Gatherings with Friends and Family: More than three times a week  ? Attends Religious Services: More than 4 times per year  ? Active Member of Clubs or Organizations: Yes  ? Attends Archivist Meetings: More than 4 times per year  ? Marital Status: Married  ? ? ?Family History  ?Problem Relation Age of Onset  ? Diabetes Mother   ? Lung cancer Father   ? Hypertension Father   ? Uterine cancer Sister   ? ? ?Health Maintenance  ?Topic Date Due  ? Pneumonia Vaccine 15+ Years old (1 - PCV) Never done  ? COVID-19 Vaccine (4 - Booster for Pfizer series) 05/28/2021  ? Zoster Vaccines- Shingrix (2 of 2) 05/28/2021  ? MAMMOGRAM  04/23/2023  ? TETANUS/TDAP  03/25/2027  ? COLONOSCOPY (Pts 45-42yr Insurance coverage will need to be confirmed)  04/04/2027  ? INFLUENZA VACCINE  Completed  ? DEXA SCAN  Completed  ? Hepatitis C Screening  Completed  ? HPV VACCINES  Aged Out  ? ? ? ?----------------------------------------------------------------------------------------------------------------------------------------------------------------------------------------------------------------- ?Physical Exam ?BP 137/77   Pulse 70   Temp 97.6 ?F (36.4 ?C)   Ht '5\' 3"'$  (1.6 m)   Wt 189 lb 1.9 oz (85.8 kg)   SpO2 96%   BMI 33.50 kg/m?  ? ?Physical Exam ?Constitutional:   ?   Appearance: Normal appearance.  ?Eyes:  ?   General: No scleral icterus. ?Cardiovascular:  ?   Rate and Rhythm: Normal rate and regular rhythm.  ?Pulmonary:  ?   Effort: Pulmonary effort is normal.  ?   Breath sounds: Normal breath sounds.  ?Musculoskeletal:  ?   Cervical back: Neck supple.  ?Neurological:  ?   General: No focal deficit present.  ?   Mental Status: She is alert.  ?Psychiatric:     ?   Mood and Affect: Mood normal.     ?   Behavior: Behavior normal.   ? ? ?------------------------------------------------------------------------------------------------------------------------------------------------------------------------------------------------------------------- ?Assessment and Plan ? ?Benign hypertension with CKD (chronic kidney disease) stage III (HCC) ?Blood pressure is well controlled at this time.  Recommend continuation of current medication for management of hypertension. ? ?Acquired hypothyroidism ?Updating TSH today. ? ?Rheumatoid arthritis involving multiple sites ([facility] ?Management per rheumatology.  Stable at this time with current medications. ? ?Dyslipidemia ?Tolerating atorvastatin well at current strength.  We will plan to continue. ? ? ?No orders of the defined types were placed in this encounter. ? ? ?No follow-ups on file. ? ? ? ?This visit occurred during the SARS-CoV-2 public health emergency.  Safety protocols were in place, including screening questions prior to the visit,  additional usage of staff PPE, and extensive cleaning of exam room while observing appropriate contact time as indicated for disinfecting solutions.  ? ?

## 2021-12-31 NOTE — Assessment & Plan Note (Signed)
Management per rheumatology.  Stable at this time with current medications. ?

## 2021-12-31 NOTE — Assessment & Plan Note (Signed)
Blood pressure is well-controlled at this time.  Recommend continuation of current medication for management of hypertension. 

## 2021-12-31 NOTE — Assessment & Plan Note (Signed)
Updating TSH today. 

## 2022-01-01 ENCOUNTER — Encounter: Payer: Self-pay | Admitting: Family Medicine

## 2022-01-01 LAB — TSH: TSH: 1.03 mIU/L (ref 0.40–4.50)

## 2022-02-18 ENCOUNTER — Other Ambulatory Visit: Payer: Self-pay | Admitting: Family Medicine

## 2022-03-05 ENCOUNTER — Ambulatory Visit (INDEPENDENT_AMBULATORY_CARE_PROVIDER_SITE_OTHER): Payer: Medicare HMO

## 2022-03-05 ENCOUNTER — Ambulatory Visit (INDEPENDENT_AMBULATORY_CARE_PROVIDER_SITE_OTHER): Payer: Medicare HMO | Admitting: Sports Medicine

## 2022-03-05 DIAGNOSIS — M5416 Radiculopathy, lumbar region: Secondary | ICD-10-CM

## 2022-03-05 DIAGNOSIS — M4316 Spondylolisthesis, lumbar region: Secondary | ICD-10-CM | POA: Diagnosis not present

## 2022-03-05 MED ORDER — CYCLOBENZAPRINE HCL 10 MG PO TABS: ORAL_TABLET | ORAL | 0 refills | Status: DC

## 2022-03-05 MED ORDER — PREDNISONE 10 MG (48) PO TBPK: ORAL_TABLET | Freq: Every day | ORAL | 0 refills | Status: DC

## 2022-03-05 NOTE — Progress Notes (Signed)
    Procedures performed today:    None.  Independent interpretation of notes and tests performed by another provider:   None.  Brief History, Exam, Impression, and Recommendations:    Right lumbar radiculitis This is a very pleasant 71 year old female history of rheumatoid arthritis currently on leflunomide, she is long history of axial low back pain with radiation down both legs, left leg to the knee, right leg down past the knee to the middle 3 toes. Axial component is worse with sitting, flexion, Valsalva. She has had an intramuscular steroid injection in the past that seem to work well with her rheumatologist. She also has a history of an adrenal crisis, but this was in the setting of 10 years of chronic prednisone, and orthopedic surgical procedure that led to the adrenal crisis. I think this is a right L5 radiculitis with an axial discogenic component. We discussed the anatomy and pathophysiology, we will start with x-rays, prednisone 12-day taper, Flexeril at night, formal physical therapy. Return to see me in about 6 weeks and we can proceed with MRI for interventional planning if not better.  Current process with exacerbation and pharmacologic intervention  ___________________________________________ Gwen Her. Dianah Field, M.D., ABFM., CAQSM. Primary Care and [place] Instructor of [place] [facility]

## 2022-03-05 NOTE — Assessment & Plan Note (Signed)
This is a very pleasant 71 year old female history of rheumatoid arthritis currently on leflunomide, she is long history of axial low back pain with radiation down both legs, left leg to the knee, right leg down past the knee to the middle 3 toes. Axial component is worse with sitting, flexion, Valsalva. She has had an intramuscular steroid injection in the past that seem to work well with her rheumatologist. She also has a history of an adrenal crisis, but this was in the setting of 10 years of chronic prednisone, and orthopedic surgical procedure that led to the adrenal crisis. I think this is a right L5 radiculitis with an axial discogenic component. We discussed the anatomy and pathophysiology, we will start with x-rays, prednisone 12-day taper, Flexeril at night, formal physical therapy. Return to see me in about 6 weeks and we can proceed with MRI for interventional planning if not better.

## 2022-03-18 ENCOUNTER — Other Ambulatory Visit: Payer: Self-pay

## 2022-03-18 ENCOUNTER — Ambulatory Visit: Payer: Medicare HMO | Attending: Sports Medicine | Admitting: Physical Therapy

## 2022-03-18 ENCOUNTER — Encounter: Payer: Self-pay | Admitting: Physical Therapy

## 2022-03-18 DIAGNOSIS — M6281 Muscle weakness (generalized): Secondary | ICD-10-CM | POA: Diagnosis present

## 2022-03-18 DIAGNOSIS — M5416 Radiculopathy, lumbar region: Secondary | ICD-10-CM | POA: Diagnosis present

## 2022-03-18 NOTE — Patient Instructions (Signed)
Access Code: [email or web address] URL: [email or web address] Date: 03/18/2022 Prepared by: Almyra Free  Exercises - Supine Bridge  - 2 x daily - 7 x weekly - 1-3 sets - 10 reps - Supine Posterior Pelvic Tilt  - 2 x daily - 7 x weekly - 1 sets - 10 reps - 5 sec hold - Supine March  - 2 x daily - 7 x weekly - 2 sets - 10 reps - Supine Quadriceps Stretch with Strap on Table  - 2 x daily - 7 x weekly - 1 sets - 3 reps - 30-60 sec hold

## 2022-03-18 NOTE — Therapy (Signed)
[place] [address] [place], [place], [address] Phone: [phone]   Fax:  [phone]  Physical Therapy Evaluation  Patient Details  Name: Melody Moore MRN: [id number] Date of Birth: Apr 21, 1951 Referring Provider (PT): Thekkekandam   Encounter Date: 03/18/2022  Rationale for Evaluation and Treatment Rehabilitation    PT End of Session - 03/18/22 0934     Visit Number 1    Date for PT Re-Evaluation 04/29/22    Authorization Type humana MCR    Progress Note Due on Visit 10    PT Start Time 7253    PT Stop Time 1015    PT Time Calculation (min) 41 min    Activity Tolerance Patient tolerated treatment well    Behavior During Therapy [facility] for tasks assessed/performed             Past Medical History:  Diagnosis Date   Adrenal crisis ([place]) 02/12/2016   AKI (acute kidney injury) ([place]) 02/2016   Allergy    Arthritis    Carotid stenosis, asymptomatic, bilateral 05/26/2017   Dopplers 02/12/2016: right - mid-distal 40-59% stenosis; left - 40-59% stenosis   Cataract    CKD (chronic kidney disease) stage 3, GFR 30-59 ml/min ([place]) 11/02/2017   Decreased calculated GFR 06/15/2017   Gallstones 06/22/2017   History of echocardiogram 11/07/2017   04/09/2016: EF 66%, mild diastolic dysfunction, trivial MR and TR   Hypertension    Hypothyroidism    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    Subjective Assessment - 03/18/22 0936     Subjective I have sciatica in both my legs. Still on prednisone. doing a little better. Has had pain for a while but never as bad as this episode. Generally she has back pain and N/T down R leg to lateral toes. Also gets in left leg.    Pertinent History HTN, OP, OA, B TKR    Diagnostic tests XRay: Grade 1 anterolisthesis of L4 on L5. Moderate to severe L5-S1 and  moderate L4-5 degenerative disc changes.    Patient Stated Goals maintain relief she has achieved with meds    Currently in Pain? No/denies                [facility] PT Assessment - 03/18/22 0943       Assessment   Medical Diagnosis M54.16 (ICD-10-CM) - Right lumbar radiculitis    Referring Provider (PT) Thekkekandam    Onset Date/Surgical Date 02/25/22    Hand Dominance Right    Prior Therapy yes - knee surgery      Precautions   Precautions None      Restrictions   Weight Bearing Restrictions No      Home Environment   Living Environment Private residence    Home Access Level entry      Prior Function   Level of Independence Independent    Vocation Retired    Leisure  walks dog 30 min a day      Observation/Other Assessments   Focus on Therapeutic Outcomes (FOTO)  67 (predicted 73)      Posture/Postural Control   Posture Comments flexed posture in sitting; mild forward head; genu valgus bil      ROM / Strength   AROM / PROM / Strength AROM;Strength      AROM   Overall AROM Comments lumbar ROM WFL;  tightness with RSB and bil rotation      Strength   Overall Strength Comments bil ankle DF 5/5    Strength Assessment Site Hip;Knee    Right/Left Hip Right;Left    Right Hip Flexion 4-/5    Right Hip Extension 4-/5    Right Hip ABduction 4/5    Right Hip ADduction 4/5    Left Hip Flexion 4-/5    Left Hip Extension 4-/5    Left Hip ABduction 5/5    Left Hip ADduction 4/5    Right/Left Knee Right;Left    Right Knee Flexion 4-/5    Right  Knee Extension 5/5    Left Knee Flexion 4+/5    Left Knee Extension 5/5      Flexibility   Soft Tissue Assessment /Muscle Length yes    Hamstrings marked bil    Quadriceps marked bil    Piriformis WNL    Quadratus Lumborum tight left      Palpation   Spinal mobility UPA lumbar mobs painful bil; decreased L>R    Palpation comment marked tenderness of bil lumbar and gluteals      Special Tests   Other special tests + SLR R; Neg slump bil                        Objective measurements completed on examination: See above findings.                PT Education - 03/18/22 1012     Education Details HEP, POC discussed    Person(s) Educated Patient    Methods Explanation;Demonstration;Handout;Tactile cues;Verbal cues    Comprehension Verbalized understanding;Returned demonstration              PT Short Term Goals - 03/18/22 1223       PT SHORT TERM GOAL #1   Title ind with initial HEP    Time 2    Period Weeks    Status New    Target Date 04/01/22               PT Long Term Goals - 03/18/22 1223       PT LONG TERM GOAL #1   Title Decreased LBP and radicular sx by 75% or more to improve QOL.    Time 6    Period Weeks    Status New    Target Date 04/29/22      PT LONG TERM GOAL #2   Title improved B LE strength to 4+/5 or better to improve function.    Time 6    Period Weeks    Status New      PT LONG TERM GOAL #3   Title improved core strength with functional activities as evidenced by stabilization with MMT.    Time 6    Period Weeks    Status New      PT LONG TERM GOAL #4   Title Independent in HEP    Time 6    Period Weeks  Status New      PT LONG TERM GOAL #5   Title Improve FOTO to >= 73 showing improved function    Time 6    Period Weeks    Status New                    Plan - 03/18/22 1015     Clinical Impression Statement Patient presents with reports of bil sciatica for about 3 weeks of  unknown origin. She has had it in the past but this was the worst it has been limiting walking, stairs and general ADLs including vacuuming. Prednisone has helped considerably and she reports no pain today. Original sx were down bil legs to toes R > L. She now demonstrates positive sciatic nerve tension on the right. Mild tightness in her lumbar rotation and SB and marked tightness in her HS and quads. She is very weak in her core and LE weakness R > L. She will benefit from skilled PT to address these deficits and return her to her PLOF.    Personal Factors and Comorbidities Comorbidity 3+    Comorbidities OP, OA, B TKR    Stability/Clinical Decision Making Stable/Uncomplicated    Clinical Decision Making Low    Rehab Potential Good    PT Frequency 2x / week    PT Duration 6 weeks    PT Treatment/Interventions ADLs/Self Care Home Management;Cryotherapy;Electrical Stimulation;Iontophoresis '4mg'$ /ml Dexamethasone;Moist Heat;Therapeutic exercise;Therapeutic activities;Stair training;Neuromuscular re-education;Patient/family education;Dry needling;Manual techniques;Taping    PT Next Visit Plan work on core and LE strengthening, MT to lumbar/gluteals    PT Home Exercise Plan [email or web address]    Consulted and Agree with Plan of Care Patient             Patient will benefit from skilled therapeutic intervention in order to improve the following deficits and impairments:  Decreased range of motion, Increased muscle spasms, Pain, Decreased activity tolerance, Hypomobility, Impaired flexibility, Postural dysfunction, Decreased strength  Visit Diagnosis: Radiculopathy, lumbar region  Muscle weakness (generalized)     Problem List Patient Active Problem List   Diagnosis Date Noted   Right lumbar radiculitis 03/05/2022   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 ([place]) 07/03/2021   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   Primary generalized (osteo)arthritis 07/03/2021   COVID-19 02/23/2021   Postherpetic neuralgia  05/21/2019   Normocytic anemia 02/22/2018   Encounter for monitoring statin therapy 02/22/2018   Dyslipidemia 02/22/2018   Primary osteoarthritis of right knee 12/16/2017   Benign hypertension with CKD (chronic kidney disease) stage III ([place]) 12/04/2017   History of echocardiogram 11/07/2017   Chronic kidney disease, stage 3a ([place]) 11/02/2017   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   Gallstones 06/22/2017   Elevated alkaline phosphatase level 06/15/2017   Transaminitis 06/15/2017   Acquired hypothyroidism 05/26/2017   Essential hypertension 05/26/2017   H/O total knee replacement, left 05/26/2017   History of adrenal insufficiency 05/26/2017   Rheumatoid arthritis involving multiple sites ([place]) 05/26/2017   Allergic rhinitis 05/26/2017   Right carotid bruit 05/26/2017   Carotid stenosis, asymptomatic, bilateral 05/26/2017   Allergy to honey bee venom 05/26/2017    Madelyn Flavors, PT 03/18/2022, 12:26 PM  [place], [place], [address] Phone: [phone]   Fax:  [phone]  Name: Melody Moore MRN: [id number] Date of Birth: 19-Feb-1951

## 2022-03-24 ENCOUNTER — Ambulatory Visit: Payer: Medicare HMO | Admitting: Physical Therapy

## 2022-03-24 DIAGNOSIS — M6281 Muscle weakness (generalized): Secondary | ICD-10-CM | POA: Diagnosis not present

## 2022-03-24 DIAGNOSIS — M5416 Radiculopathy, lumbar region: Secondary | ICD-10-CM

## 2022-03-24 NOTE — Therapy (Signed)
[place], [place], [address] Phone: [phone]   Fax:  [phone]  Physical Therapy Treatment  Patient Details  Name: Melody Moore MRN: [id number] Date of Birth: 1951-03-06 Referring Provider (PT): Dianah Field   Encounter Date: 03/24/2022   PT End of Session - 03/24/22 1015     Visit Number 2    Date for PT Re-Evaluation 04/29/22    Authorization Type humana MCR    Authorization - Visit Number 2    Authorization - Number of Visits 12    Progress Note Due on Visit 10    PT Start Time 0930    PT Stop Time 1011    PT Time Calculation (min) 41 min    Activity Tolerance Patient tolerated treatment well    Behavior During Therapy [facility] for tasks assessed/performed             Past Medical History:  Diagnosis Date   Adrenal crisis ([place]) 02/12/2016   AKI (acute kidney injury) ([place]) 02/2016   Allergy    Arthritis    Carotid stenosis, asymptomatic, bilateral 05/26/2017   Dopplers 02/12/2016: right - mid-distal 40-59% stenosis; left - 40-59% stenosis   Cataract    CKD (chronic kidney disease) stage 3, GFR 30-59 ml/min ([place]) 11/02/2017   Decreased calculated GFR 06/15/2017   Gallstones 06/22/2017   History of echocardiogram 11/07/2017   04/09/2016: EF 62%, mild diastolic dysfunction, trivial MR and TR   Hypertension    Hypothyroidism    Immunosuppression due to drug therapy ([place]) 11/02/2017   Rheumatoid arthritis ([place])    Rheumatoid arthritis involving multiple sites ([place]) 05/26/2017   Transaminitis 06/15/2017   Normal abdominal US 06/22/17    Past Surgical History:  Procedure Laterality Date   APPENDECTOMY     CATARACT EXTRACTION, BILATERAL  2015   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   TOTAL KNEE ARTHROPLASTY Right 12/16/2017   Procedure: RIGHT TOTAL KNEE ARTHROPLASTY;  Surgeon: Dorna Leitz, MD;  Location: [facility];  Service: Orthopedics;  Laterality: Right;  Adductor Block    There were no  vitals filed for this visit.   Subjective Assessment - 03/24/22 0937     Subjective Pt states she is feeling fine this morning. Pt reports sciatica is not bad. Pt reports she is completely off the prednisone. No N/T this morning. A couple of mornings she did feel it just in her glute but not radiating. After walking it settled down.    Pertinent History HTN, OP, OA, B TKR    Diagnostic tests XRay: Grade 1 anterolisthesis of L4 on L5. Moderate to severe L5-S1 and  moderate L4-5 degenerative disc changes.    Patient Stated Goals maintain relief she has achieved with meds                [facility] PT Assessment - 03/24/22 0001       Assessment   Medical Diagnosis M54.16 (ICD-10-CM) - Right lumbar radiculitis    Referring Provider (PT) Thekkekandam    Onset Date/Surgical Date 02/25/22    Hand Dominance Right                           [facility] Adult PT Treatment/Exercise - 03/24/22 0001       Lumbar Exercises: Stretches   Passive Hamstring Stretch Right;Left;2 reps;30 seconds    Hip Flexor Stretch Right;Left;2 reps;30 seconds    Piriformis Stretch Right;Left;30 seconds;2 reps    Figure  4 Stretch Supine;1 rep    Figure 4 Stretch Limitations could feel in back/onset of sciatica    Other Lumbar Stretch Exercise low trunk rotation 3x20 sec      Lumbar Exercises: Aerobic   Nustep L4 x 5 min      Lumbar Exercises: Supine   Ab Set 10 reps    Pelvic Tilt 10 reps    Pelvic Tilt Limitations 3 sec iso    Bent Knee Raise 10 reps    Bent Knee Raise Limitations with feet on pball; single leg marching and then DL pball roll 2x10    Bridge Compliant;20 reps      Manual Therapy   Manual Therapy Soft tissue mobilization;Myofascial release                       PT Short Term Goals - 03/18/22 1223       PT SHORT TERM GOAL #1   Title ind with initial HEP    Time 2    Period Weeks    Status New    Target Date 04/01/22               PT Long Term Goals -  03/18/22 1223       PT LONG TERM GOAL #1   Title Decreased LBP and radicular sx by 75% or more to improve QOL.    Time 6    Period Weeks    Status New    Target Date 04/29/22      PT LONG TERM GOAL #2   Title improved B LE strength to 4+/5 or better to improve function.    Time 6    Period Weeks    Status New      PT LONG TERM GOAL #3   Title improved core strength with functional activities as evidenced by stabilization with MMT.    Time 6    Period Weeks    Status New      PT LONG TERM GOAL #4   Title Independent in HEP    Time 6    Period Weeks    Status New      PT LONG TERM GOAL #5   Title Improve FOTO to >= 73 showing improved function    Time 6    Period Weeks    Status New                   Plan - 03/24/22 0948     Clinical Impression Statement Sciatica has been decreasing. Could feel sciatica oncoming with Figure 4 stretch. Continued to work on hip stretching and core/LE strengthening. Working on improving lumbopelvic control -- tends to compensate during PPT using feet/glutes.    Personal Factors and Comorbidities Comorbidity 3+    Comorbidities OP, OA, B TKR    Stability/Clinical Decision Making Stable/Uncomplicated    Rehab Potential Good    PT Frequency 2x / week    PT Duration 6 weeks    PT Treatment/Interventions ADLs/Self Care Home Management;Cryotherapy;Electrical Stimulation;Iontophoresis '4mg'$ /ml Dexamethasone;Moist Heat;Therapeutic exercise;Therapeutic activities;Stair training;Neuromuscular re-education;Patient/family education;Dry needling;Manual techniques;Taping    PT Next Visit Plan work on core and LE strengthening, MT to lumbar/gluteals    PT Home Exercise Plan [email or web address]    Consulted and Agree with Plan of Care Patient             Patient will benefit from skilled therapeutic intervention in order to improve the following deficits and impairments:  Decreased range  of motion, Increased muscle spasms, Pain, Decreased activity  tolerance, Hypomobility, Impaired flexibility, Postural dysfunction, Decreased strength  Visit Diagnosis: Radiculopathy, lumbar region  Muscle weakness (generalized)     Problem List Patient Active Problem List   Diagnosis Date Noted   Right lumbar radiculitis 03/05/2022   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 ([place]) 07/03/2021   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   Primary generalized (osteo)arthritis 07/03/2021   COVID-19 02/23/2021   Postherpetic neuralgia 05/21/2019   Normocytic anemia 02/22/2018   Encounter for monitoring statin therapy 02/22/2018   Dyslipidemia 02/22/2018   Primary osteoarthritis of right knee 12/16/2017   Benign hypertension with CKD (chronic kidney disease) stage III ([place]) 12/04/2017   History of echocardiogram 11/07/2017   Chronic kidney disease, stage 3a ([place]) 11/02/2017   Immunosuppression due to drug therapy ([place]) 11/02/2017   Gallstones 06/22/2017   Elevated alkaline phosphatase level 06/15/2017   Transaminitis 06/15/2017   Acquired hypothyroidism 05/26/2017   Essential hypertension 05/26/2017   H/O total knee replacement, left 05/26/2017   History of adrenal insufficiency 05/26/2017   Rheumatoid arthritis involving multiple sites ([place]) 05/26/2017   Allergic rhinitis 05/26/2017   Right carotid bruit 05/26/2017   Carotid stenosis, asymptomatic, bilateral 05/26/2017   Allergy to honey bee venom 05/26/2017    [facility] April Gordy Levan, PT, DPT 03/24/2022, 10:16 AM  [facility] [place] [address] [place], [place], [address] Phone: [phone]   Fax:  [phone]  Name: Melody Moore MRN: [id number] Date of Birth: 1951-02-19

## 2022-03-26 DIAGNOSIS — M069 Rheumatoid arthritis, unspecified: Secondary | ICD-10-CM | POA: Diagnosis not present

## 2022-03-30 ENCOUNTER — Ambulatory Visit: Payer: Medicare HMO | Admitting: Physical Therapy

## 2022-03-30 DIAGNOSIS — M6281 Muscle weakness (generalized): Secondary | ICD-10-CM

## 2022-03-30 DIAGNOSIS — M5416 Radiculopathy, lumbar region: Secondary | ICD-10-CM | POA: Diagnosis not present

## 2022-04-01 ENCOUNTER — Encounter: Payer: Self-pay | Admitting: Physical Therapy

## 2022-04-01 ENCOUNTER — Telehealth: Payer: Medicare HMO | Admitting: Physician Assistant

## 2022-04-01 ENCOUNTER — Ambulatory Visit: Payer: Medicare HMO | Admitting: Physical Therapy

## 2022-04-01 DIAGNOSIS — M5416 Radiculopathy, lumbar region: Secondary | ICD-10-CM

## 2022-04-01 DIAGNOSIS — B029 Zoster without complications: Secondary | ICD-10-CM | POA: Diagnosis not present

## 2022-04-01 DIAGNOSIS — M6281 Muscle weakness (generalized): Secondary | ICD-10-CM | POA: Diagnosis not present

## 2022-04-01 MED ORDER — MUPIROCIN 2 % EX OINT: 1.0000 | TOPICAL_OINTMENT | Freq: Two times a day (BID) | CUTANEOUS | 0 refills | Status: AC

## 2022-04-01 MED ORDER — ACYCLOVIR 800 MG PO TABS: 800.0000 mg | ORAL_TABLET | Freq: Every day | ORAL | 0 refills | Status: AC

## 2022-04-01 NOTE — Therapy (Signed)
[place], [place], [address] Phone: [phone]   Fax:  [phone]  Physical Therapy Treatment  Patient Details  Name: Melody Moore MRN: [id number] Date of Birth: 1950/10/10 Referring Provider (PT): Thekkekandam   Encounter Date: 04/01/2022  Rationale for Evaluation and Treatment Rehabilitation   PT End of Session - 04/01/22 2831     Visit Number 4    Date for PT Re-Evaluation 04/29/22    Authorization Type humana MCR    Authorization - Visit Number 4    Authorization - Number of Visits 12    Progress Note Due on Visit 10    PT Start Time 5176    PT Stop Time 1400    PT Time Calculation (min) 43 min    Activity Tolerance Patient tolerated treatment well    Behavior During Therapy [facility] for tasks assessed/performed             Past Medical History:  Diagnosis Date   Adrenal crisis ([place]) 02/12/2016   AKI (acute kidney injury) ([place]) 02/2016   Allergy    Arthritis    Carotid stenosis, asymptomatic, bilateral 05/26/2017   Dopplers 02/12/2016: right - mid-distal 40-59% stenosis; left - 40-59% stenosis   Cataract    CKD (chronic kidney disease) stage 3, GFR 30-59 ml/min ([place]) 11/02/2017   Decreased calculated GFR 06/15/2017   Gallstones 06/22/2017   History of echocardiogram 11/07/2017   04/09/2016: EF 16%, mild diastolic dysfunction, trivial MR and TR   Hypertension    Hypothyroidism    Immunosuppression due to drug therapy ([place]) 11/02/2017   Rheumatoid arthritis ([place])    Rheumatoid arthritis involving multiple sites ([place]) 05/26/2017   Transaminitis 06/15/2017   Normal abdominal US 06/22/17    Past Surgical History:  Procedure Laterality Date   APPENDECTOMY     CATARACT EXTRACTION, BILATERAL  2015   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   TOTAL KNEE ARTHROPLASTY Right 12/16/2017   Procedure: RIGHT TOTAL KNEE ARTHROPLASTY;  Surgeon: Dorna Leitz, MD;  [facility]: [facility];  Service:  Orthopedics;  Laterality: Right;  Adductor Block    There were no vitals filed for this visit.   Subjective Assessment - 04/01/22 1319     Subjective No pain in the back but I did get Shingles today. I feel it in my rear.    Pertinent History HTN, OP, OA, B TKR    Diagnostic tests XRay: Grade 1 anterolisthesis of L4 on L5. Moderate to severe L5-S1 and  moderate L4-5 degenerative disc changes.    Patient Stated Goals maintain relief she has achieved with meds    Currently in Pain? No/denies                               [facility] Adult PT Treatment/Exercise - 04/01/22 0001       Lumbar Exercises: Stretches   Passive Hamstring Stretch Right;Left;2 reps;30 seconds    Hip Flexor Stretch Right;Left;2 reps;30 seconds    Piriformis Stretch Right;Left;30 seconds;2 reps    Other Lumbar Stretch Exercise lower trunk rotation 5 x 10 sec      Lumbar Exercises: Aerobic   Recumbent Bike L2 x 5 min      Lumbar Exercises: Standing   Shoulder Extension Limitations GTB x 30 reps    Other Standing Lumbar Exercises pallof press red TB x 10 bilat      Lumbar Exercises: Seated   Sit to  Stand 20 reps    Sit to Stand Limitations 5#KB to [facility] lift      Lumbar Exercises: Supine   Ab Set 10 reps    Pelvic Tilt 10 reps    Pelvic Tilt Limitations with small marches x 10 ea    Bent Knee Raise 10 reps    Bent Knee Raise Limitations with feet on pball; single leg marching    Bridge Compliant;20 reps;5 seconds    Bridge Limitations on Pball      Lumbar Exercises: Sidelying   Clam Right;Left;20 reps    Clam Limitations RTB    Hip Abduction Right;Left;20 reps    Hip Abduction Limitations RTB                       PT Short Term Goals - 04/01/22 1322       PT SHORT TERM GOAL #1   Title ind with initial HEP    Status Achieved               PT Long Term Goals - 04/01/22 1322       PT LONG TERM GOAL #1   Title Decreased LBP and radicular sx by 75% or more to  improve QOL.    Status Achieved      PT LONG TERM GOAL #2   Title improved B LE strength to 4+/5 or better to improve function.    Status On-going      PT LONG TERM GOAL #3   Title improved core strength with functional activities as evidenced by stabilization with MMT.    Status On-going      PT LONG TERM GOAL #4   Title Independent in HEP    Status On-going                   Plan - 04/01/22 1402     Clinical Impression Statement [name] is progressing well toward her LTGs. She still requires significant cueing with therex especially with TrA activities, but also with new TE added to HEP.    PT Frequency 2x / week    PT Duration 6 weeks    PT Treatment/Interventions ADLs/Self Care Home Management;Cryotherapy;Electrical Stimulation;Iontophoresis '4mg'$ /ml Dexamethasone;Moist Heat;Therapeutic exercise;Therapeutic activities;Stair training;Neuromuscular re-education;Patient/family education;Dry needling;Manual techniques;Taping    PT Next Visit Plan work on core and LE strengthening, MT to lumbar/gluteals    Consulted and Agree with Plan of Care Patient             Patient will benefit from skilled therapeutic intervention in order to improve the following deficits and impairments:  Decreased range of motion, Increased muscle spasms, Pain, Decreased activity tolerance, Hypomobility, Impaired flexibility, Postural dysfunction, Decreased strength  Visit Diagnosis: Radiculopathy, lumbar region  Muscle weakness (generalized)     Problem List Patient Active Problem List   Diagnosis Date Noted   Right lumbar radiculitis 03/05/2022   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 ([place]) 07/03/2021   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   Primary generalized (osteo)arthritis 07/03/2021   COVID-19 02/23/2021   Postherpetic neuralgia 05/21/2019   Normocytic anemia 02/22/2018   Encounter for monitoring statin therapy 02/22/2018   Dyslipidemia 02/22/2018   Primary osteoarthritis of right knee  12/16/2017   Benign hypertension with CKD (chronic kidney disease) stage III ([place]) 12/04/2017   History of echocardiogram 11/07/2017   Chronic kidney disease, stage 3a ([place]) 11/02/2017   Immunosuppression due to drug therapy ([place]) 11/02/2017   Gallstones 06/22/2017   Elevated alkaline phosphatase level 06/15/2017   Transaminitis 06/15/2017  Acquired hypothyroidism 05/26/2017   Essential hypertension 05/26/2017   H/O total knee replacement, left 05/26/2017   History of adrenal insufficiency 05/26/2017   Rheumatoid arthritis involving multiple sites ([place]) 05/26/2017   Allergic rhinitis 05/26/2017   Right carotid bruit 05/26/2017   Carotid stenosis, asymptomatic, bilateral 05/26/2017   Allergy to honey bee venom 05/26/2017    Madelyn Flavors, PT 04/01/2022, 3:24 PM  [facility] [place], [place], [address] Phone: [phone]   Fax:  [phone]  Name: Jovita Persing MRN: [id number] Date of Birth: [birth year]

## 2022-04-01 NOTE — Progress Notes (Signed)
Virtual Visit Consent   Melody Moore, you are scheduled for a virtual visit with a [facility] provider today. Just as with appointments in the office, your consent must be obtained to participate. Your consent will be active for this visit and any virtual visit you may have with one of our providers in the next 365 days. If you have a MyChart account, a copy of this consent can be sent to you electronically.  As this is a virtual visit, video technology does not allow for your provider to perform a traditional examination. This may limit your provider's ability to fully assess your condition. If your provider identifies any concerns that need to be evaluated in person or the need to arrange testing (such as labs, EKG, etc.), we will make arrangements to do so. Although advances in technology are sophisticated, we cannot ensure that it will always work on either your end or our end. If the connection with a video visit is poor, the visit may have to be switched to a telephone visit. With either a video or telephone visit, we are not always able to ensure that we have a secure connection.  By engaging in this virtual visit, you consent to the provision of healthcare and authorize for your insurance to be billed (if applicable) for the services provided during this visit. Depending on your insurance coverage, you may receive a charge related to this service.  I need to obtain your verbal consent now. Are you willing to proceed with your visit today? Melody Moore has provided verbal consent on 04/01/2022 for a virtual visit (video or telephone). Leeanne Rio, [place]  Date: 04/01/2022 8:57 AM  Virtual Visit via Video Note   I, Leeanne Rio, connected with  Melody Moore  ([id number], 07/28/1951) on 04/01/22 at  8:45 AM EDT by a video-enabled telemedicine application and verified that I am speaking with the correct person using two identifiers.  Location: Patient: Virtual Visit  Location Patient: Home Provider: Virtual Visit Location Provider: Home Office   I discussed the limitations of evaluation and management by telemedicine and the availability of in person appointments. The patient expressed understanding and agreed to proceed.    History of Present Illness: Melody Moore is a 71 y.o. who identifies as a female who was assigned female at birth, and is being seen today for possible shingles rash. Notes symptoms starting about 3-4 days ago with itching of L gluteal region. Over 24 hours turned into stinging pain with formation of blistering rash. Denies fever, chills. No rash noted elsewhere. Has had shingles before with delayed treatment causing postherpetic neuralgia that she still has to continue gabapentin for.    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   Primary generalized (osteo)arthritis 07/03/2021   COVID-19 02/23/2021   Postherpetic neuralgia 05/21/2019   Normocytic anemia 02/22/2018   Encounter for monitoring statin therapy 02/22/2018   Dyslipidemia 02/22/2018   Primary osteoarthritis of right knee 12/16/2017   Benign hypertension with CKD (chronic kidney disease) stage III ([place]) 12/04/2017   History of echocardiogram 11/07/2017   Chronic kidney disease, stage 3a ([place]) 11/02/2017   Immunosuppression due to drug therapy ([place]) 11/02/2017   Gallstones 06/22/2017   Elevated alkaline phosphatase level 06/15/2017   Transaminitis 06/15/2017   Acquired hypothyroidism 05/26/2017   Essential hypertension 05/26/2017   H/O total knee replacement, left  05/26/2017   History of adrenal insufficiency 05/26/2017   Rheumatoid arthritis involving multiple sites ([place]) 05/26/2017   Allergic rhinitis 05/26/2017   Right carotid bruit 05/26/2017   Carotid stenosis, asymptomatic, bilateral 05/26/2017    Allergy to honey bee venom 05/26/2017    Allergies:  Allergies  Allergen Reactions   Bee Venom Anaphylaxis   Methotrexate Derivatives Other (See Comments)    Acute kidney injury   Oxycodone Other (See Comments)    Adrenal crisis while taking both prednisone and oxycodone    Prednisone     Adrenal crisis while taking both prednisone and oxycodone    Verapamil Hives   Latex Rash   Nickel Rash   Nitrofuran Derivatives Rash   Sulfasalazine Diarrhea and Nausea And Vomiting   Medications:  Current Outpatient Medications:    acyclovir (ZOVIRAX) 800 MG tablet, Take 1 tablet (800 mg total) by mouth 5 (five) times daily for 7 days., Disp: 35 tablet, Rfl: 0   mupirocin ointment (BACTROBAN) 2 %, Apply 1 Application topically 2 (two) times daily for 7 days., Disp: 22 g, Rfl: 0   acetaminophen (TYLENOL) 650 MG CR tablet, Take 2 tablets (1,300 mg total) by mouth 2 (two) times daily as needed for pain., Disp: , Rfl:    amLODipine (NORVASC) 10 MG tablet, TAKE 1 TABLET BY MOUTH EVERY DAY, Disp: 90 tablet, Rfl: 3   aspirin EC 81 MG tablet, Take 81 mg by mouth daily., Disp: , Rfl:    atorvastatin (LIPITOR) 40 MG tablet, TAKE 1 TABLET BY MOUTH EVERY DAY, Disp: 90 tablet, Rfl: 3   Calcium Carb-Cholecalciferol (CALCIUM 600 + D PO), Take 2 tablets by mouth daily., Disp: , Rfl:    Cholecalciferol (VITAMIN D) 2000 units tablet, Take 2,000 Units by mouth daily., Disp: , Rfl:    cyclobenzaprine (FLEXERIL) 10 MG tablet, One half to one tab PO qHS, then increase gradually to one tab TID., Disp: 30 tablet, Rfl: 0   docusate sodium (COLACE) 100 MG capsule, Take 100 mg by mouth daily., Disp: , Rfl:    EPINEPHRINE 0.3 mg/0.3 mL IJ SOAJ injection, INJECT 0.3 MLS (0.3 MG TOTAL) INTO THE MUSCLE ONCE AS NEEDED FOR UP TO 1 DOSE., Disp: 2 each, Rfl: 3   ferrous sulfate 325 (65 FE) MG tablet, Take 325 mg by mouth daily with breakfast., Disp: , Rfl:    fluticasone (FLONASE) 50 MCG/ACT nasal spray, SPRAY 2 SPRAYS INTO EACH  NOSTRIL EVERY DAY (Patient taking differently: Place 1 spray into both nostrils.), Disp: 48 mL, Rfl: 6   gabapentin (NEURONTIN) 300 MG capsule, TAKE 1 CAPSULE BY MOUTH EVERYDAY AT BEDTIME, Disp: 90 capsule, Rfl: 3   hydroxychloroquine (PLAQUENIL) 200 MG tablet, Take 200 mg by mouth 2 (two) times daily. , Disp: , Rfl:    leflunomide (ARAVA) 20 MG tablet, Take 1 tablet by mouth daily., Disp: , Rfl:    leucovorin (WELLCOVORIN) 5 MG tablet, Take 5 mg by mouth daily. , Disp: , Rfl:    levothyroxine (SYNTHROID) 50 MCG tablet, TAKE 1 TABLET BY MOUTH EVERY DAY BEFORE BREAKFAST, Disp: 90 tablet, Rfl: 3   lisinopril (ZESTRIL) 20 MG tablet, Take 1 tablet (20 mg total) by mouth daily., Disp: 90 tablet, Rfl: 3   quinapril (ACCUPRIL) 40 MG tablet, TAKE 1 TABLET BY MOUTH EVERY DAY, Disp: 90 tablet, Rfl: 3   traMADol (ULTRAM) 50 MG tablet, Take 50 mg by mouth every 6 (six) hours as needed., Disp: , Rfl:    traZODone (DESYREL) 50 MG  tablet, TAKE 1-2 TABLETS BY MOUTH AT BEDTIME AS NEEDED, Disp: 180 tablet, Rfl: 0   triamcinolone ointment (KENALOG) 0.5 %, Apply 1 application topically daily as needed., Disp: , Rfl:    Turmeric 500 MG CAPS, Take by mouth daily., Disp: , Rfl:   Observations/Objective: Patient is well-developed, well-nourished in no acute distress.  Resting comfortably at home.  Head is normocephalic, atraumatic.  No labored breathing. Speech is clear and coherent with logical content.  Patient is alert and oriented at baseline.   Husband present for examination of Rash of buttocks. Vesicular rash noted of L medial gluteus. Some areas where blisters have ruptures (likely due to friction).   Assessment and Plan: 1. Herpes zoster without complication - acyclovir (ZOVIRAX) 800 MG tablet; Take 1 tablet (800 mg total) by mouth 5 (five) times daily for 7 days.  Dispense: 35 tablet; Refill: 0  Cannot tolerate Valtrex due to nausea. Will attempt trial of Zovirax instead. Continue Gabapentin. Supportive  measures reviewed. Will give topical Bactroban on the area to help prevent secondary bacterial infection.   Follow Up Instructions: I discussed the assessment and treatment plan with the patient. The patient was provided an opportunity to ask questions and all were answered. The patient agreed with the plan and demonstrated an understanding of the instructions.  A copy of instructions were sent to the patient via MyChart unless otherwise noted below.   The patient was advised to call back or seek an in-person evaluation if the symptoms worsen or if the condition fails to improve as anticipated.  Time:  I spent 10 minutes with the patient via telehealth technology discussing the above problems/concerns.    Leeanne Rio, PA-C

## 2022-04-01 NOTE — Patient Instructions (Signed)
[name], thank you for joining Leeanne Rio, PA-C for today's virtual visit.  While this provider is not your primary care provider (PCP), if your PCP is located in our provider database this encounter information will be shared with them immediately following your visit.  Consent: (Patient) Melody Moore provided verbal consent for this virtual visit at the beginning of the encounter.  Current Medications:  Current Outpatient Medications:    acetaminophen (TYLENOL) 650 MG CR tablet, Take 2 tablets (1,300 mg total) by mouth 2 (two) times daily as needed for pain., Disp: , Rfl:    amLODipine (NORVASC) 10 MG tablet, TAKE 1 TABLET BY MOUTH EVERY DAY, Disp: 90 tablet, Rfl: 3   aspirin EC 81 MG tablet, Take 81 mg by mouth daily., Disp: , Rfl:    atorvastatin (LIPITOR) 40 MG tablet, TAKE 1 TABLET BY MOUTH EVERY DAY, Disp: 90 tablet, Rfl: 3   Calcium Carb-Cholecalciferol (CALCIUM 600 + D PO), Take 2 tablets by mouth daily., Disp: , Rfl:    Cholecalciferol (VITAMIN D) 2000 units tablet, Take 2,000 Units by mouth daily., Disp: , Rfl:    cyclobenzaprine (FLEXERIL) 10 MG tablet, One half to one tab PO qHS, then increase gradually to one tab TID., Disp: 30 tablet, Rfl: 0   docusate sodium (COLACE) 100 MG capsule, Take 100 mg by mouth daily., Disp: , Rfl:    EPINEPHRINE 0.3 mg/0.3 mL IJ SOAJ injection, INJECT 0.3 MLS (0.3 MG TOTAL) INTO THE MUSCLE ONCE AS NEEDED FOR UP TO 1 DOSE., Disp: 2 each, Rfl: 3   ferrous sulfate 325 (65 FE) MG tablet, Take 325 mg by mouth daily with breakfast., Disp: , Rfl:    fluticasone (FLONASE) 50 MCG/ACT nasal spray, SPRAY 2 SPRAYS INTO EACH NOSTRIL EVERY DAY (Patient taking differently: Place 1 spray into both nostrils.), Disp: 48 mL, Rfl: 6   gabapentin (NEURONTIN) 300 MG capsule, TAKE 1 CAPSULE BY MOUTH EVERYDAY AT BEDTIME, Disp: 90 capsule, Rfl: 3   hydroxychloroquine (PLAQUENIL) 200 MG tablet, Take 200 mg by mouth 2 (two) times daily. , Disp: , Rfl:     leflunomide (ARAVA) 20 MG tablet, Take 1 tablet by mouth daily., Disp: , Rfl:    leucovorin (WELLCOVORIN) 5 MG tablet, Take 5 mg by mouth daily. , Disp: , Rfl:    levothyroxine (SYNTHROID) 50 MCG tablet, TAKE 1 TABLET BY MOUTH EVERY DAY BEFORE BREAKFAST, Disp: 90 tablet, Rfl: 3   lisinopril (ZESTRIL) 20 MG tablet, Take 1 tablet (20 mg total) by mouth daily., Disp: 90 tablet, Rfl: 3   predniSONE (STERAPRED UNI-PAK 48 TAB) 10 MG (48) TBPK tablet, Take by mouth daily. 12-day taper pack, use as directed for taper, Disp: 1 tablet, Rfl: 0   quinapril (ACCUPRIL) 40 MG tablet, TAKE 1 TABLET BY MOUTH EVERY DAY, Disp: 90 tablet, Rfl: 3   traMADol (ULTRAM) 50 MG tablet, Take 50 mg by mouth every 6 (six) hours as needed., Disp: , Rfl:    traZODone (DESYREL) 50 MG tablet, TAKE 1-2 TABLETS BY MOUTH AT BEDTIME AS NEEDED, Disp: 180 tablet, Rfl: 0   triamcinolone ointment (KENALOG) 0.5 %, Apply 1 application topically daily as needed., Disp: , Rfl:    Turmeric 500 MG CAPS, Take by mouth daily., Disp: , Rfl:    Medications ordered in this encounter:  No orders of the defined types were placed in this encounter.    *If you need refills on other medications prior to your next appointment, please contact your pharmacy*  Follow-Up:  Call back or seek an in-person evaluation if the symptoms worsen or if the condition fails to improve as anticipated.  Other Instructions Please take the antiviral medication as directed. Keep skin clean and dry. You can apply topical lidocaine cream to area if needed. Keep up with your Gabapentin and pain medications as directed by your PCP/specialists.  The Mupirocin cream is to help prevent secondary bacterial infection on the area.  If not improving or any new/worsening symptoms despite treatment, you will need to be seen in person.   Shingles  Shingles is an infection. It gives you a painful skin rash and blisters that have fluid in them. Shingles is caused by the same germ  (virus) that causes chickenpox. Shingles only happens in people who: Have had chickenpox. Have been given a shot (vaccine) to protect against chickenpox. Shingles is rare in this group. What are the causes? This condition is caused by varicella-zoster virus. This is the same germ that causes chickenpox. After a person is exposed to the germ, the germ stays in the body but is not active (dormant). Shingles develops if the germ becomes active again (is reactivated). This can happen many years after the first exposure to the germ. It is not known what causes this germ to become active again. What increases the risk? People who have had chickenpox or received the chickenpox shot are at risk for shingles. This infection is more common in people who: Are older than 73 years of age. Have a weakened disease-fighting system (immune system), such as people with: HIV (human immunodeficiency virus). AIDS (acquired immunodeficiency syndrome). Cancer. Are taking medicines that weaken the immune system, such as organ transplant medicines. Have a lot of stress. What are the signs or symptoms? The first symptoms of shingles may be itching, tingling, or pain in an area on your skin. A rash will show on your skin a few days or weeks later. This is what usually happens: The rash is likely to be on one side of your body. The rash usually has a shape like a belt or a band. Over time, the rash turns into fluid-filled blisters. The blisters will break open and change into scabs. The scabs usually dry up in about 2-3 weeks. You may also have: A fever. Chills. A headache. A feeling like you may vomit (nausea). How is this treated? The rash may last for several weeks. There is not a specific cure for this condition. Your doctor may prescribe medicines. Medicines may: Help with pain. Help you get better sooner. Help to prevent long-term problems. Help with itching (antihistamines). If the area involved is on  your face, you may need to see a specialist. This may be an eye doctor or an ear, nose, and throat (ENT) doctor. Follow these instructions at home: Medicines Take over-the-counter and prescription medicines only as told by your doctor. Put on an anti-itch cream or numbing cream where you have a rash, blisters, or scabs. Do this as told by your doctor. Helping with itching and discomfort  Put cold, wet cloths (cold compresses) on the area of the rash or blisters as told by your doctor. Cool baths can help you feel better. Try adding baking soda or dry oatmeal to the water to lessen itching. Do not bathe in hot water. Use calamine lotion as told by your doctor. Blister and rash care Keep your rash covered with a loose bandage (dressing). Wear loose clothing that does not rub on your rash. Wash your hands with  soap and water for at least 20 seconds before and after you change your bandage. If you cannot use soap and water, use hand sanitizer. Change your bandage as told by your doctor. Keep your rash and blisters clean. To do this, wash the area with mild soap and cool water as told by your doctor. Check your rash every day for signs of infection. Check for: More redness, swelling, or pain. Fluid or blood. Warmth. Pus or a bad smell. Do not scratch your rash. Do not pick at your blisters. To help you to not scratch: Keep your fingernails clean and cut short. Wear gloves or mittens when you sleep, if scratching is a problem. General instructions Rest as told by your doctor. Wash your hands often with soap and water for at least 20 seconds. If you cannot use soap and water, use hand sanitizer. Doing this lowers your chance of getting a skin infection. Your infection can cause chickenpox in people who have never had chickenpox or never got a chickenpox vaccine shot. If you have blisters that did not change into scabs yet, try not to touch other people or be around other people,  especially: Babies. Pregnant women. Children who have areas of red, itchy, or rough skin (eczema). Older people who have organ transplants. People who have a long-term (chronic) illness, like cancer or AIDS. Keep all follow-up visits. How is this prevented? A vaccine shot is the best way to prevent shingles and protect against shingles problems. If you have not had a vaccine shot, talk with your doctor about getting it. Where to find more information Centers for Disease Control and Prevention: http://www.wolf.info/ Contact a doctor if: Your pain does not get better with medicine. Your pain does not get better after the rash heals. You have any of these signs of infection around the rash: More redness, swelling, or pain. Fluid or blood. Warmth. Pus or a bad smell. You have a fever. Get help right away if: The rash is on your face or nose. You have pain in your face or pain by your eye. You lose feeling on one side of your face. You have trouble seeing. You have ear pain, or you have ringing in your ear. You have a loss of taste. Your condition gets worse. Summary Shingles gives you a painful skin rash and blisters that have fluid in them. Shingles is caused by the same germ (virus) that causes chickenpox. Keep your rash covered with a loose bandage. Wear loose clothing that does not rub on your rash. If you have blisters that did not change into scabs yet, try not to touch other people or be around people. This information is not intended to replace advice given to you by your health care provider. Make sure you discuss any questions you have with your health care provider. Document Revised: 09/15/2020 Document Reviewed: 09/15/2020 Elsevier Patient Education  [place].   If you have been instructed to have an in-person evaluation today at a local Urgent Care facility, please use the link below. It will take you to a list of all of our available [facility],  including address, phone number and hours of operation. Please do not delay care.  [facility]  If you or a family member do not have a primary care provider, use the link below to schedule a visit and establish care. When you choose a [facility] primary care physician or advanced practice provider, you gain a long-term partner  in health. Find a Primary Care Provider  Learn more about 's in-office and virtual care options: [facility]

## 2022-04-05 ENCOUNTER — Ambulatory Visit: Payer: Medicare HMO | Attending: Sports Medicine | Admitting: Rehabilitative and Restorative Service Providers"

## 2022-04-05 ENCOUNTER — Encounter: Payer: Self-pay | Admitting: Rehabilitative and Restorative Service Providers"

## 2022-04-05 DIAGNOSIS — M5416 Radiculopathy, lumbar region: Secondary | ICD-10-CM | POA: Insufficient documentation

## 2022-04-05 DIAGNOSIS — M6281 Muscle weakness (generalized): Secondary | ICD-10-CM | POA: Insufficient documentation

## 2022-04-05 NOTE — Patient Instructions (Signed)
Access Code: [email or web address] URL: [email or web address] Date: 04/05/2022 Prepared by: Rudell Cobb  Exercises - Supine Bridge  - 2 x daily - 7 x weekly - 1-3 sets - 10 reps - Supine March  - 2 x daily - 7 x weekly - 2 sets - 10 reps - Supine Quadriceps Stretch with Strap on Table  - 2 x daily - 7 x weekly - 1 sets - 3 reps - 30-60 sec hold - Supine Piriformis Stretch with Leg Straight  - 1 x daily - 7 x weekly - 1 sets - 3 reps - 30-60 hold - Supine Hamstring Stretch with Strap  - 1 x daily - 7 x weekly - 1 sets - 3 reps - 30-60 hold - Standing Anti-Rotation Press with Anchored Resistance  - 1 x daily - 7 x weekly - 3 sets - 10 reps - Shoulder extension with resistance - Neutral  - 1 x daily - 7 x weekly - 3 sets - 10 reps - Standing Single Leg Stance with Counter Support  - 1 x daily - 7 x weekly - 1 sets - 3 reps - 10-15 seconds hold

## 2022-04-05 NOTE — Therapy (Signed)
[place], [place], [address] Phone: [phone]   Fax:  [phone]  Physical Therapy Treatment  Patient Details  Name: Melody Moore MRN: [id number] Date of Birth: August 24, 1951 Referring Provider (PT): Dianah Field   Encounter Date: 04/05/2022   PT End of Session - 04/05/22 1103     Visit Number 5    Number of Visits 12    Date for PT Re-Evaluation 04/29/22    Authorization Type humana MCR    Authorization - Visit Number 5    Authorization - Number of Visits 12    Progress Note Due on Visit 10    PT Start Time 1103    PT Stop Time 1660    PT Time Calculation (min) 42 min    Activity Tolerance Patient tolerated treatment well    Behavior During Therapy [facility] for tasks assessed/performed             Past Medical History:  Diagnosis Date   Adrenal crisis ([place]) 02/12/2016   AKI (acute kidney injury) ([place]) 02/2016   Allergy    Arthritis    Carotid stenosis, asymptomatic, bilateral 05/26/2017   Dopplers 02/12/2016: right - mid-distal 40-59% stenosis; left - 40-59% stenosis   Cataract    CKD (chronic kidney disease) stage 3, GFR 30-59 ml/min ([place]) 11/02/2017   Decreased calculated GFR 06/15/2017   Gallstones 06/22/2017   History of echocardiogram 11/07/2017   04/09/2016: EF 63%, mild diastolic dysfunction, trivial MR and TR   Hypertension    Hypothyroidism    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   Subjective Assessment - 04/05/22 1106     Subjective The patient got some tingling in her R toes yesterday that began in her rear and went down the back of her leg.    Pertinent History HTN, OP, OA, B TKR    Diagnostic tests XRay: Grade 1 anterolisthesis of L4 on L5. Moderate to severe L5-S1 and  moderate L4-5 degenerative disc changes.    Patient Stated Goals maintain relief she has achieved with meds    Currently in Pain? Yes    Pain Score --   got shingles and has crusting of sores-- not sure if it is contributing to back pain   Pain Location Back    Pain Orientation Lower    Pain Descriptors / Indicators Aching;Sore    Pain Type Chronic pain    Pain Onset More than a month ago    Pain Frequency Intermittent    Aggravating Factors  unsure    Pain Relieving Factors sit or lie down                [facility] PT Assessment - 04/05/22 1106       Assessment   Medical Diagnosis M54.16 (ICD-10-CM) - Right lumbar radiculitis    Referring Provider (PT) Thekkekandam    Onset Date/Surgical Date 02/25/22  [facility] Adult PT Treatment/Exercise - 04/05/22 1106       Exercises   Exercises Lumbar;Knee/Hip      Lumbar Exercises: Stretches   Piriformis Stretch Right;Left;1 rep;30 seconds    Other Lumbar Stretch Exercise lower trunk rotation on physioball    Other Lumbar Stretch Exercise neural glide supine sciatic with strap and HS with neck flexion      Lumbar Exercises: Aerobic   Recumbent Bike L4 x 5 minutes; UEs/LEs      Lumbar Exercises: Standing   Heel Raises 15 reps    Shoulder Extension AROM;Strengthening;Right;Left;10 reps    Theraband Level (Shoulder Extension) Level 2 (Red)    Other Standing Lumbar Exercises bow and arrow with scap retraction red bands    Other Standing Lumbar Exercises sidestepping x 8 feet x 3 reps; hip extension x 10 reps      Lumbar Exercises: Seated   Sit  to Stand 10 reps      Lumbar Exercises: Supine   Pelvic Tilt 10 reps    Pelvic Tilt Limitations core engaged with  marching    Bridge Compliant;15 reps    Bridge Limitations with green physioball    Isometric Hip Flexion --   3 reps with 5 second holds R and L sides     Lumbar Exercises: Sidelying   Hip Abduction Right;Left;10 reps    Hip Abduction Limitations cues for technique      Knee/Hip Exercises: Standing   Heel Raises 20 reps    Forward Step Up Right;Left;10 reps    Forward Step Up Limitations step ups and alternating step downs with bilat UE support    SLS near a countertop dec'ing UE support                     PT Education - 04/05/22 1142     Education Details HEP    Person(s) Educated Patient    Methods Explanation;Demonstration;Handout    Comprehension Verbalized understanding;Returned demonstration              PT Short Term Goals - 04/01/22 1322       PT SHORT TERM GOAL #1   Title ind with initial HEP    Status Achieved               PT Long Term Goals - 04/01/22 1322       PT LONG TERM GOAL #1   Title Decreased LBP and radicular sx by 75% or more to improve QOL.    Status Achieved      PT LONG TERM GOAL #2   Title improved B LE strength to 4+/5 or better to improve function.    Status On-going      PT LONG TERM GOAL #3   Title improved core strength with functional activities as evidenced by stabilization with MMT.    Status On-going      PT LONG TERM GOAL #4   Title Independent in HEP    Status On-going                   Plan - 04/05/22 1238     Clinical Impression Statement [name] is continuing to progress with LE strength and activities in therapy.  She notes intermittent episodes of low back pain with radiating symptoms to toes on R.  Her L Low back has had increased pain due to shingles, which are healing.  PT to continue to progress to LTGs.    PT Frequency  2x / week    PT Duration 6 weeks    PT  Treatment/Interventions ADLs/Self Care Home Management;Cryotherapy;Electrical Stimulation;Iontophoresis '4mg'$ /ml Dexamethasone;Moist Heat;Therapeutic exercise;Therapeutic activities;Stair training;Neuromuscular re-education;Patient/family education;Dry needling;Manual techniques;Taping    PT Next Visit Plan work on core and LE strengthening, MT to lumbar/gluteals    PT Home Exercise Plan [email or web address]    Consulted and Agree with Plan of Care Patient             Patient will benefit from skilled therapeutic intervention in order to improve the following deficits and impairments:  Decreased range of motion, Increased muscle spasms, Pain, Decreased activity tolerance, Hypomobility, Impaired flexibility, Postural dysfunction, Decreased strength  Visit Diagnosis: Radiculopathy, lumbar region  Muscle weakness (generalized)     Problem List Patient Active Problem List   Diagnosis Date Noted   Right lumbar radiculitis 03/05/2022   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 ([place]) 07/03/2021   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   Primary generalized (osteo)arthritis 07/03/2021   COVID-19 02/23/2021   Postherpetic neuralgia 05/21/2019   Normocytic anemia 02/22/2018   Encounter for monitoring statin therapy 02/22/2018   Dyslipidemia 02/22/2018   Primary osteoarthritis of right knee 12/16/2017   Benign hypertension with CKD (chronic kidney disease) stage III ([place]) 12/04/2017   History of echocardiogram 11/07/2017   Chronic kidney disease, stage 3a ([place]) 11/02/2017   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   Gallstones 06/22/2017   Elevated alkaline phosphatase level 06/15/2017   Transaminitis 06/15/2017   Acquired hypothyroidism 05/26/2017   Essential hypertension 05/26/2017   H/O total knee replacement, left 05/26/2017   History of adrenal insufficiency 05/26/2017   Rheumatoid arthritis involving multiple sites ([place]) 05/26/2017   Allergic rhinitis 05/26/2017   Right carotid bruit  05/26/2017   Carotid stenosis, asymptomatic, bilateral 05/26/2017   Allergy to honey bee venom 05/26/2017    Brandan Robicheaux, PT 04/05/2022, 12:47 PM  [facility] [place], [place], [address] Phone: [phone]   Fax:  [phone]  Name: Meranda Dechaine MRN: [id number] Date of Birth: 15-Nov-1950

## 2022-04-08 ENCOUNTER — Encounter: Payer: Self-pay | Admitting: Physical Therapy

## 2022-04-08 ENCOUNTER — Ambulatory Visit: Payer: Medicare HMO | Admitting: Physical Therapy

## 2022-04-08 DIAGNOSIS — M5416 Radiculopathy, lumbar region: Secondary | ICD-10-CM

## 2022-04-08 DIAGNOSIS — M6281 Muscle weakness (generalized): Secondary | ICD-10-CM

## 2022-04-08 NOTE — Therapy (Addendum)
[facility] [address] [place], [place], [address] Phone: [phone]   Fax:  [phone]  Physical Therapy Treatment and Discharge Summary  Patient Details  Name: Melody Moore MRN: [id number] Date of Birth: 03-Nov-1950 Referring Provider (PT): Dianah Field   Encounter Date: 04/08/2022   PT End of Session - 04/08/22 1103     Visit Number 6    Number of Visits 12    Date for PT Re-Evaluation 04/29/22    Authorization Type humana MCR    Authorization - Visit Number 6    Authorization - Number of Visits 12    Progress Note Due on Visit 10    PT Start Time 1100    PT Stop Time 1140    PT Time Calculation (min) 40 min    Activity Tolerance Patient tolerated treatment well    Behavior During Therapy [facility] for tasks assessed/performed             Past Medical History:  Diagnosis Date   Adrenal crisis ([place]) 02/12/2016   AKI (acute kidney injury) ([place]) 02/2016   Allergy    Arthritis    Carotid stenosis, asymptomatic, bilateral 05/26/2017   Dopplers 02/12/2016: right - mid-distal 40-59% stenosis; left - 40-59% stenosis   Cataract    CKD (chronic kidney disease) stage 3, GFR 30-59 ml/min ([place]) 11/02/2017   Decreased calculated GFR 06/15/2017   Gallstones 06/22/2017   History of echocardiogram 11/07/2017   04/09/2016: EF 69%, mild diastolic dysfunction, trivial MR and TR   Hypertension    Hypothyroidism    Immunosuppression due to drug therapy ([place]) 11/02/2017   Rheumatoid arthritis ([place])    Rheumatoid arthritis involving multiple sites ([place]) 05/26/2017   Transaminitis 06/15/2017   Normal abdominal US 06/22/17    Past Surgical History:  Procedure Laterality Date   APPENDECTOMY     CATARACT EXTRACTION, BILATERAL  2015   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   TOTAL KNEE ARTHROPLASTY Right 12/16/2017   Procedure: RIGHT TOTAL KNEE ARTHROPLASTY;  Surgeon: Dorna Leitz, MD;  Location: [facility];  Service: Orthopedics;   Laterality: Right;  Adductor Block    There were no vitals filed for this visit.   Subjective Assessment - 04/08/22 1104     Subjective Melody Moore reporting that she's feeling her R HS today, mainly tightness    Pertinent History HTN, OP, OA, B TKR    Diagnostic tests XRay: Grade 1 anterolisthesis of L4 on L5. Moderate to severe L5-S1 and  moderate L4-5 degenerative disc changes.    Patient Stated Goals maintain relief she has achieved with meds    Currently in Pain? No/denies                               [facility] Adult PT Treatment/Exercise - 04/08/22 0001       Lumbar Exercises: Stretches   Passive Hamstring Stretch Left;1 rep;30 seconds    Piriformis Stretch Right;Left;1 rep;30 seconds    Other Lumbar Stretch Exercise lower trunk rotation on physioball    Other Lumbar Stretch Exercise neural glide supine sciatic with strap and HS with neck flexion x 10 ea      Lumbar Exercises: Aerobic   Recumbent Bike L4 x 5 minutes      Lumbar Exercises: Standing   Heel Raises 15 reps      Lumbar Exercises: Seated   Sit to Stand 20 reps    Sit to Stand Limitations  5#KB to [facility] lift      Lumbar Exercises: Supine   Pelvic Tilt 10 reps    Pelvic Tilt Limitations core engaged with  marching    Bridge Limitations held due to HS today    Other Supine Lumbar Exercises Green Pball marching x 10 ea, LAQ x 4 both difficult; then did on dyna disc x 5 ea      Lumbar Exercises: Sidelying   Clam Right;Left;20 reps    Clam Limitations RTB L only    Hip Abduction Right;Left;20 reps    Hip Abduction Limitations RTB on L only                       PT Short Term Goals - 04/01/22 1322       PT SHORT TERM GOAL #1   Title ind with initial HEP    Status Achieved               PT Long Term Goals - 04/01/22 1322       PT LONG TERM GOAL #1   Title Decreased LBP and radicular sx by 75% or more to improve QOL.    Status Achieved      PT LONG TERM GOAL #2   Title  improved B LE strength to 4+/5 or better to improve function.    Status On-going      PT LONG TERM GOAL #3   Title improved core strength with functional activities as evidenced by stabilization with MMT.    Status On-going      PT LONG TERM GOAL #4   Title Independent in HEP    Status On-going                   Plan - 04/08/22 1145     Clinical Impression Statement Melody Moore had some increased HS tightness today on the left so we held bridges. Seated Pball exercises were difficult. She could manage dynadisc much better but demos R lumbar weakness.    PT Treatment/Interventions ADLs/Self Care Home Management;Cryotherapy;Electrical Stimulation;Iontophoresis 97m/ml Dexamethasone;Moist Heat;Therapeutic exercise;Therapeutic activities;Stair training;Neuromuscular re-education;Patient/family education;Dry needling;Manual techniques;Taping    PT Next Visit Plan work on core and LE strengthening, MT to lumbar/gluteals             Patient will benefit from skilled therapeutic intervention in order to improve the following deficits and impairments:  Decreased range of motion, Increased muscle spasms, Pain, Decreased activity tolerance, Hypomobility, Impaired flexibility, Postural dysfunction, Decreased strength  Visit Diagnosis: Radiculopathy, lumbar region  Muscle weakness (generalized)     Problem List Patient Active Problem List   Diagnosis Date Noted   Right lumbar radiculitis 03/05/2022   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 ([place] 07/03/2021   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   Primary generalized (osteo)arthritis 07/03/2021   COVID-19 02/23/2021   Postherpetic neuralgia 05/21/2019   Normocytic anemia 02/22/2018   Encounter for monitoring statin therapy 02/22/2018   Dyslipidemia 02/22/2018   Primary osteoarthritis of right knee 12/16/2017   Benign hypertension with CKD (chronic kidney disease) stage III ([place] 12/04/2017   History of echocardiogram 11/07/2017   Chronic  kidney disease, stage 3a ([place] 11/02/2017   Immunosuppression due to drug therapy ([place] 11/02/2017   Gallstones 06/22/2017   Elevated alkaline phosphatase level 06/15/2017   Transaminitis 06/15/2017   Acquired hypothyroidism 05/26/2017   Essential hypertension 05/26/2017   H/O total knee replacement, left 05/26/2017   History of adrenal insufficiency 05/26/2017   Rheumatoid arthritis involving multiple sites ([place] 05/26/2017  Allergic rhinitis 05/26/2017   Right carotid bruit 05/26/2017   Carotid stenosis, asymptomatic, bilateral 05/26/2017   Allergy to honey bee venom 05/26/2017   Madelyn Flavors, PT 04/08/2022, 11:51 AM  [facility] [place], [place], [address] Phone: [phone]   Fax:  [phone]  Name: Melody Moore MRN: [id number] Date of Birth: May 06, 1951   PHYSICAL THERAPY DISCHARGE SUMMARY  Visits from Start of Care: 6  Current functional level related to goals / functional outcomes: unknown   Remaining deficits: unknown   Education / Equipment: HEP   Patient agrees to discharge. Patient goals were partially met. Patient is being discharged due to not returning since the last visit.  Madelyn Flavors, PT 06/08/22 10:10 AM

## 2022-04-13 ENCOUNTER — Encounter: Payer: Medicare HMO | Admitting: Physical Therapy

## 2022-04-15 ENCOUNTER — Encounter: Payer: Medicare HMO | Admitting: Physical Therapy

## 2022-04-16 ENCOUNTER — Ambulatory Visit (INDEPENDENT_AMBULATORY_CARE_PROVIDER_SITE_OTHER): Payer: Medicare HMO | Admitting: Sports Medicine

## 2022-04-16 DIAGNOSIS — M5416 Radiculopathy, lumbar region: Secondary | ICD-10-CM | POA: Diagnosis not present

## 2022-04-16 MED ORDER — TRIAZOLAM 0.25 MG PO TABS: ORAL_TABLET | ORAL | 0 refills | Status: DC

## 2022-04-16 NOTE — Progress Notes (Signed)
    Procedures performed today:    None.  Independent interpretation of notes and tests performed by another provider:   None.  Brief History, Exam, Impression, and Recommendations:    Right lumbar radiculitis This is a very pleasant 71 year old female, she has a history of rheumatoid arthritis currently on leflunomide, long history of axial back pain with radiation down both legs, predominantly right-sided, to the middle 3 toes suggestive of a right L5 radiculitis. At this point she has failed physical therapy, steroids. She did get temporary and partial relief. We will at this point proceed with MRI for epidural planning, likely right L5-S1 transforaminal. I will go ahead and order the epidural as soon as I see the MRI results, and she will need triazolam for preprocedural anxiolysis. Continue gabapentin for now.  Chronic process with exacerbation and pharmacologic intervention  ____________________________________________ Gwen Her. Dianah Field, M.D., ABFM., CAQSM., AME. Primary Care and Sports Medicine [facility]  Adjunct Professor of [place] [employer]  Risk manager

## 2022-04-16 NOTE — Assessment & Plan Note (Addendum)
This is a very pleasant 71 year old female, she has a history of rheumatoid arthritis currently on leflunomide, long history of axial back pain with radiation down both legs, predominantly right-sided, to the middle 3 toes suggestive of a right L5 radiculitis. At this point she has failed physical therapy, steroids. She did get temporary and partial relief. We will at this point proceed with MRI for epidural planning, likely right L5-S1 transforaminal. I will go ahead and order the epidural as soon as I see the MRI results, and she will need triazolam for preprocedural anxiolysis. Continue gabapentin for now.

## 2022-04-24 ENCOUNTER — Ambulatory Visit (INDEPENDENT_AMBULATORY_CARE_PROVIDER_SITE_OTHER): Payer: Medicare HMO

## 2022-04-24 DIAGNOSIS — M545 Low back pain, unspecified: Secondary | ICD-10-CM

## 2022-04-24 DIAGNOSIS — M5416 Radiculopathy, lumbar region: Secondary | ICD-10-CM | POA: Diagnosis not present

## 2022-04-24 DIAGNOSIS — M48061 Spinal stenosis, lumbar region without neurogenic claudication: Secondary | ICD-10-CM | POA: Diagnosis not present

## 2022-04-24 DIAGNOSIS — M5126 Other intervertebral disc displacement, lumbar region: Secondary | ICD-10-CM | POA: Diagnosis not present

## 2022-04-26 ENCOUNTER — Encounter (INDEPENDENT_AMBULATORY_CARE_PROVIDER_SITE_OTHER): Payer: Medicare HMO | Admitting: Sports Medicine

## 2022-04-26 ENCOUNTER — Telehealth: Payer: Self-pay

## 2022-04-26 DIAGNOSIS — M5416 Radiculopathy, lumbar region: Secondary | ICD-10-CM | POA: Diagnosis not present

## 2022-04-26 NOTE — Telephone Encounter (Signed)
Patient called to state that she would like to proceed with the epidural.

## 2022-04-26 NOTE — Telephone Encounter (Signed)
I spent 5 total minutes of online digital evaluation and management services in this patient-initiated request for online care. 

## 2022-04-30 ENCOUNTER — Ambulatory Visit
Admission: RE | Admit: 2022-04-30 | Discharge: 2022-04-30 | Disposition: A | Discharge status: Home / Self Care | Payer: Medicare HMO | Source: Ambulatory Visit | Attending: Sports Medicine | Admitting: Sports Medicine

## 2022-04-30 DIAGNOSIS — M5416 Radiculopathy, lumbar region: Secondary | ICD-10-CM

## 2022-04-30 DIAGNOSIS — M47817 Spondylosis without myelopathy or radiculopathy, lumbosacral region: Secondary | ICD-10-CM | POA: Diagnosis not present

## 2022-04-30 DIAGNOSIS — M48061 Spinal stenosis, lumbar region without neurogenic claudication: Secondary | ICD-10-CM | POA: Diagnosis not present

## 2022-04-30 MED ORDER — IOPAMIDOL (ISOVUE-M 200) INJECTION 41%
1.0000 mL | Freq: Once | INTRAMUSCULAR | Status: AC
  Administered 2022-04-30: 1 mL via EPIDURAL

## 2022-04-30 MED ORDER — METHYLPREDNISOLONE ACETATE 40 MG/ML INJ SUSP (RADIOLOG
80.0000 mg | Freq: Once | INTRAMUSCULAR | Status: AC
  Administered 2022-04-30: 80 mg via EPIDURAL

## 2022-04-30 NOTE — Discharge Instructions (Signed)

## 2022-05-11 ENCOUNTER — Ambulatory Visit (INDEPENDENT_AMBULATORY_CARE_PROVIDER_SITE_OTHER): Payer: Medicare HMO

## 2022-05-11 ENCOUNTER — Ambulatory Visit
Admission: EM | Admit: 2022-05-11 | Discharge: 2022-05-11 | Disposition: A | Discharge status: Home / Self Care | Payer: Medicare HMO | Attending: Family Medicine | Admitting: Family Medicine

## 2022-05-11 DIAGNOSIS — M25532 Pain in left wrist: Secondary | ICD-10-CM | POA: Diagnosis not present

## 2022-05-11 DIAGNOSIS — M19032 Primary osteoarthritis, left wrist: Secondary | ICD-10-CM | POA: Diagnosis not present

## 2022-05-11 DIAGNOSIS — S60012A Contusion of left thumb without damage to nail, initial encounter: Secondary | ICD-10-CM

## 2022-05-11 DIAGNOSIS — S6992XA Unspecified injury of left wrist, hand and finger(s), initial encounter: Secondary | ICD-10-CM | POA: Diagnosis not present

## 2022-05-11 DIAGNOSIS — M79642 Pain in left hand: Secondary | ICD-10-CM

## 2022-05-11 DIAGNOSIS — S60212A Contusion of left wrist, initial encounter: Secondary | ICD-10-CM

## 2022-05-11 DIAGNOSIS — M7989 Other specified soft tissue disorders: Secondary | ICD-10-CM | POA: Diagnosis not present

## 2022-05-11 MED ORDER — TRAMADOL HCL 50 MG PO TABS: 50.0000 mg | ORAL_TABLET | Freq: Every day | ORAL | 0 refills | Status: DC | PRN

## 2022-05-11 NOTE — Discharge Instructions (Addendum)
Advised/informed patient of left wrist/left hand x-rays with hard copy provided to patient and spouse.  Advised patient to price left hand/left wrist 25 minutes 3 times daily for the next 3 days.  Advised may use Tramadol daily/as needed for left wrist/left hand pain.  Advised patient if symptoms worsen and/or unresolved please follow-up with [place] orthopedic/s[facility] medicine provider contact information is provided below.

## 2022-05-11 NOTE — ED Provider Notes (Signed)
Melody Moore CARE    CSN: [id number] Arrival date & time: 05/11/22  0809      History   Chief Complaint Chief Complaint  Patient presents with   Hand Injury    LT    HPI Melody Moore is a 71 y.o. female.   HPI Very pleasant 71 year old female presents with left hand/wrist injury since 7 AM yesterday.  Patient reports fell on outstretched arm and caught herself on the left wrist.  Currently reports pain as 6/10 and has been using ice and tramadol as needed.  PMH significant for adrenal crisis, asymptomatic bilateral carotid stenosis and CKD stage III.  Patient is accompanied by her husband this morning.  Past Medical History:  Diagnosis Date   Adrenal crisis ([place]) 02/12/2016   AKI (acute kidney injury) ([place]) 02/2016   Allergy    Arthritis    Carotid stenosis, asymptomatic, bilateral 05/26/2017   Dopplers 02/12/2016: right - mid-distal 40-59% stenosis; left - 40-59% stenosis   Cataract    CKD (chronic kidney disease) stage 3, GFR 30-59 ml/min ([facility]) 11/02/2017   Decreased calculated GFR 06/15/2017   Gallstones 06/22/2017   History of echocardiogram 11/07/2017   04/09/2016: EF 67%, mild diastolic dysfunction, trivial MR and TR   Hypertension    Hypothyroidism    Immunosuppression due to drug therapy ([place]) 11/02/2017   Rheumatoid arthritis ([place])    Rheumatoid arthritis involving multiple sites ([place]) 05/26/2017   Transaminitis 06/15/2017   Normal abdominal US 06/22/17    Patient Active Problem List   Diagnosis Date Noted   Right lumbar radiculitis 03/05/2022   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 ([place]) 07/03/2021   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   Primary generalized (osteo)arthritis 07/03/2021   COVID-19 02/23/2021   Postherpetic neuralgia 05/21/2019   Normocytic anemia 02/22/2018   Encounter for monitoring statin therapy 02/22/2018   Dyslipidemia 02/22/2018   Primary osteoarthritis of right knee 12/16/2017   Benign hypertension with CKD (chronic kidney disease) stage III  ([place]) 12/04/2017   History of echocardiogram 11/07/2017   Chronic kidney disease, stage 3a ([place]) 11/02/2017   Immunosuppression due to drug therapy ([place]) 11/02/2017   Gallstones 06/22/2017   Elevated alkaline phosphatase level 06/15/2017   Transaminitis 06/15/2017   Acquired hypothyroidism 05/26/2017   Essential hypertension 05/26/2017   H/O total knee replacement, left 05/26/2017   History of adrenal insufficiency 05/26/2017   Rheumatoid arthritis involving multiple sites ([place]) 05/26/2017   Allergic rhinitis 05/26/2017   Right carotid bruit 05/26/2017   Carotid stenosis, asymptomatic, bilateral 05/26/2017   Allergy to honey bee venom 05/26/2017    Past Surgical History:  Procedure Laterality Date   APPENDECTOMY     CATARACT EXTRACTION, BILATERAL  2015   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   TOTAL KNEE ARTHROPLASTY Right 12/16/2017   Procedure: RIGHT TOTAL KNEE ARTHROPLASTY;  Surgeon: Dorna Leitz, MD;  [facility];  Service: Orthopedics;  Laterality: Right;  Adductor Block    OB History   No obstetric history on file.      Home Medications    Prior to Admission medications   Medication Sig Start Date End Date Taking? Authorizing Provider  traMADol (ULTRAM) 50 MG tablet Take 1 tablet (50 mg total) by mouth daily as needed. 05/11/22  Yes Eliezer Lofts, FNP  acetaminophen (TYLENOL) 650 MG CR tablet Take 2 tablets (1,300 mg total) by mouth 2 (two) times daily as needed for pain. 02/22/18   Trixie Dredge, PA-C  amLODipine (NORVASC) 10 MG tablet TAKE 1 TABLET BY MOUTH EVERY DAY  07/15/21   Luetta Nutting, DO  aspirin EC 81 MG tablet Take 81 mg by mouth daily.    [provider]  atorvastatin (LIPITOR) 40 MG tablet TAKE 1 TABLET BY MOUTH EVERY DAY 07/06/21   Luetta Nutting, DO  Calcium Carb-Cholecalciferol (CALCIUM 600 + D PO) Take 2 tablets by mouth daily.    [provider]  Cholecalciferol (VITAMIN D) 2000 units tablet Take 2,000 Units by mouth  daily.    [provider]  cyclobenzaprine (FLEXERIL) 10 MG tablet One half to one tab PO qHS, then increase gradually to one tab TID. 03/05/22   Silverio Decamp, MD  docusate sodium (COLACE) 100 MG capsule Take 100 mg by mouth daily.    [provider]  EPINEPHRINE 0.3 mg/0.3 mL IJ SOAJ injection INJECT 0.3 MLS (0.3 MG TOTAL) INTO THE MUSCLE ONCE AS NEEDED FOR UP TO 1 DOSE. 06/04/21   Luetta Nutting, DO  ferrous sulfate 325 (65 FE) MG tablet Take 325 mg by mouth daily with breakfast.    [provider]  fluticasone (FLONASE) 50 MCG/ACT nasal spray SPRAY 2 SPRAYS INTO EACH NOSTRIL EVERY DAY Patient taking differently: Place 1 spray into both nostrils. 02/23/21   Luetta Nutting, DO  gabapentin (NEURONTIN) 300 MG capsule TAKE 1 CAPSULE BY MOUTH EVERYDAY AT BEDTIME 12/11/21   Luetta Nutting, DO  hydroxychloroquine (PLAQUENIL) 200 MG tablet Take 200 mg by mouth 2 (two) times daily.     [provider]  leflunomide (ARAVA) 20 MG tablet Take 1 tablet by mouth daily. 03/13/20   [provider]  leucovorin (WELLCOVORIN) 5 MG tablet Take 5 mg by mouth daily.     [provider]  levothyroxine (SYNTHROID) 50 MCG tablet TAKE 1 TABLET BY MOUTH EVERY DAY BEFORE BREAKFAST 10/01/21   Luetta Nutting, DO  lisinopril (ZESTRIL) 20 MG tablet Take 1 tablet (20 mg total) by mouth daily. 10/22/21   Luetta Nutting, DO  traZODone (DESYREL) 50 MG tablet TAKE 1-2 TABLETS BY MOUTH AT BEDTIME AS NEEDED 02/18/22   Luetta Nutting, DO  triamcinolone ointment (KENALOG) 0.5 % Apply 1 application topically daily as needed. 12/09/19   [provider]  triazolam (HALCION) 0.25 MG tablet 1-2 tabs PO 2 hours before procedure or imaging.  Do not drive with this medication. 04/16/22   Silverio Decamp, MD  Turmeric 500 MG CAPS Take by mouth daily.    [provider]    Family History Family History  Problem Relation Age of Onset   Diabetes Mother    Lung cancer  Father    Hypertension Father    Uterine cancer Sister     Social History Social History   Tobacco Use   Smoking status: Former    Types: Cigarettes    Quit date: 02/07/1974    Years since quitting: 48.2   Smokeless tobacco: Never  Vaping Use   Vaping Use: Never used  Substance Use Topics   Alcohol use: Yes    Comment: occasionally   Drug use: No     Allergies   Bee venom, Methotrexate derivatives, Oxycodone, Prednisone, Verapamil, Latex, Nickel, Nitrofuran derivatives, and Sulfasalazine   Review of Systems Review of Systems  Musculoskeletal:        Left wrist/left hand pain x2 days     Physical Exam Triage Vital Signs ED Triage Vitals [05/11/22 0821]  Enc Vitals Group     BP 138/80     Pulse Rate 84     Resp 17  Temp 97.8 F (36.6 C)     Temp Source Oral     SpO2 99 %     Weight      Height      Head Circumference      Peak Flow      Pain Score 8     Pain Loc      Pain Edu?      Excl. in [place]?    No data found.  Updated Vital Signs BP 138/80 (BP Location: Right Arm)   Pulse 84   Temp 97.8 F (36.6 C) (Oral)   Resp 17   SpO2 99%      Physical Exam Vitals and nursing note reviewed.  Constitutional:      Appearance: Normal appearance. She is obese.  HENT:     Head: Normocephalic and atraumatic.     Mouth/Throat:     Mouth: Mucous membranes are moist.     Pharynx: Oropharynx is clear.  Eyes:     Extraocular Movements: Extraocular movements intact.     Conjunctiva/sclera: Conjunctivae normal.     Pupils: Pupils are equal, round, and reactive to light.  Cardiovascular:     Rate and Rhythm: Normal rate and regular rhythm.     Pulses: Normal pulses.     Heart sounds: Normal heart sounds. No murmur heard. Pulmonary:     Effort: Pulmonary effort is normal.     Breath sounds: Normal breath sounds. No wheezing, rhonchi or rales.  Musculoskeletal:        General: Normal range of motion.     Cervical back: Normal range of motion and neck  supple.     Comments: Left wrist/left hand (dorsum): Significant soft tissue swelling with ecchymosis noted, LROM with flexion/extension/ulnar deviation, grip is 1/5, neurovascular/neurosensory intact, brisk cap refill-exam limited due to pain this morning  Skin:    General: Skin is warm.  Neurological:     General: No focal deficit present.     Mental Status: She is alert and oriented to person, place, and time.      UC Treatments / Results  Labs (all labs ordered are listed, but only abnormal results are displayed) Labs Reviewed - No data to display  EKG   Radiology DG Wrist Complete Left  Result Date: 05/11/2022 CLINICAL DATA:  Patient reports fall with direct trauma to the left wrist. Bruising on the navicular side of left wrist and left thumb. EXAM: LEFT WRIST - COMPLETE 3+ VIEW; LEFT HAND - COMPLETE 3+ VIEW COMPARISON:  None Available. FINDINGS: There is no evidence of fracture or dislocation. 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 Moderate osteoarthritis of the STT joint. Diffuse soft tissue swelling about the wrist. IMPRESSION: 1. No acute osseous abnormality. Diffuse soft tissue swelling is noted about the wrist. 2. Moderate-to-severe osteoarthritis in the hand and wrist as described in the body of the report. Electronically Signed   By: Ileana Roup M.D.   On: 05/11/2022 09:05   DG Hand Complete Left  Result Date: 05/11/2022 CLINICAL DATA:  Patient reports fall with direct trauma to the left wrist. Bruising on the navicular side of left wrist and left thumb. EXAM: LEFT WRIST - COMPLETE 3+ VIEW; LEFT HAND - COMPLETE 3+ VIEW COMPARISON:  None Available. FINDINGS: There is no evidence of fracture or dislocation. 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 Moderate osteoarthritis of the STT joint. Diffuse soft tissue swelling  about the wrist. IMPRESSION: 1. No acute osseous abnormality. Diffuse soft  tissue swelling is noted about the wrist. 2. Moderate-to-severe osteoarthritis in the hand and wrist as described in the body of the report. Electronically Signed   By: Ileana Roup M.D.   On: 05/11/2022 09:05    Procedures Procedures (including critical care time)  Medications Ordered in UC Medications - No data to display  Initial Impression / Assessment and Plan / UC Course  I have reviewed the triage vital signs and the nursing notes.  Pertinent labs & imaging results that were available during my care of the patient were reviewed by me and considered in my medical decision making (see chart for details).     MDM: 1.  Left hand pain-left hand x-ray revealed above, Rx'd Tramadol; 2.  Left wrist pain-left wrist x-ray revealed above. Advised/informed patient of left wrist/left hand x-rays with hard copy provided to patient and spouse.  Advised patient to price left hand/left wrist 25 minutes 3 times daily for the next 3 days.  Advised may use Tramadol daily/as needed for left wrist/left hand pain.  Advised patient if symptoms worsen and/or unresolved please follow-up with [place] orthopedic/s[facility] medicine provider contact information is provided below.  Patient discharged home, hemodynamically stable.  Final Clinical Impressions(s) / UC Diagnoses   Final diagnoses:  Left wrist pain  Left hand pain     Discharge Instructions      Advised/informed patient of left wrist/left hand x-rays with hard copy provided to patient and spouse.  Advised patient to price left hand/left wrist 25 minutes 3 times daily for the next 3 days.  Advised may use Tramadol daily/as needed for left wrist/left hand pain.  Advised patient if symptoms worsen and/or unresolved please follow-up with [place] orthopedic/sports medicine provider contact information is provided below.     ED Prescriptions     Medication Sig Dispense Auth. Provider   traMADol (ULTRAM) 50 MG tablet Take 1 tablet (50 mg total)  by mouth daily as needed. 30 tablet Eliezer Lofts, FNP      I have reviewed the PDMP during this encounter.   Eliezer Lofts, [place] 05/11/22 [phone]

## 2022-05-11 NOTE — ED Triage Notes (Signed)
Pt c/o LT hand/wrist injury since 7am yesterday. Says she fell and caught herself on LT wrist. Pain 6/10 Ice and tramadol prn.

## 2022-05-12 ENCOUNTER — Telehealth: Payer: Self-pay | Admitting: Emergency Medicine

## 2022-05-12 NOTE — Telephone Encounter (Signed)
[place].  Advised if doing well, can disregard the call.  Any questions or concerns, feel free to contact the office.

## 2022-05-13 ENCOUNTER — Other Ambulatory Visit: Payer: Self-pay | Admitting: Family Medicine

## 2022-05-24 ENCOUNTER — Encounter: Payer: Self-pay | Admitting: Family Medicine

## 2022-05-24 ENCOUNTER — Ambulatory Visit: Payer: Medicare HMO | Admitting: Family Medicine

## 2022-05-24 ENCOUNTER — Ambulatory Visit (HOSPITAL_BASED_OUTPATIENT_CLINIC_OR_DEPARTMENT_OTHER)
Admission: RE | Admit: 2022-05-24 | Discharge: 2022-05-24 | Disposition: A | Discharge status: Home / Self Care | Payer: Medicare HMO | Source: Ambulatory Visit | Attending: Family Medicine | Admitting: Family Medicine

## 2022-05-24 ENCOUNTER — Ambulatory Visit: Payer: Self-pay

## 2022-05-24 VITALS — BP 160/82 | Ht 67.0 in | Wt 182.0 lb

## 2022-05-24 DIAGNOSIS — M19032 Primary osteoarthritis, left wrist: Secondary | ICD-10-CM | POA: Insufficient documentation

## 2022-05-24 DIAGNOSIS — S52552A Other extraarticular fracture of lower end of left radius, initial encounter for closed fracture: Secondary | ICD-10-CM

## 2022-05-24 DIAGNOSIS — S52502A Unspecified fracture of the lower end of left radius, initial encounter for closed fracture: Secondary | ICD-10-CM | POA: Insufficient documentation

## 2022-05-24 DIAGNOSIS — M25532 Pain in left wrist: Secondary | ICD-10-CM

## 2022-05-24 DIAGNOSIS — M7989 Other specified soft tissue disorders: Secondary | ICD-10-CM | POA: Diagnosis not present

## 2022-05-24 NOTE — Progress Notes (Signed)
  Melody Moore - 71 y.o. female MRN [id number]  Date of birth: 25-Dec-1950  SUBJECTIVE:  Including CC & ROS.  No chief complaint on file.   Melody Moore is a 71 y.o. female that is presenting with acute left wrist pain.  She had a fall on 8/8.  She fell back and hit her wrist on a cabinet.  Continues to have pain.  She has significant swelling and bruising at the site.  No altered sensation.  Review of the urgent care note from 8/8 shows she was provided tramadol Independent review of the left hand x-ray from 8/8 shows severe CMC arthritis. Independent review of the left wrist x-ray shows no acute bony changes.  Review of Systems See HPI   HISTORY: Past Medical, Surgical, Social, and Family History Reviewed & Updated per EMR.   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/2016   AKI (acute kidney injury) ([place]) 02/2016   Allergy    Arthritis    Carotid stenosis, asymptomatic, bilateral 05/26/2017   Dopplers 02/12/2016: right - mid-distal 40-59% stenosis; left - 40-59% stenosis   Cataract    CKD (chronic kidney disease) stage 3, GFR 30-59 ml/min (HCC) 11/02/2017   Decreased calculated GFR 06/15/2017   Gallstones 06/22/2017   History of echocardiogram 11/07/2017   04/09/2016: EF 99%, mild diastolic dysfunction, trivial MR and TR   Hypertension    Hypothyroidism    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   Rheumatoid arthritis ([place])    Rheumatoid arthritis involving multiple sites ([place]) 05/26/2017   Transaminitis 06/15/2017   Normal abdominal US 06/22/17    Past Surgical History:  Procedure Laterality Date   APPENDECTOMY     CATARACT EXTRACTION, BILATERAL  2015   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   TOTAL KNEE ARTHROPLASTY Right 12/16/2017   Procedure: RIGHT TOTAL KNEE ARTHROPLASTY;  Surgeon: Dorna Leitz, MD;  Location: [facility];  Service: Orthopedics;  Laterality: Right;  Adductor Block     PHYSICAL  EXAM:  VS: BP (!) 160/82 (BP Location: Left Arm, Patient Position: Sitting)   Ht '5\' 7"'$  (1.702 m)   Wt 182 lb (82.6 kg)   BMI 28.51 kg/m  Physical Exam Gen: NAD, alert, cooperative with exam, well-appearing MSK:  Neurovascularly intact    Limited ultrasound: Left wrist:  There is hyperemia as well as anechoic change of the distal radius to represent a nondisplaced distal radius fracture. There is joint effusion within the carpal joints. No changes of the distal ulna  Summary: Findings consistent with distal radius fracture  Ultrasound and interpretation by Clearance Coots, MD  1. Wrist/hand 2. left 3. Volar splint 4. Ortho-glass 5. Applied by Dr. Raeford Razor     ASSESSMENT & PLAN:   Closed fracture of left distal radius Acutely occurring.  Initial injury on 8/8.  Ultrasound was demonstrating fracture.  Independent review of the x-ray from today shows no obvious fractures. -Counseled on home exercise therapy and supportive care. -Volar splint. -Follow up to transition to Velcro brace.

## 2022-05-24 NOTE — Assessment & Plan Note (Signed)
Acutely occurring.  Initial injury on 8/8.  Ultrasound was demonstrating fracture.  Independent review of the x-ray from today shows no obvious fractures. -Counseled on home exercise therapy and supportive care. -Volar splint. -Follow up to transition to Velcro brace.

## 2022-05-31 ENCOUNTER — Ambulatory Visit (INDEPENDENT_AMBULATORY_CARE_PROVIDER_SITE_OTHER): Payer: Medicare HMO | Admitting: Sports Medicine

## 2022-05-31 DIAGNOSIS — M5416 Radiculopathy, lumbar region: Secondary | ICD-10-CM

## 2022-05-31 DIAGNOSIS — M19032 Primary osteoarthritis, left wrist: Secondary | ICD-10-CM | POA: Diagnosis not present

## 2022-05-31 NOTE — Assessment & Plan Note (Signed)
This pleasant 71 year old female returns, she has a history of rheumatoid arthritis currently on leflunomide, predominantly right L5 radiculitis with multilevel DDD. She ended up recently with a right L4-L5 interlaminar epidural and returns today 95% better. Continue gabapentin, return as needed for this.

## 2022-05-31 NOTE — Progress Notes (Signed)
    Procedures performed today:    None.  Independent interpretation of notes and tests performed by another provider:   I did personally review the left hand and wrist x-rays, I do not see any evidence of fracture.  She does have significant CMC, STT, and radiocarpal degenerative changes.  Brief History, Exam, Impression, and Recommendations:    Right lumbar radiculitis This pleasant 71 year old female returns, she has a history of rheumatoid arthritis currently on leflunomide, predominantly right L5 radiculitis with multilevel DDD. She ended up recently with a right L4-L5 interlaminar epidural and returns today 95% better. Continue gabapentin, return as needed for this.  Primary osteoarthritis, left wrist Margie also has significant CMC, STT and radiocarpal osteoarthritis, she had a fall, she had 2 x-rays that were negative for fracture, she had an ultrasound that did show some edema suspicious for a injury to the bone. She was placed in a splint and follows up with me for further advice. She returns today doing a lot better, good motion, good strength, really no tenderness over the radius. I do think her residual pain is related to her underlying degenerative processes which we can inject in the future. I will give her some CMC arthritis conditioning exercises and she can return to see me as needed for this.    ____________________________________________ Gwen Her. Dianah Field, M.D., ABFM., CAQSM., AME. Primary Care and Sports Medicine [facility]  Adjunct Professor of [place] [employer]  Risk manager

## 2022-05-31 NOTE — Assessment & Plan Note (Signed)
Melody Moore also has significant CMC, STT and radiocarpal osteoarthritis, she had a fall, she had 2 x-rays that were negative for fracture, she had an ultrasound that did show some edema suspicious for a injury to the bone. She was placed in a splint and follows up with me for further advice. She returns today doing a lot better, good motion, good strength, really no tenderness over the radius. I do think her residual pain is related to her underlying degenerative processes which we can inject in the future. I will give her some CMC arthritis conditioning exercises and she can return to see me as needed for this.

## 2022-06-03 ENCOUNTER — Ambulatory Visit: Payer: Medicare HMO | Admitting: Family Medicine

## 2022-06-12 ENCOUNTER — Other Ambulatory Visit: Payer: Self-pay | Admitting: Family Medicine

## 2022-06-12 DIAGNOSIS — I6523 Occlusion and stenosis of bilateral carotid arteries: Secondary | ICD-10-CM

## 2022-06-28 ENCOUNTER — Ambulatory Visit (INDEPENDENT_AMBULATORY_CARE_PROVIDER_SITE_OTHER): Payer: Medicare HMO | Admitting: Family Medicine

## 2022-06-28 DIAGNOSIS — Z0000 Encounter for general adult medical examination without abnormal findings: Secondary | ICD-10-CM

## 2022-06-28 DIAGNOSIS — Z1231 Encounter for screening mammogram for malignant neoplasm of breast: Secondary | ICD-10-CM

## 2022-06-28 NOTE — Progress Notes (Signed)
MEDICARE ANNUAL WELLNESS VISIT  06/28/2022  Telephone Visit Disclaimer This Medicare AWV was conducted by telephone due to national recommendations for restrictions regarding the COVID-19 Pandemic (e.g. social distancing).  I verified, using two identifiers, that I am speaking with Melody Moore or their authorized healthcare agent. I discussed the limitations, risks, security, and privacy concerns of performing an evaluation and management service by telephone and the potential availability of an in-person appointment in the future. The patient expressed understanding and agreed to proceed.  Location of Patient: Home Location of Provider (nurse): In the office.  Subjective:    Melody Moore is a 71 y.o. female patient of Luetta Nutting, DO who had a Medicare Annual Wellness Visit today via telephone. Melody Moore is Retired and lives with their spouse. she does not have any children. she reports that she is socially active and does interact with friends/family regularly. she is moderately physically active and enjoys walking.  Patient Care Team: Luetta Nutting, DO as PCP - General (Family Medicine) Rosita Kea, PA-C (Inactive) as Consulting Physician (Rheumatology)     06/28/2022    8:15 AM 03/18/2022    9:37 AM 06/27/2021    8:52 AM 03/24/2020   10:02 AM 12/19/2018    8:23 AM 06/09/2018    9:40 AM 01/09/2018   10:20 AM  Advanced Directives  Does Patient Have a Medical Advance Directive? Yes Yes Yes Yes Yes Yes Yes  Type of Advance Directive Living will [place];Living will [place];Living will Healthcare Power of [place];Living will [place];Living will [place];Living will  Does patient want to make changes to medical advance directive? No - Patient declined No - Patient declined No - Patient declined Yes (MAU/Ambulatory/Procedural Areas - Information given) No - Patient  declined    Copy of [place] in Chart?  Yes - validated most recent copy scanned in chart (See row information) Yes - validated most recent copy scanned in chart (See row information) Yes - validated most recent copy scanned in chart (See row information) Yes - validated most recent copy scanned in chart (See row information) Yes Yes    Hospital Utilization Over the Past 12 Months: # of hospitalizations or ER visits: 0 # of surgeries: 0  Review of Systems    Patient reports that her overall health is better compared to last year.  History obtained from chart review and the patient  Patient Reported Readings (BP, Pulse, CBG, Weight, etc) none  Pain Assessment Pain : No/denies pain     Current Medications & Allergies (verified) Allergies as of 06/28/2022       Reactions   Bee Venom Anaphylaxis   Methotrexate Derivatives Other (See Comments)   Acute kidney injury   Oxycodone Other (See Comments)   Adrenal crisis while taking both prednisone and oxycodone    Prednisone    Adrenal crisis while taking both prednisone and oxycodone    Verapamil Hives   Latex Rash   Nickel Rash   Nitrofuran Derivatives Rash   Sulfasalazine Diarrhea, Nausea And Vomiting        Medication List        Accurate as of June 28, 2022  8:26 AM. If you have any questions, ask your nurse or doctor.          acetaminophen 650 MG CR tablet Commonly known as: TYLENOL Take 2 tablets (1,300 mg total) by mouth 2 (two) times  daily as needed for pain.   amLODipine 10 MG tablet Commonly known as: NORVASC TAKE 1 TABLET BY MOUTH EVERY DAY   aspirin EC 81 MG tablet Take 81 mg by mouth daily.   atorvastatin 40 MG tablet Commonly known as: LIPITOR TAKE 1 TABLET BY MOUTH EVERY DAY   CALCIUM 600 + D PO Take 2 tablets by mouth daily.   cyclobenzaprine 10 MG tablet Commonly known as: FLEXERIL One half to one tab PO qHS, then increase gradually to one tab TID.   docusate  sodium 100 MG capsule Commonly known as: COLACE Take 100 mg by mouth daily.   EPINEPHrine 0.3 mg/0.3 mL Soaj injection Commonly known as: EPI-PEN INJECT 0.3 MLS (0.3 MG TOTAL) INTO THE MUSCLE ONCE AS NEEDED FOR UP TO 1 DOSE.   ferrous sulfate 325 (65 FE) MG tablet Take 325 mg by mouth daily with breakfast.   fluticasone 50 MCG/ACT nasal spray Commonly known as: FLONASE SPRAY 2 SPRAYS INTO EACH NOSTRIL EVERY DAY What changed: See the new instructions.   gabapentin 300 MG capsule Commonly known as: NEURONTIN TAKE 1 CAPSULE BY MOUTH EVERYDAY AT BEDTIME   hydroxychloroquine 200 MG tablet Commonly known as: PLAQUENIL Take 200 mg by mouth 2 (two) times daily.   leflunomide 20 MG tablet Commonly known as: ARAVA Take 1 tablet by mouth daily.   leucovorin 5 MG tablet Commonly known as: WELLCOVORIN Take 5 mg by mouth daily.   levothyroxine 50 MCG tablet Commonly known as: SYNTHROID TAKE 1 TABLET BY MOUTH EVERY DAY BEFORE BREAKFAST   lisinopril 20 MG tablet Commonly known as: ZESTRIL Take 1 tablet (20 mg total) by mouth daily.   traMADol 50 MG tablet Commonly known as: ULTRAM Take 1 tablet (50 mg total) by mouth daily as needed.   traZODone 50 MG tablet Commonly known as: DESYREL TAKE 1 TO 2 TABLETS BY MOUTH AT BEDTIME AS NEEDED   triamcinolone ointment 0.5 % Commonly known as: KENALOG Apply 1 application topically daily as needed.   triazolam 0.25 MG tablet Commonly known as: HALCION 1-2 tabs PO 2 hours before procedure or imaging.  Do not drive with this medication.   Turmeric 500 MG Caps Take by mouth daily.   Vitamin D 50 MCG (2000 UT) tablet Take 2,000 Units by mouth daily.        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[place]) 02/12/2016   AKI (acute kidney injury) ([place]) 02/2016   Allergy    Arthritis    Carotid stenosis, asymptomatic, bilateral 05/26/2017   Dopplers 02/12/2016: right - mid-distal 40-59% stenosis;  left - 40-59% stenosis   Cataract    CKD (chronic kidney disease) stage 3, GFR 30-59 ml/min (HCC) 11/02/2017   Decreased calculated GFR 06/15/2017   Gallstones 06/22/2017   History of echocardiogram 11/07/2017   04/09/2016: EF 41%, mild diastolic dysfunction, trivial MR and TR   Hypertension    Hypothyroidism    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   Rheumatoid arthritis ([place])    Rheumatoid arthritis involving multiple sites ([place]) 05/26/2017   Transaminitis 06/15/2017   Normal abdominal US 06/22/17   Past Surgical History:  Procedure Laterality Date   APPENDECTOMY     CATARACT EXTRACTION, BILATERAL  2015   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   TOTAL KNEE ARTHROPLASTY Right 12/16/2017   Procedure: RIGHT TOTAL KNEE ARTHROPLASTY;  Surgeon: Dorna Leitz, MD;  Location: [facility];  Service: Orthopedics;  Laterality: Right;  Adductor Block  Family History  Problem Relation Age of Onset   Diabetes Mother    Lung cancer Father    Hypertension Father    Uterine cancer Sister    Social History   Socioeconomic History   Marital status: Married    Spouse name: Fulton Mole   Number of children: 0   Years of education: 16   Highest education level: Bachelor's degree (e.g., BA, AB, BS)  Occupational History   Occupation: Media planner [employer]    Comment: retired  Tobacco Use   Smoking status: Former    Types: Cigarettes    Quit date: 02/07/1974    Years since quitting: 48.4   Smokeless tobacco: Never  Vaping Use   Vaping Use: Never used  Substance and Sexual Activity   Alcohol use: Yes    Comment: occasionally   Drug use: No   Sexual activity: Yes    Partners: Male    Birth control/protection: Post-menopausal  Other Topics Concern   Not on file  Social History Narrative   06/27/2021 - recently married in 2020 - originally from [place] with her husband. She enjoys walking and making wreaths.   Social Determinants of Health   Financial  Resource Strain: Low Risk  (06/28/2022)   Overall Financial Resource Strain (CARDIA)    Difficulty of Paying Living Expenses: Not hard at all  Food Insecurity: No Food Insecurity (06/28/2022)   Hunger Vital Sign    Worried About Running Out of Food in the Last Year: Never true    Ran Out of Food in the Last Year: Never true  Transportation Needs: No Transportation Needs (06/27/2021)   PRAPARE - Hydrologist (Medical): No    Lack of Transportation (Non-Medical): No  Physical Activity: Sufficiently Active (06/28/2022)   Exercise Vital Sign    Days of Exercise per Week: 7 days    Minutes of Exercise per Session: 50 min  Stress: No Stress Concern Present (06/28/2022)   [place]    Feeling of Stress : Not at all  Social Connections: [place] (06/28/2022)   Social Connection and Isolation Panel [NHANES]    Frequency of Communication with Friends and Family: More than three times a week    Frequency of Social Gatherings with Friends and Family: Three times a week    Attends Religious Services: More than 4 times per year    Active Member of Clubs or Organizations: Yes    Attends Archivist Meetings: More than 4 times per year    Marital Status: Married    Activities of Daily Living    06/28/2022    8:17 AM  In your present state of health, do you have any difficulty performing the following activities:  Hearing? 0  Vision? 0  Difficulty concentrating or making decisions? 0  Walking or climbing stairs? 0  Dressing or bathing? 0  Doing errands, shopping? 0  Preparing Food and eating ? N  Using the Toilet? N  In the past six months, have you accidently leaked urine? N  Do you have problems with loss of bowel control? N  Managing your Medications? N  Managing your Finances? N  Housekeeping or managing your Housekeeping? N    Patient Education/ Literacy How often do you  need to have someone help you when you read instructions, pamphlets, or other written materials from your doctor or pharmacy?: 1 -  Never What is the last grade level you completed in school?: Associates degree  Exercise Current Exercise Habits: Home exercise routine, Type of exercise: walking, Time (Minutes): 50, Frequency (Times/Week): 7, Weekly Exercise (Minutes/Week): 350, Intensity: Moderate, Exercise limited by: None identified  Diet Patient reports consuming  2-3  meals a day and 0 snack(s) a day Patient reports that her primary diet is: Regular Patient reports that she does have regular access to food.   Depression Screen    06/28/2022    8:16 AM 06/27/2021    8:54 AM 06/27/2021    8:49 AM 12/31/2020    8:15 AM 03/24/2020   10:03 AM 06/22/2019    8:20 AM 12/19/2018    8:27 AM  PHQ 2/9 Scores  PHQ - 2 Score 0 0 0 0 0 1 0  PHQ- 9 Score     0 4      Fall Risk    06/28/2022    8:13 AM 06/27/2021    8:54 AM 12/31/2020    8:15 AM 03/24/2020   10:02 AM 01/11/2019    3:36 PM  Fall Risk   Falls in the past year? 1 0 0 1   Number falls in past yr: 0 0 0 0   Injury with Fall? 1  0 0   Risk for fall due to : History of fall(s)  No Fall Risks    Risk for fall due to: Comment  no barriers     Follow up Falls evaluation completed;Education provided;Falls prevention discussed Falls prevention discussed Falls evaluation completed Falls evaluation completed Education provided     Objective:  Melody Moore seemed alert and oriented and she participated appropriately during our telephone visit.  Blood Pressure Weight BMI  BP Readings from Last 3 Encounters:  05/24/22 (!) 160/82  05/11/22 138/80  04/30/22 (!) 175/74   Wt Readings from Last 3 Encounters:  05/24/22 182 lb (82.6 kg)  12/31/21 189 lb 1.9 oz (85.8 kg)  09/17/21 191 lb (86.6 kg)   BMI Readings from Last 1 Encounters:  05/24/22 28.51 kg/m    *Unable to obtain current vital signs, weight, and BMI due to telephone visit  type  Hearing/Vision  Melody Moore did not seem to have difficulty with hearing/understanding during the telephone conversation Reports that she has had a formal eye exam by an eye care professional within the past year Reports that she has not had a formal hearing evaluation within the past year *Unable to fully assess hearing and vision during telephone visit type  Cognitive Function:    06/28/2022    8:19 AM 06/27/2021    8:55 AM 03/24/2020   10:05 AM 12/19/2018    8:31 AM  6CIT Screen  What Year? 0 points 0 points 0 points 0 points  What month? 0 points 0 points 0 points 0 points  What time? 0 points 0 points 0 points 0 points  Count back from 20 0 points 0 points 0 points 2 points  Months in reverse 0 points  0 points 0 points  Repeat phrase 0 points  0 points 0 points  Total Score 0 points  0 points 2 points   (Normal:0-7, Significant for Dysfunction: >8)  Normal Cognitive Function Screening: Yes   Immunization & Health Maintenance Record Immunization History  Administered Date(s) Administered   Fluad Quad(high Dose 65+) 07/03/2020, 09/07/2021   Influenza, High Dose Seasonal PF 05/26/2017   PFIZER(Purple Top)SARS-COV-2 Vaccination 12/03/2019, 01/01/2020, 07/11/2020, 04/02/2021   Tdap 03/24/2017   Zoster  Recombinat (Shingrix) 04/02/2021    Health Maintenance  Topic Date Due   COVID-19 Vaccine (5 - Pfizer risk series) 07/14/2022 (Originally 05/28/2021)   Zoster Vaccines- Shingrix (2 of 2) 09/27/2022 (Originally 05/28/2021)   INFLUENZA VACCINE  01/02/2023 (Originally 05/04/2022)   Pneumonia Vaccine 4+ Years old (1 - PCV) 06/29/2023 (Originally 02/07/2014)   MAMMOGRAM  04/23/2023   TETANUS/TDAP  03/25/2027   COLONOSCOPY (Pts 45-83yr Insurance coverage will need to be confirmed)  04/04/2027   DEXA SCAN  Completed   Hepatitis C Screening  Completed   HPV VACCINES  Aged Out       Assessment  This is a routine wellness examination for [facility]  Health Maintenance:  Due or Overdue There are no preventive care reminders to display for this patient.   Melody Toutantdoes not need a referral for Community Assistance: Care Management:   no Social Work:    no Prescription Assistance:  no Nutrition/Diabetes Education:  no   Plan:  Personalized Goals  Goals Addressed               This Visit's Progress     Patient Stated (pt-stated)        Patient states that she would like to maintain the new active healthy eating lifestyle.       Personalized Health Maintenance & Screening Recommendations  Pneumococcal vaccine  Influenza vaccine Screening mammography Shingrix vaccine  Lung Cancer Screening Recommended: no (Low Dose CT Chest recommended if Age 73-80years, 30 pack-year currently smoking OR have quit w/in past 15 years) Hepatitis C Screening recommended: no HIV Screening recommended: no  Advanced Directives: Written information was not prepared per patient's request.  Referrals & Orders Orders Placed This Encounter  Procedures   Mammogram 3D SCREEN BREAST BILATERAL    Follow-up Plan Follow-up with MLuetta Nutting DO as planned Schedule your mammogram.  Please bring the immunization record from your pharmacy. Medicare wellness visit in one year.  Patient will access AVS on my chart.   I have personally reviewed and noted the following in the patient's chart:   Medical and social history Use of alcohol, tobacco or illicit drugs  Current medications and supplements Functional ability and status Nutritional status Physical activity Advanced directives List of other physicians Hospitalizations, surgeries, and ER visits in previous 12 months Vitals Screenings to include cognitive, depression, and falls Referrals and appointments  In addition, I have reviewed and discussed with MRoslyn Smilingcertain preventive protocols, quality metrics, and best practice recommendations. A written personalized care plan for preventive  services as well as general preventive health recommendations is available and can be mailed to the patient at her request.      BTinnie Gens RN BSN  06/28/2022

## 2022-06-28 NOTE — Patient Instructions (Addendum)
[place] Maintenance Summary and Written Plan of Care  [name] ,  Thank you for allowing me to perform your Medicare Annual Wellness Visit and for your ongoing commitment to your health.   Health Maintenance & Immunization History Health Maintenance  Topic Date Due  . COVID-19 Vaccine (5 - Pfizer risk series) 07/14/2022 (Originally 05/28/2021)  . Zoster Vaccines- Shingrix (2 of 2) 09/27/2022 (Originally 05/28/2021)  . INFLUENZA VACCINE  01/02/2023 (Originally 05/04/2022)  . Pneumonia Vaccine 94+ Years old (1 - PCV) 06/29/2023 (Originally 02/07/2014)  . MAMMOGRAM  04/23/2023  . TETANUS/TDAP  03/25/2027  . COLONOSCOPY (Pts 45-15yr Insurance coverage will need to be confirmed)  04/04/2027  . DEXA SCAN  Completed  . Hepatitis C Screening  Completed  . HPV VACCINES  Aged Out   Immunization History  Administered Date(s) Administered  . Fluad Quad(high Dose 65+) 07/03/2020, 09/07/2021  . Influenza, High Dose Seasonal PF 05/26/2017  . PFIZER(Purple Top)SARS-COV-2 Vaccination 12/03/2019, 01/01/2020, 07/11/2020, 04/02/2021  . Tdap 03/24/2017  . Zoster Recombinat (Shingrix) 04/02/2021    These are the patient goals that we discussed:  Goals Addressed              This Visit's Progress   .  Patient Stated (pt-stated)        Patient states that she would like to maintain the new active healthy eating lifestyle.        This is a list of Health Maintenance Items that are overdue or due now: Pneumococcal vaccine  Influenza vaccine Screening mammography Shingrix vaccine  Orders/Referrals Placed Today: Orders Placed This Encounter  Procedures  . Mammogram 3D SCREEN BREAST BILATERAL    Standing Status:   Future    Standing Expiration Date:   06/29/2023    Order Specific Question:   Reason for Exam (SYMPTOM  OR DIAGNOSIS REQUIRED)    Answer:   breast cancer screening    Order Specific Question:   Preferred imaging location?    [facility]   (Contact our referral department at [phone] you have not spoken with someone about your referral appointment within the next 5 days)    Follow-up Plan Follow-up with MLuetta Nutting DO as planned Schedule your mammogram.  Please bring the immunization record from your pharmacy. Medicare wellness visit in one year.  Patient will access AVS on my chart.      Health Maintenance, Female Adopting a healthy lifestyle and getting preventive care are important in promoting health and wellness. Ask your health care provider about: The right schedule for you to have regular tests and exams. Things you can do on your own to prevent diseases and keep yourself healthy. What should I know about diet, weight, and exercise? Eat a healthy diet  Eat a diet that includes plenty of vegetables, fruits, low-fat dairy products, and lean protein. Do not eat a lot of foods that are high in solid fats, added sugars, or sodium. Maintain a healthy weight Body mass index (BMI) is used to identify weight problems. It estimates body fat based on height and weight. Your health care provider can help determine your BMI and help you achieve or maintain a healthy weight. Get regular exercise Get regular exercise. This is one of the most important things you can do for your health. Most adults should: Exercise for at least 150 minutes each week. The exercise should increase your heart rate and make you sweat (moderate-intensity exercise). Do strengthening exercises at  least twice a week. This is in addition to the moderate-intensity exercise. Spend less time sitting. Even light physical activity can be beneficial. Watch cholesterol and blood lipids Have your blood tested for lipids and cholesterol at 73 years of age, then have this test every 5 years. Have your cholesterol levels checked more often if: Your lipid or cholesterol levels are high. You are older than 73 years of age. You are at  high risk for heart disease. What should I know about cancer screening? Depending on your health history and family history, you may need to have cancer screening at various ages. This may include screening for: Breast cancer. Cervical cancer. Colorectal cancer. Skin cancer. Lung cancer. What should I know about heart disease, diabetes, and high blood pressure? Blood pressure and heart disease High blood pressure causes heart disease and increases the risk of stroke. This is more likely to develop in people who have high blood pressure readings or are overweight. Have your blood pressure checked: Every 3-5 years if you are 14-44 years of age. Every year if you are 47 years old or older. Diabetes Have regular diabetes screenings. This checks your fasting blood sugar level. Have the screening done: Once every three years after age 57 if you are at a normal weight and have a low risk for diabetes. More often and at a younger age if you are overweight or have a high risk for diabetes. What should I know about preventing infection? Hepatitis B If you have a higher risk for hepatitis B, you should be screened for this virus. Talk with your health care provider to find out if you are at risk for hepatitis B infection. Hepatitis C Testing is recommended for: Everyone born from [birth year] through 1967. Anyone with known risk factors for hepatitis C. Sexually transmitted infections (STIs) Get screened for STIs, including gonorrhea and chlamydia, if: You are sexually active and are younger than 73 years of age. You are older than 73 years of age and your health care provider tells you that you are at risk for this type of infection. Your sexual activity has changed since you were last screened, and you are at increased risk for chlamydia or gonorrhea. Ask your health care provider if you are at risk. Ask your health care provider about whether you are at high risk for HIV. Your health care provider may  recommend a prescription medicine to help prevent HIV infection. If you choose to take medicine to prevent HIV, you should first get tested for HIV. You should then be tested every 3 months for as long as you are taking the medicine. Pregnancy If you are about to stop having your period (premenopausal) and you may become pregnant, seek counseling before you get pregnant. Take 400 to 800 micrograms (mcg) of folic acid every day if you become pregnant. Ask for birth control (contraception) if you want to prevent pregnancy. Osteoporosis and menopause Osteoporosis is a disease in which the bones lose minerals and strength with aging. This can result in bone fractures. If you are 28 years old or older, or if you are at risk for osteoporosis and fractures, ask your health care provider if you should: Be screened for bone loss. Take a calcium or vitamin D supplement to lower your risk of fractures. Be given hormone replacement therapy (HRT) to treat symptoms of menopause. Follow these instructions at home: Alcohol use Do not drink alcohol if: Your health care provider tells you not to drink. You are  pregnant, may be pregnant, or are planning to become pregnant. If you drink alcohol: Limit how much you have to: 0-1 drink a day. Know how much alcohol is in your drink. In the [place] one drink equals one 12 oz bottle of beer (355 mL), one 5 oz glass of wine (148 mL), or one 1 oz glass of hard liquor (44 mL). Lifestyle Do not use any products that contain nicotine or tobacco. These products include cigarettes, chewing tobacco, and vaping devices, such as e-cigarettes. If you need help quitting, ask your health care provider. Do not use street drugs. Do not share needles. Ask your health care provider for help if you need support or information about quitting drugs. General instructions Schedule regular health, dental, and eye exams. Stay current with your vaccines. Tell your health care provider  if: You often feel depressed. You have ever been abused or do not feel safe at home. Summary Adopting a healthy lifestyle and getting preventive care are important in promoting health and wellness. Follow your health care provider's instructions about healthy diet, exercising, and getting tested or screened for diseases. Follow your health care provider's instructions on monitoring your cholesterol and blood pressure. This information is not intended to replace advice given to you by your health care provider. Make sure you discuss any questions you have with your health care provider. Document Revised: 02/09/2021 Document Reviewed: 02/09/2021 Elsevier Patient Education  [place].

## 2022-07-02 DIAGNOSIS — M353 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: Secondary | ICD-10-CM | POA: Diagnosis not present

## 2022-07-02 DIAGNOSIS — M069 Rheumatoid arthritis, unspecified: Secondary | ICD-10-CM | POA: Diagnosis not present

## 2022-07-02 DIAGNOSIS — Z6829 Body mass index (BMI) 29.0-29.9, adult: Secondary | ICD-10-CM | POA: Diagnosis not present

## 2022-07-02 DIAGNOSIS — B0223 Postherpetic polyneuropathy: Secondary | ICD-10-CM | POA: Diagnosis not present

## 2022-07-02 DIAGNOSIS — M8589 Other specified disorders of bone density and structure, multiple sites: Secondary | ICD-10-CM | POA: Diagnosis not present

## 2022-07-02 DIAGNOSIS — M1991 Primary osteoarthritis, unspecified site: Secondary | ICD-10-CM | POA: Diagnosis not present

## 2022-07-02 DIAGNOSIS — N1831 Chronic kidney disease, stage 3a: Secondary | ICD-10-CM | POA: Diagnosis not present

## 2022-07-02 DIAGNOSIS — M48061 Spinal stenosis, lumbar region without neurogenic claudication: Secondary | ICD-10-CM | POA: Diagnosis not present

## 2022-07-02 DIAGNOSIS — B0230 Zoster ocular disease, unspecified: Secondary | ICD-10-CM | POA: Diagnosis not present

## 2022-07-06 ENCOUNTER — Encounter: Payer: Self-pay | Admitting: Family Medicine

## 2022-07-06 ENCOUNTER — Ambulatory Visit (INDEPENDENT_AMBULATORY_CARE_PROVIDER_SITE_OTHER): Payer: Medicare HMO | Admitting: Family Medicine

## 2022-07-06 VITALS — BP 136/82 | HR 80 | Ht 67.0 in | Wt 181.0 lb

## 2022-07-06 DIAGNOSIS — M069 Rheumatoid arthritis, unspecified: Secondary | ICD-10-CM

## 2022-07-06 DIAGNOSIS — Z23 Encounter for immunization: Secondary | ICD-10-CM

## 2022-07-06 DIAGNOSIS — M19032 Primary osteoarthritis, left wrist: Secondary | ICD-10-CM

## 2022-07-06 DIAGNOSIS — I10 Essential (primary) hypertension: Secondary | ICD-10-CM

## 2022-07-06 DIAGNOSIS — N183 Chronic kidney disease, stage 3 unspecified: Secondary | ICD-10-CM | POA: Diagnosis not present

## 2022-07-06 DIAGNOSIS — E039 Hypothyroidism, unspecified: Secondary | ICD-10-CM

## 2022-07-06 DIAGNOSIS — E785 Hyperlipidemia, unspecified: Secondary | ICD-10-CM

## 2022-07-06 DIAGNOSIS — I129 Hypertensive chronic kidney disease with stage 1 through stage 4 chronic kidney disease, or unspecified chronic kidney disease: Secondary | ICD-10-CM

## 2022-07-06 DIAGNOSIS — N1831 Chronic kidney disease, stage 3a: Secondary | ICD-10-CM

## 2022-07-06 NOTE — Assessment & Plan Note (Addendum)
BP is well controlled at this time.  Recommend continuation of current medications.  Updating labs today.  Follow up in 6 months.

## 2022-07-06 NOTE — Assessment & Plan Note (Signed)
Updating lipid panel. Tolerating atorvastatin well

## 2022-07-06 NOTE — Assessment & Plan Note (Signed)
Update renal function.  

## 2022-07-06 NOTE — Assessment & Plan Note (Signed)
She will continue to see rheumatology. Would like to see rheumatology in [place] due to difficulty driving to [place]. Orders entered.

## 2022-07-06 NOTE — Assessment & Plan Note (Signed)
Thumb spica wrist dispensed.

## 2022-07-06 NOTE — Assessment & Plan Note (Signed)
Update TSH today. 

## 2022-07-06 NOTE — Progress Notes (Signed)
Melody Moore - 71 y.o. female MRN [id number]  Date of birth: 1950-10-18  Subjective Chief Complaint  Patient presents with  . Hand Injury    HPI Melody Moore is a 71 y.o. female here today for follow up visit.    She reports that she is dong pretty well.  She had a fall recently.  Followed up with Dr. Dianah Field.  Has splint but would prefer a short thumb spica.   Continues on combination of amlodipine and lisinopril for management of HTN.  She is tolerating medications well.  Denies side effects at this time.  She has not had episodes of chest pain, shortness of breath, palpitations, headache or vision changes.  Feels pretty good with current strength   Tolerating atorvastatin well for HLD.   Continues to see rheumatology for management of RA.  Stable with plaquenil, leflunomide and leucovoroin.    ROS:  A comprehensive ROS was completed and negative except as noted per HPI  Allergies  Allergen Reactions  . Bee Venom Anaphylaxis  . Methotrexate Derivatives Other (See Comments)    Acute kidney injury  . Oxycodone Other (See Comments)    Adrenal crisis while taking both prednisone and oxycodone   . Prednisone     Adrenal crisis while taking both prednisone and oxycodone   . Verapamil Hives  . Latex Rash  . Nickel Rash  . Nitrofuran Derivatives Rash  . Sulfasalazine Diarrhea and Nausea And Vomiting    Past Medical History:  Diagnosis Date  . 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 AKI (acute kidney injury) ([place]) 02/2016  . Allergy   . Arthritis   . Carotid stenosis, asymptomatic, bilateral 05/26/2017   Dopplers 02/12/2016: right - mid-distal 40-59% stenosis; left - 40-59% stenosis  . Cataract   . CKD (chronic kidney disease) stage 3, GFR 30-59 ml/min (HCC) 11/02/2017  . Decreased calculated GFR 06/15/2017  . Gallstones 06/22/2017  . History of echocardiogram 11/07/2017   04/09/2016: EF 71%, mild diastolic dysfunction, trivial MR and TR  . Hypertension   . Hypothyroidism    . 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 Rheumatoid arthritis ([place])   . Rheumatoid arthritis involving multiple sites ([place]) 05/26/2017  . Transaminitis 06/15/2017   Normal abdominal US 06/22/17    Past Surgical History:  Procedure Laterality Date  . APPENDECTOMY    . CATARACT EXTRACTION, BILATERAL  2015  . 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  . TOTAL KNEE ARTHROPLASTY Right 12/16/2017   Procedure: RIGHT TOTAL KNEE ARTHROPLASTY;  Surgeon: Dorna Leitz, MD;  Location: [facility];  Service: Orthopedics;  Laterality: Right;  Adductor Block    Social History   Socioeconomic History  . Marital status: Married    Spouse name: Fulton Mole  . Number of children: 0  . Years of education: 16  . Highest education level: Bachelor's degree (e.g., BA, AB, BS)  Occupational History  . Occupation: [employer]    Comment: retired  Tobacco Use  . Smoking status: Former    Types: Cigarettes    Quit date: 02/07/1974    Years since quitting: 48.4  . Smokeless tobacco: Never  Vaping Use  . Vaping Use: Never used  Substance and Sexual Activity  . Alcohol use: Yes    Comment: occasionally  . Drug use: No  . Sexual activity: Yes    Partners: Male    Birth control/protection: Post-menopausal  Other Topics Concern  . Not on file  Social History Narrative   06/27/2021 -  recently married in 2020 - originally from [place] with her husband. She enjoys walking and making wreaths.   Social Determinants of Health   Financial Resource Strain: Low Risk  (06/28/2022)   Overall Financial Resource Strain (CARDIA)   . Difficulty of Paying Living Expenses: Not hard at all  Food Insecurity: No Food Insecurity (06/28/2022)   Hunger Vital Sign   . Worried About Charity fundraiser in the Last Year: Never true   . Ran Out of Food in the Last Year: Never true  Transportation Needs: No Transportation Needs (06/27/2021)   PRAPARE - Transportation   . Lack of  Transportation (Medical): No   . Lack of Transportation (Non-Medical): No  Physical Activity: Sufficiently Active (06/28/2022)   Exercise Vital Sign   . Days of Exercise per Week: 7 days   . Minutes of Exercise per Session: 50 min  Stress: No Stress Concern Present (06/28/2022)   [place]   . Feeling of Stress : Not at all  Social Connections: Socially Integrated (06/28/2022)   Social Connection and Isolation Panel [NHANES]   . Frequency of Communication with Friends and Family: More than three times a week   . Frequency of Social Gatherings with Friends and Family: Three times a week   . Attends Religious Services: More than 4 times per year   . Active Member of Clubs or Organizations: Yes   . Attends Archivist Meetings: More than 4 times per year   . Marital Status: Married    Family History  Problem Relation Age of Onset  . Diabetes Mother   . Lung cancer Father   . Hypertension Father   . Uterine cancer Sister     Health Maintenance  Topic Date Due  . COVID-19 Vaccine (5 - Pfizer risk series) 07/14/2022 (Originally 05/28/2021)  . Zoster Vaccines- Shingrix (2 of 2) 09/27/2022 (Originally 05/28/2021)  . INFLUENZA VACCINE  01/02/2023 (Originally 05/04/2022)  . Pneumonia Vaccine 80+ Years old (1 - PCV) 06/29/2023 (Originally 02/07/2014)  . MAMMOGRAM  04/23/2023  . TETANUS/TDAP  03/25/2027  . COLONOSCOPY (Pts 45-67yr Insurance coverage will need to be confirmed)  04/04/2027  . DEXA SCAN  Completed  . Hepatitis C Screening  Completed  . HPV VACCINES  Aged Out     ----------------------------------------------------------------------------------------------------------------------------------------------------------------------------------------------------------------- Physical Exam BP 136/82 (BP Location: Left Arm, Patient Position: Sitting, Cuff Size: Large)   Pulse 80   Ht '5\' 7"'$  (1.702 m)   Wt 181  lb (82.1 kg)   SpO2 98%   BMI 28.35 kg/m   Physical Exam Constitutional:      Appearance: Normal appearance.  Eyes:     General: No scleral icterus. Cardiovascular:     Rate and Rhythm: Normal rate and regular rhythm.  Pulmonary:     Effort: Pulmonary effort is normal.     Breath sounds: Normal breath sounds.  Musculoskeletal:     Cervical back: Neck supple.     Comments: Pain with ulnar deviation of the wrist.   Neurological:     Mental Status: She is alert.  Psychiatric:        Mood and Affect: Mood normal.        Behavior: Behavior normal.    ------------------------------------------------------------------------------------------------------------------------------------------------------------------------------------------------------------------- Assessment and Plan  Benign hypertension with CKD (chronic kidney disease) stage III (HCC) BP is well controlled at this time.  Recommend continuation of current medications.  Updating labs today.  Follow up in 6 months.   Acquired hypothyroidism Update TSH today.   Chronic kidney disease, stage 3a ([place]) Update renal function.   Dyslipidemia Updating lipid panel. Tolerating atorvastatin well   Rheumatoid arthritis involving multiple sites [facility]) She will continue to see rheumatology. Would like to see rheumatology in [place] due to difficulty driving to [place]. Orders entered.   Primary osteoarthritis, left wrist Thumb spica wrist dispensed.    No orders of the defined types were placed in this encounter.   Return in about 6 months (around 01/05/2023) for HTN.    This visit occurred during the SARS-CoV-2 public health emergency.  Safety protocols were in place, including screening questions prior to the visit, additional usage of staff PPE, and extensive cleaning of exam room while observing appropriate contact time as indicated for disinfecting solutions.

## 2022-07-07 LAB — CBC WITH DIFFERENTIAL/PLATELET
Absolute Monocytes: 504 cells/uL (ref 200–950)
Basophils Absolute: 50 cells/uL (ref 0–200)
Basophils Relative: 1.1 %
Eosinophils Absolute: 0 cells/uL — ABNORMAL LOW (ref 15–500)
Eosinophils Relative: 0 %
HCT: 38.5 % (ref 35.0–45.0)
Hemoglobin: 12.8 g/dL (ref 11.7–15.5)
Lymphs Abs: 1310 cells/uL (ref 850–3900)
MCH: 31.5 pg (ref 27.0–33.0)
MCHC: 33.2 g/dL (ref 32.0–36.0)
MCV: 94.8 fL (ref 80.0–100.0)
MPV: 12 fL (ref 7.5–12.5)
Monocytes Relative: 11.2 %
Neutro Abs: 2637 cells/uL (ref 1500–7800)
Neutrophils Relative %: 58.6 %
Platelets: 218 10*3/uL (ref 140–400)
RBC: 4.06 10*6/uL (ref 3.80–5.10)
RDW: 11.5 % (ref 11.0–15.0)
Total Lymphocyte: 29.1 %
WBC: 4.5 10*3/uL (ref 3.8–10.8)

## 2022-07-07 LAB — LIPID PANEL W/REFLEX DIRECT LDL
Cholesterol: 161 mg/dL (ref <200)
HDL: 98 mg/dL (ref >=50)
LDL Cholesterol (Calc): 49 mg/dL (calc)
Non-HDL Cholesterol (Calc): 63 mg/dL (calc) (ref <130)
Total CHOL/HDL Ratio: 1.6 (calc) (ref <5.0)
Triglycerides: 62 mg/dL (ref <150)

## 2022-07-07 LAB — COMPLETE METABOLIC PANEL WITH GFR
AG Ratio: 2.6 (calc) — ABNORMAL HIGH (ref 1.0–2.5)
ALT: 28 U/L (ref 6–29)
AST: 31 U/L (ref 10–35)
Albumin: 4.5 g/dL (ref 3.6–5.1)
Alkaline phosphatase (APISO): 58 U/L (ref 37–153)
BUN/Creatinine Ratio: 16 (calc) (ref 6–22)
BUN: 18 mg/dL (ref 7–25)
CO2: 24 mmol/L (ref 20–32)
Calcium: 9.8 mg/dL (ref 8.6–10.4)
Chloride: 100 mmol/L (ref 98–110)
Creat: 1.11 mg/dL — ABNORMAL HIGH (ref 0.60–1.00)
Globulin: 1.7 g/dL (calc) — ABNORMAL LOW (ref 1.9–3.7)
Glucose, Bld: 88 mg/dL (ref 65–99)
Potassium: 4.5 mmol/L (ref 3.5–5.3)
Sodium: 134 mmol/L — ABNORMAL LOW (ref 135–146)
Total Bilirubin: 0.4 mg/dL (ref 0.2–1.2)
Total Protein: 6.2 g/dL (ref 6.1–8.1)
eGFR: 52 mL/min/{1.73_m2} — ABNORMAL LOW (ref >=60)

## 2022-07-07 LAB — TSH: TSH: 0.9 mIU/L (ref 0.40–4.50)

## 2022-07-08 ENCOUNTER — Other Ambulatory Visit: Payer: Self-pay | Admitting: Family Medicine

## 2022-07-28 ENCOUNTER — Other Ambulatory Visit: Payer: Self-pay | Admitting: Family Medicine

## 2022-08-11 ENCOUNTER — Ambulatory Visit (INDEPENDENT_AMBULATORY_CARE_PROVIDER_SITE_OTHER): Payer: Medicare HMO

## 2022-08-11 DIAGNOSIS — Z0000 Encounter for general adult medical examination without abnormal findings: Secondary | ICD-10-CM

## 2022-08-11 DIAGNOSIS — Z1231 Encounter for screening mammogram for malignant neoplasm of breast: Secondary | ICD-10-CM | POA: Diagnosis not present

## 2022-09-08 ENCOUNTER — Other Ambulatory Visit: Payer: Self-pay | Admitting: Family Medicine

## 2022-09-09 ENCOUNTER — Ambulatory Visit: Payer: Medicare HMO | Admitting: Sports Medicine

## 2022-09-20 ENCOUNTER — Ambulatory Visit (INDEPENDENT_AMBULATORY_CARE_PROVIDER_SITE_OTHER): Payer: Medicare HMO | Admitting: Sports Medicine

## 2022-09-20 DIAGNOSIS — M5416 Radiculopathy, lumbar region: Secondary | ICD-10-CM | POA: Diagnosis not present

## 2022-09-20 MED ORDER — TRAMADOL HCL 50 MG PO TABS: 50.0000 mg | ORAL_TABLET | Freq: Every day | ORAL | 0 refills | Status: DC | PRN

## 2022-09-20 NOTE — Assessment & Plan Note (Signed)
This is a very pleasant 71 year old female, history of rheumatoid arthritis currently on leflunomide, known lumbar DDD, she had a right L4-L5 interlaminar epidural back in June 2023 and had about 95% improvement in her pain, she continues with gabapentin. Unfortunately she is having recurrence of pain, she will continue gabapentin, adding a short course of tramadol and I would like another interlaminar epidural.  L4-L5. Return to see me if needed 6 weeks after the injection.

## 2022-09-20 NOTE — Progress Notes (Signed)
    Procedures performed today:    None.  Independent interpretation of notes and tests performed by another provider:   None.  Brief History, Exam, Impression, and Recommendations:    Right lumbar radiculitis This is a very pleasant 70 year old female, history of rheumatoid arthritis currently on leflunomide, known lumbar DDD, she had a right L4-L5 interlaminar epidural back in June 2023 and had about 95% improvement in her pain, she continues with gabapentin. Unfortunately she is having recurrence of pain, she will continue gabapentin, adding a short course of tramadol and I would like another interlaminar epidural.  L4-L5. Return to see me if needed 6 weeks after the injection.  Chronic process with exacerbation and pharmacologic intervention  ____________________________________________ Gwen Her. Dianah Field, M.D., ABFM., CAQSM., AME. Primary Care and Sports Medicine [place] [facility]  Adjunct Professor of [employer]  Risk manager

## 2022-09-23 ENCOUNTER — Encounter: Payer: Self-pay | Admitting: Sports Medicine

## 2022-10-05 ENCOUNTER — Ambulatory Visit
Admission: RE | Admit: 2022-10-05 | Discharge: 2022-10-05 | Disposition: A | Discharge status: Home / Self Care | Payer: Medicare HMO | Source: Ambulatory Visit | Attending: Sports Medicine | Admitting: Sports Medicine

## 2022-10-05 DIAGNOSIS — M47817 Spondylosis without myelopathy or radiculopathy, lumbosacral region: Secondary | ICD-10-CM | POA: Diagnosis not present

## 2022-10-05 DIAGNOSIS — M48061 Spinal stenosis, lumbar region without neurogenic claudication: Secondary | ICD-10-CM | POA: Diagnosis not present

## 2022-10-05 DIAGNOSIS — M5416 Radiculopathy, lumbar region: Secondary | ICD-10-CM

## 2022-10-05 MED ORDER — IOPAMIDOL (ISOVUE-M 200) INJECTION 41%
1.0000 mL | Freq: Once | INTRAMUSCULAR | Status: AC
  Administered 2022-10-05: 1 mL via EPIDURAL

## 2022-10-05 MED ORDER — METHYLPREDNISOLONE ACETATE 40 MG/ML INJ SUSP (RADIOLOG
80.0000 mg | Freq: Once | INTRAMUSCULAR | Status: AC
  Administered 2022-10-05: 80 mg via EPIDURAL

## 2022-10-05 NOTE — Discharge Instructions (Signed)

## 2022-10-07 ENCOUNTER — Other Ambulatory Visit: Payer: Self-pay | Admitting: Family Medicine

## 2022-10-09 ENCOUNTER — Other Ambulatory Visit: Payer: Self-pay | Admitting: Family Medicine

## 2022-10-29 ENCOUNTER — Telehealth: Payer: Medicare HMO | Admitting: Physician Assistant

## 2022-10-29 DIAGNOSIS — J069 Acute upper respiratory infection, unspecified: Secondary | ICD-10-CM | POA: Diagnosis not present

## 2022-10-29 DIAGNOSIS — B9689 Other specified bacterial agents as the cause of diseases classified elsewhere: Secondary | ICD-10-CM | POA: Diagnosis not present

## 2022-10-29 MED ORDER — AMOXICILLIN-POT CLAVULANATE 875-125 MG PO TABS: 1.0000 | ORAL_TABLET | Freq: Two times a day (BID) | ORAL | 0 refills | Status: DC

## 2022-10-29 NOTE — Progress Notes (Signed)
Virtual Visit Consent   Melody Moore, you are scheduled for a virtual visit with a [facility] provider today. Just as with appointments in the office, your consent must be obtained to participate. Your consent will be active for this visit and any virtual visit you may have with one of our providers in the next 365 days. If you have a MyChart account, a copy of this consent can be sent to you electronically.  As this is a virtual visit, video technology does not allow for your provider to perform a traditional examination. This may limit your provider's ability to fully assess your condition. If your provider identifies any concerns that need to be evaluated in person or the need to arrange testing (such as labs, EKG, etc.), we will make arrangements to do so. Although advances in technology are sophisticated, we cannot ensure that it will always work on either your end or our end. If the connection with a video visit is poor, the visit may have to be switched to a telephone visit. With either a video or telephone visit, we are not always able to ensure that we have a secure connection.  By engaging in this virtual visit, you consent to the provision of healthcare and authorize for your insurance to be billed (if applicable) for the services provided during this visit. Depending on your insurance coverage, you may receive a charge related to this service.  I need to obtain your verbal consent now. Are you willing to proceed with your visit today? Melody Moore has provided verbal consent on 10/29/2022 for a virtual visit (video or telephone). Melody Daring, PA-C  Date: 10/29/2022 7:52 AM  Virtual Visit via Video Note   I, Melody Moore, connected with  Melody Moore  ([id number], 1951/06/15) on 10/29/22 at  7:45 AM EST by a video-enabled telemedicine application and verified that I am speaking with the correct person using two identifiers.  Location: Patient: Virtual Visit  Location Patient: Home Provider: Virtual Visit Location Provider: Home Office   I discussed the limitations of evaluation and management by telemedicine and the availability of in person appointments. The patient expressed understanding and agreed to proceed.    History of Present Illness: Melody Moore is a 72 y.o. who identifies as a female who was assigned female at birth, and is being seen today for URI symptoms.  HPI: URI  This is a new problem. The current episode started in the past 7 days. The problem has been gradually worsening. There has been no fever. Associated symptoms include congestion, coughing, headaches, rhinorrhea, sinus pain and a sore throat. Pertinent negatives include no chest pain, diarrhea, ear pain, nausea, plugged ear sensation or vomiting. Associated symptoms comments: Congested voice, chills, sweats. Treatments tried: vicks vapor rub, robitussin hbp. The treatment provided no relief.  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   Primary generalized (osteo)arthritis 07/03/2021   COVID-19 02/23/2021   Postherpetic neuralgia 05/21/2019   Normocytic anemia 02/22/2018   Encounter for monitoring statin therapy 02/22/2018   Dyslipidemia 02/22/2018   Primary osteoarthritis of right knee 12/16/2017   Benign hypertension with CKD (chronic kidney disease) stage III ([place]) 12/04/2017   History of echocardiogram 11/07/2017   Chronic kidney disease, stage 3a ([place]) 11/02/2017  Immunosuppression due to drug therapy ([place]) 11/02/2017   Gallstones 06/22/2017   Elevated alkaline phosphatase level 06/15/2017   Transaminitis 06/15/2017   Acquired hypothyroidism 05/26/2017   Essential hypertension 05/26/2017   H/O total  knee replacement, left 05/26/2017   History of adrenal insufficiency 05/26/2017   Rheumatoid arthritis involving multiple sites ([place]) 05/26/2017   Allergic rhinitis 05/26/2017   Right carotid bruit 05/26/2017   Carotid stenosis, asymptomatic, bilateral 05/26/2017   Allergy to honey bee venom 05/26/2017    Allergies:  Allergies  Allergen Reactions   Bee Venom Anaphylaxis   Methotrexate Derivatives Other (See Comments)    Acute kidney injury   Oxycodone Other (See Comments)    Adrenal crisis while taking both prednisone and oxycodone    Prednisone     Adrenal crisis while taking both prednisone and oxycodone    Verapamil Hives   Latex Rash   Nickel Rash   Nitrofuran Derivatives Rash   Sulfasalazine Diarrhea and Nausea And Vomiting   Medications:  Current Outpatient Medications:    amoxicillin-clavulanate (AUGMENTIN) 875-125 MG tablet, Take 1 tablet by mouth 2 (two) times daily., Disp: 20 tablet, Rfl: 0   acetaminophen (TYLENOL) 650 MG CR tablet, Take 2 tablets (1,300 mg total) by mouth 2 (two) times daily as needed for pain., Disp: , Rfl:    amLODipine (NORVASC) 10 MG tablet, TAKE 1 TABLET BY MOUTH EVERY DAY, Disp: 90 tablet, Rfl: 3   aspirin EC 81 MG tablet, Take 81 mg by mouth daily., Disp: , Rfl:    atorvastatin (LIPITOR) 40 MG tablet, TAKE 1 TABLET BY MOUTH EVERY DAY, Disp: 90 tablet, Rfl: 3   Calcium Carb-Cholecalciferol (CALCIUM 600 + D PO), Take 2 tablets by mouth daily., Disp: , Rfl:    Cholecalciferol (VITAMIN D) 2000 units tablet, Take 2,000 Units by mouth daily., Disp: , Rfl:    cyclobenzaprine (FLEXERIL) 10 MG tablet, One half to one tab PO qHS, then increase gradually to one tab TID., Disp: 30 tablet, Rfl: 0   docusate sodium (COLACE) 100 MG capsule, Take 100 mg by mouth daily., Disp: , Rfl:    EPINEPHRINE 0.3 mg/0.3 mL IJ SOAJ injection, INJECT 0.3 MLS (0.3 MG TOTAL) INTO THE MUSCLE ONCE AS NEEDED FOR UP TO 1 DOSE., Disp: 2 each, Rfl: 3   ferrous sulfate 325 (65 FE) MG  tablet, Take 325 mg by mouth daily with breakfast., Disp: , Rfl:    fluticasone (FLONASE) 50 MCG/ACT nasal spray, SPRAY 2 SPRAYS INTO EACH NOSTRIL EVERY DAY (Patient taking differently: Place 1 spray into both nostrils.), Disp: 48 mL, Rfl: 6   gabapentin (NEURONTIN) 300 MG capsule, TAKE 1 CAPSULE BY MOUTH EVERYDAY AT BEDTIME, Disp: 90 capsule, Rfl: 3   hydroxychloroquine (PLAQUENIL) 200 MG tablet, Take 200 mg by mouth 2 (two) times daily. , Disp: , Rfl:    leflunomide (ARAVA) 20 MG tablet, Take 1 tablet by mouth daily., Disp: , Rfl:    leucovorin (WELLCOVORIN) 5 MG tablet, Take 5 mg by mouth daily. , Disp: , Rfl:    levothyroxine (SYNTHROID) 50 MCG tablet, TAKE 1 TABLET BY MOUTH EVERY DAY BEFORE BREAKFAST, Disp: 90 tablet, Rfl: 3   lisinopril (ZESTRIL) 20 MG tablet, TAKE 1 TABLET BY MOUTH EVERY DAY, Disp: 90 tablet, Rfl: 3   traMADol (ULTRAM) 50 MG tablet, Take 1 tablet (50 mg total) by mouth daily as needed., Disp: 30 tablet, Rfl: 0   traZODone (DESYREL) 50 MG tablet, TAKE 1 TO 2 TABLETS BY MOUTH AT BEDTIME  AS NEEDED, Disp: 180 tablet, Rfl: 0   triamcinolone ointment (KENALOG) 0.5 %, Apply 1 application topically daily as needed., Disp: , Rfl:    triazolam (HALCION) 0.25 MG tablet, 1-2 tabs PO 2 hours before procedure or imaging.  Do not drive with this medication., Disp: 2 tablet, Rfl: 0   Turmeric 500 MG CAPS, Take by mouth daily., Disp: , Rfl:   Observations/Objective: Patient is well-developed, well-nourished in no acute distress.  Resting comfortably at home.  Head is normocephalic, atraumatic.  No labored breathing.  Speech is clear and coherent with logical content.  Patient is alert and oriented at baseline.    Assessment and Plan: 1. Bacterial upper respiratory infection - amoxicillin-clavulanate (AUGMENTIN) 875-125 MG tablet; Take 1 tablet by mouth 2 (two) times daily.  Dispense: 20 tablet; Refill: 0  - Worsening symptoms that have not responded to OTC medications.  - Will give  Augmentin - Continue allergy medications.  - Steam and humidifier can help - Stay well hydrated and get plenty of rest.  - Seek in person evaluation if no symptom improvement or if symptoms worsen   Follow Up Instructions: I discussed the assessment and treatment plan with the patient. The patient was provided an opportunity to ask questions and all were answered. The patient agreed with the plan and demonstrated an understanding of the instructions.  A copy of instructions were sent to the patient via MyChart unless otherwise noted below.    The patient was advised to call back or seek an in-person evaluation if the symptoms worsen or if the condition fails to improve as anticipated.  Time:  I spent 10 minutes with the patient via telehealth technology discussing the above problems/concerns.    Melody Daring, PA-C

## 2022-10-29 NOTE — Patient Instructions (Signed)
[name], thank you for joining Mar Daring, PA-C for today's virtual visit.  While this provider is not your primary care provider (PCP), if your PCP is located in our provider database this encounter information will be shared with them immediately following your visit.   [facility] account gives you access to today's visit and all your visits, tests, and labs performed at [facility] " click here if you don't have a [facility] account or go to [email or web address]  Consent: (Patient) Melody Moore provided verbal consent for this virtual visit at the beginning of the encounter.  Current Medications:  Current Outpatient Medications:    amoxicillin-clavulanate (AUGMENTIN) 875-125 MG tablet, Take 1 tablet by mouth 2 (two) times daily., Disp: 20 tablet, Rfl: 0   acetaminophen (TYLENOL) 650 MG CR tablet, Take 2 tablets (1,300 mg total) by mouth 2 (two) times daily as needed for pain., Disp: , Rfl:    amLODipine (NORVASC) 10 MG tablet, TAKE 1 TABLET BY MOUTH EVERY DAY, Disp: 90 tablet, Rfl: 3   aspirin EC 81 MG tablet, Take 81 mg by mouth daily., Disp: , Rfl:    atorvastatin (LIPITOR) 40 MG tablet, TAKE 1 TABLET BY MOUTH EVERY DAY, Disp: 90 tablet, Rfl: 3   Calcium Carb-Cholecalciferol (CALCIUM 600 + D PO), Take 2 tablets by mouth daily., Disp: , Rfl:    Cholecalciferol (VITAMIN D) 2000 units tablet, Take 2,000 Units by mouth daily., Disp: , Rfl:    cyclobenzaprine (FLEXERIL) 10 MG tablet, One half to one tab PO qHS, then increase gradually to one tab TID., Disp: 30 tablet, Rfl: 0   docusate sodium (COLACE) 100 MG capsule, Take 100 mg by mouth daily., Disp: , Rfl:    EPINEPHRINE 0.3 mg/0.3 mL IJ SOAJ injection, INJECT 0.3 MLS (0.3 MG TOTAL) INTO THE MUSCLE ONCE AS NEEDED FOR UP TO 1 DOSE., Disp: 2 each, Rfl: 3   ferrous sulfate 325 (65 FE) MG tablet, Take 325 mg by mouth daily with breakfast., Disp: , Rfl:    fluticasone (FLONASE) 50 MCG/ACT  nasal spray, SPRAY 2 SPRAYS INTO EACH NOSTRIL EVERY DAY (Patient taking differently: Place 1 spray into both nostrils.), Disp: 48 mL, Rfl: 6   gabapentin (NEURONTIN) 300 MG capsule, TAKE 1 CAPSULE BY MOUTH EVERYDAY AT BEDTIME, Disp: 90 capsule, Rfl: 3   hydroxychloroquine (PLAQUENIL) 200 MG tablet, Take 200 mg by mouth 2 (two) times daily. , Disp: , Rfl:    leflunomide (ARAVA) 20 MG tablet, Take 1 tablet by mouth daily., Disp: , Rfl:    leucovorin (WELLCOVORIN) 5 MG tablet, Take 5 mg by mouth daily. , Disp: , Rfl:    levothyroxine (SYNTHROID) 50 MCG tablet, TAKE 1 TABLET BY MOUTH EVERY DAY BEFORE BREAKFAST, Disp: 90 tablet, Rfl: 3   lisinopril (ZESTRIL) 20 MG tablet, TAKE 1 TABLET BY MOUTH EVERY DAY, Disp: 90 tablet, Rfl: 3   traMADol (ULTRAM) 50 MG tablet, Take 1 tablet (50 mg total) by mouth daily as needed., Disp: 30 tablet, Rfl: 0   traZODone (DESYREL) 50 MG tablet, TAKE 1 TO 2 TABLETS BY MOUTH AT BEDTIME AS NEEDED, Disp: 180 tablet, Rfl: 0   triamcinolone ointment (KENALOG) 0.5 %, Apply 1 application topically daily as needed., Disp: , Rfl:    triazolam (HALCION) 0.25 MG tablet, 1-2 tabs PO 2 hours before procedure or imaging.  Do not drive with this medication., Disp: 2 tablet, Rfl: 0   Turmeric 500 MG CAPS, Take by mouth daily., Disp: ,  Rfl:    Medications ordered in this encounter:  Meds ordered this encounter  Medications   amoxicillin-clavulanate (AUGMENTIN) 875-125 MG tablet    Sig: Take 1 tablet by mouth 2 (two) times daily.    Dispense:  20 tablet    Refill:  0    Order Specific Question:   Supervising Provider    Answer:   Chase Picket [id number]     *If you need refills on other medications prior to your next appointment, please contact your pharmacy*  Follow-Up: Call back or seek an in-person evaluation if the symptoms worsen or if the condition fails to improve as anticipated.  [place] [phone]  Other Instructions  Upper Respiratory  Infection, Adult An upper respiratory infection (URI) is a common viral infection of the nose, throat, and upper air passages that lead to the lungs. The most common type of URI is the common cold. URIs usually get better on their own, without medical treatment. What are the causes? A URI is caused by a virus. You may catch a virus by: Breathing in droplets from an infected person's cough or sneeze. Touching something that has been exposed to the virus (is contaminated) and then touching your mouth, nose, or eyes. What increases the risk? You are more likely to get a URI if: You are very young or very old. You have close contact with others, such as at work, school, or a health care facility. You smoke. You have long-term (chronic) heart or lung disease. You have a weakened disease-fighting system (immune system). You have nasal allergies or asthma. You are experiencing a lot of stress. You have poor nutrition. What are the signs or symptoms? A URI usually involves some of the following symptoms: Runny or stuffy (congested) nose. Cough. Sneezing. Sore throat. Headache. Fatigue. Fever. Loss of appetite. Pain in your forehead, behind your eyes, and over your cheekbones (sinus pain). Muscle aches. Redness or irritation of the eyes. Pressure in the ears or face. How is this diagnosed? This condition may be diagnosed based on your medical history and symptoms, and a physical exam. Your health care provider may use a swab to take a mucus sample from your nose (nasal swab). This sample can be tested to determine what virus is causing the illness. How is this treated? URIs usually get better on their own within 7-10 days. Medicines cannot cure URIs, but your health care provider may recommend certain medicines to help relieve symptoms, such as: Over-the-counter cold medicines. Cough suppressants. Coughing is a type of defense against infection that helps to clear the respiratory system, so  take these medicines only as recommended by your health care provider. Fever-reducing medicines. Follow these instructions at home: Activity Rest as needed. If you have a fever, stay home from work or school until your fever is gone or until your health care provider says your URI cannot spread to other people (is no longer contagious). Your health care provider may have you wear a face mask to prevent your infection from spreading. Relieving symptoms Gargle with a mixture of salt and water 3-4 times a day or as needed. To make salt water, completely dissolve -1 tsp (3-6 g) of salt in 1 cup (237 mL) of warm water. Use a cool-mist humidifier to add moisture to the air. This can help you breathe more easily. Eating and drinking  Drink enough fluid to keep your urine pale yellow. Eat soups and other clear broths. General instructions  Take  over-the-counter and prescription medicines only as told by your health care provider. These include cold medicines, fever reducers, and cough suppressants. Do not use any products that contain nicotine or tobacco. These products include cigarettes, chewing tobacco, and vaping devices, such as e-cigarettes. If you need help quitting, ask your health care provider. Stay away from secondhand smoke. Stay up to date on all immunizations, including the yearly (annual) flu vaccine. Keep all follow-up visits. This is important. How to prevent the spread of infection to others URIs can be contagious. To prevent the infection from spreading: Wash your hands with soap and water for at least 20 seconds. If soap and water are not available, use hand sanitizer. Avoid touching your mouth, face, eyes, or nose. Cough or sneeze into a tissue or your sleeve or elbow instead of into your hand or into the air.  Contact a health care provider if: You are getting worse instead of better. You have a fever or chills. Your mucus is brown or red. You have yellow or brown  discharge coming from your nose. You have pain in your face, especially when you bend forward. You have swollen neck glands. You have pain while swallowing. You have white areas in the back of your throat. Get help right away if: You have shortness of breath that gets worse. You have severe or persistent: Headache. Ear pain. Sinus pain. Chest pain. You have chronic lung disease along with any of the following: Making high-pitched whistling sounds when you breathe, most often when you breathe out (wheezing). Prolonged cough (more than 14 days). Coughing up blood. A change in your usual mucus. You have a stiff neck. You have changes in your: Vision. Hearing. Thinking. Mood. These symptoms may be an emergency. Get help right away. Call 911. Do not wait to see if the symptoms will go away. Do not drive yourself to the hospital. Summary An upper respiratory infection (URI) is a common infection of the nose, throat, and upper air passages that lead to the lungs. A URI is caused by a virus. URIs usually get better on their own within 7-10 days. Medicines cannot cure URIs, but your health care provider may recommend certain medicines to help relieve symptoms. This information is not intended to replace advice given to you by your health care provider. Make sure you discuss any questions you have with your health care provider. Document Revised: 04/22/2021 Document Reviewed: 04/22/2021 Elsevier Patient Education  [place].    If you have been instructed to have an in-person evaluation today at a local Urgent Care facility, please use the link below. It will take you to a list of all of our available [facility], including address, phone number and hours of operation. Please do not delay care.  [facility]  If you or a family member do not have a primary care provider, use the link below to schedule a visit and establish care. When you choose a [facility] primary care physician or advanced practice provider, you gain a long-term partner in health. Find a Primary Care Provider  Learn more about [facility]'s in-office and virtual care options: [facility]

## 2022-11-05 ENCOUNTER — Ambulatory Visit: Payer: Medicare HMO | Admitting: Sports Medicine

## 2022-11-09 ENCOUNTER — Ambulatory Visit (INDEPENDENT_AMBULATORY_CARE_PROVIDER_SITE_OTHER): Payer: Medicare HMO | Admitting: Physician Assistant

## 2022-11-09 ENCOUNTER — Encounter: Payer: Self-pay | Admitting: Physician Assistant

## 2022-11-09 VITALS — BP 161/55 | HR 71 | Temp 97.1°F | Ht 67.0 in | Wt 168.0 lb

## 2022-11-09 DIAGNOSIS — Z79899 Other long term (current) drug therapy: Secondary | ICD-10-CM

## 2022-11-09 DIAGNOSIS — J209 Acute bronchitis, unspecified: Secondary | ICD-10-CM

## 2022-11-09 MED ORDER — ALBUTEROL SULFATE (2.5 MG/3ML) 0.083% IN NEBU
2.5000 mg | INHALATION_SOLUTION | Freq: Once | RESPIRATORY_TRACT | Status: AC
  Administered 2022-11-09: 2.5 mg via RESPIRATORY_TRACT

## 2022-11-09 MED ORDER — METHYLPREDNISOLONE ACETATE 40 MG/ML IJ SUSP
40.0000 mg | Freq: Once | INTRAMUSCULAR | Status: AC
  Administered 2022-11-09: 40 mg via INTRAMUSCULAR

## 2022-11-09 MED ORDER — ALBUTEROL SULFATE HFA 108 (90 BASE) MCG/ACT IN AERS: 2.0000 | INHALATION_SPRAY | Freq: Four times a day (QID) | RESPIRATORY_TRACT | 0 refills | Status: DC | PRN

## 2022-11-09 MED ORDER — METHYLPREDNISOLONE SODIUM SUCC 125 MG IJ SOLR
125.0000 mg | Freq: Once | INTRAMUSCULAR | Status: AC
  Administered 2022-11-09: 125 mg via INTRAMUSCULAR

## 2022-11-09 NOTE — Progress Notes (Signed)
Acute Office Visit  Subjective:     Patient ID: Melody Moore, female    DOB: 12-Aug-1951, 72 y.o.   MRN: [id number]  No chief complaint on file.   HPI Patient is in today for follow up on bacterial upper respiratory infection seen on video visit on 10/29/2022. She was given augmentin and she has finished it. Her sinus symptoms have cleared but her cough and chest tightness have not. She has a past medical history of smoking but no recent smoking or exposure. She is taking OTC medications for cough. She is short of breath. No leg pain or swelling.  .. Active Ambulatory Problems    Diagnosis Date Noted   Acquired hypothyroidism 05/26/2017   Essential hypertension 05/26/2017   H/O total knee replacement, left 05/26/2017   History of adrenal insufficiency 05/26/2017   Rheumatoid arthritis involving multiple sites ([place]) 05/26/2017   Allergic rhinitis 05/26/2017   Right carotid bruit 05/26/2017   Carotid stenosis, asymptomatic, bilateral 05/26/2017   Allergy to honey bee venom 05/26/2017   Elevated alkaline phosphatase level 06/15/2017   Transaminitis 06/15/2017   Gallstones 06/22/2017   Chronic kidney disease, stage 3a ([place]) 11/02/2017   Immunosuppression due to drug therapy ([place]) 11/02/2017   History of echocardiogram 11/07/2017   Benign hypertension with CKD (chronic kidney disease) stage III ([place]) 12/04/2017   Primary osteoarthritis of right knee 12/16/2017   Normocytic anemia 02/22/2018   Encounter for monitoring statin therapy 02/22/2018   Dyslipidemia 02/22/2018   Postherpetic neuralgia 05/21/2019   COVID-19 02/23/2021   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 ([place]) 07/03/2021   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   Primary generalized (osteo)arthritis 07/03/2021   Right lumbar radiculitis 03/05/2022   Primary osteoarthritis, 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   Resolved Ambulatory Problems    Diagnosis Date Noted   Osteoarthritis of right knee 05/26/2017   Decreased calculated GFR 06/15/2017    Elevated serum creatinine 06/15/2017   Primary osteoarthritis involving multiple joints 12/04/2017   Blister of right heel 01/04/2018   Encounter for long-term (current) use of medications 05/24/2018   Left ankle injury, initial encounter 05/24/2018   Impacted cerumen of left ear 05/24/2018   Elevated blood pressure reading in office with diagnosis of hypertension 05/24/2018   Contact dermatitis 01/08/2019   Hyperglycemia, unspecified 01/15/2019   Herpes zoster ophthalmicus of left eye 05/07/2019   Past Medical History:  Diagnosis Date   Adrenal crisis ([place]) 02/12/2016   AKI (acute kidney injury) ([place]) 02/2016   Allergy    Arthritis    Cataract    CKD (chronic kidney disease) stage 3, GFR 30-59 ml/min ([place]) 11/02/2017   Hypertension    Hypothyroidism    Rheumatoid arthritis ([place])      ROS  See HPI.     Objective:    BP (!) 161/55   Pulse 71   Temp (!) 97.1 F (36.2 C)   Ht '5\' 7"'$  (1.702 m)   Wt 168 lb (76.2 kg)   SpO2 99%   BMI 26.31 kg/m  BP Readings from Last 3 Encounters:  11/09/22 (!) 161/55  10/05/22 (!) 188/72  07/06/22 136/82   Wt Readings from Last 3 Encounters:  11/09/22 168 lb (76.2 kg)  07/06/22 181 lb (82.1 kg)  05/24/22 182 lb (82.6 kg)    Duoneb given in office today.   Physical Exam Constitutional:      Appearance: Normal appearance.  HENT:     Head: Normocephalic.     Right Ear: Tympanic membrane, ear canal and external ear normal. There  is no impacted cerumen.     Left Ear: Tympanic membrane, ear canal and external ear normal. There is no impacted cerumen.     Nose: Nose normal.     Mouth/Throat:     Mouth: Mucous membranes are moist.     Pharynx: Oropharyngeal exudate and posterior oropharyngeal erythema present.  Eyes:     Conjunctiva/sclera: Conjunctivae normal.  Neck:     Vascular: No carotid bruit.  Cardiovascular:     Rate and Rhythm: Normal rate and regular rhythm.     Pulses: Normal pulses.     Heart sounds: Normal heart  sounds.  Pulmonary:     Effort: Pulmonary effort is normal.     Breath sounds: Normal breath sounds. No wheezing or rhonchi.  Musculoskeletal:     Cervical back: No rigidity or tenderness.  Lymphadenopathy:     Cervical: No cervical adenopathy.  Neurological:     General: No focal deficit present.     Mental Status: She is alert and oriented to person, place, and time.  Psychiatric:        Mood and Affect: Mood normal.           Assessment & Plan:  Marland KitchenMarland KitchenDiagnoses and all orders for this visit:  Acute bronchitis, unspecified organism -     albuterol (VENTOLIN HFA) 108 (90 Base) MCG/ACT inhaler; Inhale 2 puffs into the lungs every 6 (six) hours as needed. -     albuterol (PROVENTIL) (2.5 MG/3ML) 0.083% nebulizer solution 2.5 mg -     methylPREDNISolone sodium succinate (SOLU-MEDROL) 125 mg/2 mL injection 125 mg -     methylPREDNISolone acetate (DEPO-MEDROL) injection 40 mg  Medication management -     CBC w/Diff/Platelet -     COMPLETE METABOLIC PANEL WITH GFR   Great improvement with duoneb nebulizer in office Sent albuterol to pharmacy to use as needed every 4-6 hours for cough/wheezing/SOB Solumedrol and depomedrol IM given in office today Rest and hydrate Follow up as needed or if symptoms persist  Iran Planas, PA-C

## 2022-11-09 NOTE — Patient Instructions (Signed)
  Acute Bronchitis, Adult  Acute bronchitis is when air tubes in the lungs (bronchi) suddenly get swollen. The condition can make it hard for you to breathe. In adults, acute bronchitis usually goes away within 2 weeks. A cough caused by bronchitis may last up to 3 weeks. Smoking, allergies, and asthma can make the condition worse. What are the causes? Germs that cause cold and flu (viruses). The most common cause of this condition is the virus that causes the common cold. Bacteria. Substances that bother (irritate) the lungs, including: Smoke from cigarettes and other types of tobacco. Dust and pollen. Fumes from chemicals, gases, or burned fuel. Indoor or outdoor air pollution. What increases the risk? A weak body's defense system. This is also called the immune system. Any condition that affects your lungs and breathing, such as asthma. What are the signs or symptoms? A cough. Coughing up clear, yellow, or green mucus. Making high-pitched whistling sounds when you breathe, most often when you breathe out (wheezing). Runny or stuffy nose. Having too much mucus in your lungs (chest congestion). Shortness of breath. Body aches. A sore throat. How is this treated? Acute bronchitis may go away over time without treatment. Your doctor may tell you to: Drink more fluids. This will help thin your mucus so it is easier to cough up. Use a device that gets medicine into your lungs (inhaler). Use a vaporizer or a humidifier. These are machines that add water to the air. This helps with coughing and poor breathing. Take a medicine that thins mucus and helps clear it from your lungs. Take a medicine that prevents or stops coughing. It is not common to take an antibiotic medicine for this condition. Follow these instructions at home:  Take over-the-counter and prescription medicines only as told by your doctor. Use an inhaler, vaporizer, or humidifier as told by your doctor. Take two  teaspoons (10 mL) of honey at bedtime. This helps lessen your coughing at night. Drink enough fluid to keep your pee (urine) pale yellow. Do not smoke or use any products that contain nicotine or tobacco. If you need help quitting, ask your doctor. Get a lot of rest. Return to your normal activities when your doctor says that it is safe. Keep all follow-up visits. How is this prevented?  Wash your hands often with soap and water for at least 20 seconds. If you cannot use soap and water, use hand sanitizer. Avoid contact with people who have cold symptoms. Try not to touch your mouth, nose, or eyes with your hands. Avoid breathing in smoke or chemical fumes. Make sure to get the flu shot every year. Contact a doctor if: Your symptoms do not get better in 2 weeks. You have trouble coughing up the mucus. Your cough keeps you awake at night. You have a fever. Get help right away if: You cough up blood. You have chest pain. You have very bad shortness of breath. You faint or keep feeling like you are going to faint. You have a very bad headache. Your fever or chills get worse. These symptoms may be an emergency. Get help right away. Call your local emergency services (911 in the [place] Do not wait to see if the symptoms will go away. Do not drive yourself to the hospital. Summary Acute bronchitis is when air tubes in the lungs (bronchi) suddenly get swollen. In adults, acute bronchitis usually goes away within 2 weeks. Drink more fluids. This will help thin your mucus so it   is easier to cough up. Take over-the-counter and prescription medicines only as told by your doctor. Contact a doctor if your symptoms do not improve after 2 weeks of treatment. This information is not intended to replace advice given to you by your health care provider. Make sure you discuss any questions you have with your health care provider. Document Revised: 01/21/2021 Document Reviewed: 01/21/2021 Elsevier  Patient Education  2023 Elsevier Inc.  

## 2022-11-10 LAB — COMPLETE METABOLIC PANEL WITH GFR
AG Ratio: 2.2 (calc) (ref 1.0–2.5)
ALT: 18 U/L (ref 6–29)
AST: 23 U/L (ref 10–35)
Albumin: 4.2 g/dL (ref 3.6–5.1)
Alkaline phosphatase (APISO): 60 U/L (ref 37–153)
BUN/Creatinine Ratio: 9 (calc) (ref 6–22)
BUN: 10 mg/dL (ref 7–25)
CO2: 28 mmol/L (ref 20–32)
Calcium: 9.7 mg/dL (ref 8.6–10.4)
Chloride: 100 mmol/L (ref 98–110)
Creat: 1.08 mg/dL — ABNORMAL HIGH (ref 0.60–1.00)
Globulin: 1.9 g/dL (calc) (ref 1.9–3.7)
Glucose, Bld: 91 mg/dL (ref 65–99)
Potassium: 4.2 mmol/L (ref 3.5–5.3)
Sodium: 136 mmol/L (ref 135–146)
Total Bilirubin: 0.4 mg/dL (ref 0.2–1.2)
Total Protein: 6.1 g/dL (ref 6.1–8.1)
eGFR: 54 mL/min/{1.73_m2} — ABNORMAL LOW (ref >=60)

## 2022-11-10 LAB — CBC WITH DIFFERENTIAL/PLATELET
Absolute Monocytes: 707 cells/uL (ref 200–950)
Basophils Absolute: 28 cells/uL (ref 0–200)
Basophils Relative: 0.4 %
Eosinophils Absolute: 280 cells/uL (ref 15–500)
Eosinophils Relative: 4 %
HCT: 37.8 % (ref 35.0–45.0)
Hemoglobin: 12.6 g/dL (ref 11.7–15.5)
Lymphs Abs: 1673 cells/uL (ref 850–3900)
MCH: 30.7 pg (ref 27.0–33.0)
MCHC: 33.3 g/dL (ref 32.0–36.0)
MCV: 92.2 fL (ref 80.0–100.0)
MPV: 11 fL (ref 7.5–12.5)
Monocytes Relative: 10.1 %
Neutro Abs: 4312 cells/uL (ref 1500–7800)
Neutrophils Relative %: 61.6 %
Platelets: 233 10*3/uL (ref 140–400)
RBC: 4.1 10*6/uL (ref 3.80–5.10)
RDW: 11.9 % (ref 11.0–15.0)
Total Lymphocyte: 23.9 %
WBC: 7 10*3/uL (ref 3.8–10.8)

## 2022-11-10 NOTE — Progress Notes (Signed)
Margie,   Kidney function improved some.  Sodium in normal range.  Liver function looks good.  Normal WBC.   We need to send these labs to another specialist. I have form in my box.

## 2022-12-03 ENCOUNTER — Ambulatory Visit (INDEPENDENT_AMBULATORY_CARE_PROVIDER_SITE_OTHER): Payer: Medicare HMO | Admitting: Sports Medicine

## 2022-12-03 DIAGNOSIS — M5416 Radiculopathy, lumbar region: Secondary | ICD-10-CM | POA: Diagnosis not present

## 2022-12-03 MED ORDER — TRAMADOL HCL 50 MG PO TABS: 50.0000 mg | ORAL_TABLET | Freq: Two times a day (BID) | ORAL | 3 refills | Status: AC | PRN

## 2022-12-03 NOTE — Progress Notes (Signed)
    Procedures performed today:    None.  Independent interpretation of notes and tests performed by another provider:   None.  Brief History, Exam, Impression, and Recommendations:    Right lumbar radiculitis Pleasant 72 year old female, history of rheumatoid arthritis currently on leflunomide, known lumbar DDD, she has multilevel lumbar spondylosis, historically has responded well to L4-L5 interlaminar epidurals, last 1 was back in June 2023 and then more recently in January of this year. She had partial relief, refilling tramadol for use up to twice daily, continue gabapentin, ordering her second L4-L5 interlaminar epidural, she will come back in a month and let me know how things are going and we can certainly proceed with third of 3 injections if need be.    ____________________________________________ Gwen Her. Dianah Field, M.D., ABFM., CAQSM., AME. Primary Care and Sports Medicine [facility]  Adjunct Professor of [employer]  Risk manager

## 2022-12-03 NOTE — Assessment & Plan Note (Signed)
Pleasant 72 year old female, history of rheumatoid arthritis currently on leflunomide, known lumbar DDD, she has multilevel lumbar spondylosis, historically has responded well to L4-L5 interlaminar epidurals, last 1 was back in June 2023 and then more recently in January of this year. She had partial relief, refilling tramadol for use up to twice daily, continue gabapentin, ordering her second L4-L5 interlaminar epidural, she will come back in a month and let me know how things are going and we can certainly proceed with third of 3 injections if need be.

## 2022-12-11 ENCOUNTER — Other Ambulatory Visit: Payer: Self-pay | Admitting: Family Medicine

## 2022-12-14 ENCOUNTER — Ambulatory Visit
Admission: RE | Admit: 2022-12-14 | Discharge: 2022-12-14 | Disposition: A | Discharge status: Home / Self Care | Payer: Medicare HMO | Source: Ambulatory Visit | Attending: Sports Medicine | Admitting: Sports Medicine

## 2022-12-14 DIAGNOSIS — M47817 Spondylosis without myelopathy or radiculopathy, lumbosacral region: Secondary | ICD-10-CM | POA: Diagnosis not present

## 2022-12-14 DIAGNOSIS — M5416 Radiculopathy, lumbar region: Secondary | ICD-10-CM

## 2022-12-14 DIAGNOSIS — M48061 Spinal stenosis, lumbar region without neurogenic claudication: Secondary | ICD-10-CM | POA: Diagnosis not present

## 2022-12-14 MED ORDER — METHYLPREDNISOLONE ACETATE 40 MG/ML INJ SUSP (RADIOLOG
80.0000 mg | Freq: Once | INTRAMUSCULAR | Status: AC
  Administered 2022-12-14: 80 mg via EPIDURAL

## 2022-12-14 MED ORDER — IOPAMIDOL (ISOVUE-M 200) INJECTION 41%
1.0000 mL | Freq: Once | INTRAMUSCULAR | Status: AC
  Administered 2022-12-14: 1 mL via EPIDURAL

## 2022-12-14 NOTE — Discharge Instructions (Signed)

## 2023-01-05 ENCOUNTER — Ambulatory Visit: Payer: Medicare HMO | Admitting: Family Medicine

## 2023-01-10 DIAGNOSIS — Z6826 Body mass index (BMI) 26.0-26.9, adult: Secondary | ICD-10-CM | POA: Diagnosis not present

## 2023-01-10 DIAGNOSIS — M1991 Primary osteoarthritis, unspecified site: Secondary | ICD-10-CM | POA: Diagnosis not present

## 2023-01-10 DIAGNOSIS — B0223 Postherpetic polyneuropathy: Secondary | ICD-10-CM | POA: Diagnosis not present

## 2023-01-10 DIAGNOSIS — B0230 Zoster ocular disease, unspecified: Secondary | ICD-10-CM | POA: Diagnosis not present

## 2023-01-10 DIAGNOSIS — N1831 Chronic kidney disease, stage 3a: Secondary | ICD-10-CM | POA: Diagnosis not present

## 2023-01-10 DIAGNOSIS — M8589 Other specified disorders of bone density and structure, multiple sites: Secondary | ICD-10-CM | POA: Diagnosis not present

## 2023-01-10 DIAGNOSIS — M353 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: Secondary | ICD-10-CM | POA: Diagnosis not present

## 2023-01-10 DIAGNOSIS — M069 Rheumatoid arthritis, unspecified: Secondary | ICD-10-CM | POA: Diagnosis not present

## 2023-01-10 DIAGNOSIS — M48061 Spinal stenosis, lumbar region without neurogenic claudication: Secondary | ICD-10-CM | POA: Diagnosis not present

## 2023-01-11 ENCOUNTER — Encounter: Payer: Self-pay | Admitting: Family Medicine

## 2023-01-11 ENCOUNTER — Ambulatory Visit (INDEPENDENT_AMBULATORY_CARE_PROVIDER_SITE_OTHER): Payer: Medicare HMO | Admitting: Family Medicine

## 2023-01-11 VITALS — BP 138/76 | HR 85 | Ht 67.0 in | Wt 163.0 lb

## 2023-01-11 DIAGNOSIS — J309 Allergic rhinitis, unspecified: Secondary | ICD-10-CM

## 2023-01-11 DIAGNOSIS — E785 Hyperlipidemia, unspecified: Secondary | ICD-10-CM

## 2023-01-11 DIAGNOSIS — M069 Rheumatoid arthritis, unspecified: Secondary | ICD-10-CM | POA: Diagnosis not present

## 2023-01-11 DIAGNOSIS — J0110 Acute frontal sinusitis, unspecified: Secondary | ICD-10-CM | POA: Diagnosis not present

## 2023-01-11 DIAGNOSIS — I10 Essential (primary) hypertension: Secondary | ICD-10-CM

## 2023-01-11 DIAGNOSIS — F5101 Primary insomnia: Secondary | ICD-10-CM | POA: Diagnosis not present

## 2023-01-11 DIAGNOSIS — G4700 Insomnia, unspecified: Secondary | ICD-10-CM | POA: Insufficient documentation

## 2023-01-11 DIAGNOSIS — J019 Acute sinusitis, unspecified: Secondary | ICD-10-CM | POA: Insufficient documentation

## 2023-01-11 MED ORDER — FLUTICASONE PROPIONATE 50 MCG/ACT NA SUSP: 2.0000 | Freq: Every day | NASAL | 6 refills | Status: AC

## 2023-01-11 MED ORDER — DOXYCYCLINE HYCLATE 100 MG PO TABS: 100.0000 mg | ORAL_TABLET | Freq: Two times a day (BID) | ORAL | 0 refills | Status: DC

## 2023-01-11 NOTE — Assessment & Plan Note (Signed)
BP is pretty well controlled at this time.  Continue current medications for management of HTN.

## 2023-01-11 NOTE — Progress Notes (Signed)
Melody Moore - 72 y.o. female MRN [id number]  Date of birth: April 02, 1951  Subjective Chief Complaint  Patient presents with   Sinusitis   Hypertension    HPI Melody Moore is a 72 y.o. female here today for follow up.   She thinks that she may have a sinus infection.  Symptoms started a few days ago.  She has nasal congestion and sinus pressure.  More pain on the frontal sinus.  She has had sneezing and post nasal drainage with cough.    She remains on lisinopril and amlodipine for management of HTN.  She is doing well with these at current strength.  Denies chest pain, shortness of breath, palpitations, headache or vision changes.    Tolerating atorvastatin at current strength.    Sleep is stable with trazodone.  Continues to see rheumatology for management of RA.  Stable at this time.   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   AKI (acute kidney injury) 02/2016   Allergy    Arthritis    Carotid stenosis, asymptomatic, bilateral 05/26/2017   Dopplers 02/12/2016: right - mid-distal 40-59% stenosis; left - 40-59% stenosis   Cataract    CKD (chronic kidney disease) stage 3, GFR 30-59 ml/min 11/02/2017   Decreased calculated GFR 06/15/2017   Gallstones 06/22/2017   History of echocardiogram 11/07/2017   04/09/2016: EF 66%, mild diastolic dysfunction, trivial MR and TR   Hypertension    Hypothyroidism    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    Rheumatoid arthritis    Rheumatoid arthritis involving multiple sites 05/26/2017   Transaminitis 06/15/2017   Normal abdominal US 06/22/17    Past Surgical History:  Procedure Laterality Date   APPENDECTOMY     CATARACT EXTRACTION, BILATERAL  2015   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   TOTAL KNEE ARTHROPLASTY Right 12/16/2017   Procedure: RIGHT TOTAL KNEE ARTHROPLASTY;  Surgeon: Jodi Geralds, MD;  Location: [facility];  Service: Orthopedics;  Laterality: Right;  Adductor Block    Social History   Socioeconomic History   Marital status: Married    Spouse name: Melody Moore   Number of children: 0   Years of education: 16   Highest education level: Bachelor's degree (e.g., BA, AB, BS)  Occupational History   Occupation: Production assistant, 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 She enjoys walking and making wreaths.   Social Determinants of Health   Financial Resource Strain: Low Risk  (06/28/2022)   Overall Financial Resource Strain (CARDIA)    Difficulty of Paying Living Expenses: Not hard at all  Food Insecurity: No Food Insecurity (06/28/2022)   Hunger Vital Sign    Worried About Running Out of Food in the Last Year: Never true    Ran Out of Food in the Last Year: Never true  Transportation Needs: No Transportation Needs (06/27/2021)   PRAPARE - Administrator, Civil ServiceTransportation    Lack of Transportation (Medical): No    Lack of Transportation (Non-Medical): No  Physical Activity:  Sufficiently Active (06/28/2022)   Exercise Vital Sign    Days of Exercise per Week: 7 days    Minutes of Exercise per Session: 50 min  Stress: No Stress Concern Present (06/28/2022)   [employer] - Occupational Stress Questionnaire    Feeling of Stress : Not at all  Social Connections: Socially Integrated (06/28/2022)   Social Connection and Isolation Panel [NHANES]    Frequency of Communication with Friends and Family: More than three times a week    Frequency of Social Gatherings with Friends and Family: Three times a week    Attends Religious Services: More than 4 times per year    Active Member of Clubs or Organizations: Yes    Attends BankerClub or Organization Meetings: More than 4 times per year    Marital Status: Married    Family History  Problem Relation Age of Onset   Diabetes Mother    Lung cancer Father    Hypertension Father    Uterine cancer Sister     Health Maintenance  Topic Date Due   COVID-19 Vaccine (6 - 2023-24 season) 12/02/2022   Pneumonia Vaccine 5665+ Years old (1 of 1 - PCV) 06/29/2023 (Originally 02/07/2014)   INFLUENZA VACCINE  05/05/2023   Medicare Annual Wellness (AWV)  08/12/2023   MAMMOGRAM  08/11/2024   DTaP/Tdap/Td (2 - Td or Tdap) 03/25/2027   COLONOSCOPY (Pts 45-557yrs Insurance coverage will need to be confirmed)  04/04/2027   DEXA SCAN  Completed   Hepatitis C Screening  Completed   Zoster Vaccines- Shingrix  Completed   HPV VACCINES  Aged Out     ----------------------------------------------------------------------------------------------------------------------------------------------------------------------------------------------------------------- Physical Exam BP 138/76 (BP Location: Left Arm, Patient Position: Sitting, Cuff Size: Large)   Pulse 85   Ht 5\' 7"  (1.702 m)   Wt 163 lb (73.9 kg)   SpO2 97%   BMI 25.53 kg/m   Physical Exam Constitutional:      Appearance: Normal appearance.  HENT:     Head:  Normocephalic and atraumatic.  Eyes:     General: No scleral icterus. Cardiovascular:     Rate and Rhythm: Normal rate and regular rhythm.  Pulmonary:     Effort: Pulmonary effort is normal.     Breath sounds: Normal breath sounds.  Musculoskeletal:     Cervical back: Neck supple.  Neurological:     Mental Status: She is alert.  Psychiatric:        Mood and Affect: Mood normal.        Behavior: Behavior normal.     ------------------------------------------------------------------------------------------------------------------------------------------------------------------------------------------------------------------- Assessment and Plan  Acute sinusitis She is immunocompromised, will go ahead and treat with course of doxycycline.  Start flonase and antihistamine for management of underlying allergies.    Rheumatoid arthritis involving  multiple sites [facility]) Management per rheumatology.  Stable at this time.   Essential hypertension BP is pretty well controlled at this time.  Continue current medications for management of HTN.   Dyslipidemia Tolerating atorvastatin well at current strength.    Insomnia Continue trazodone at current strength.    Meds ordered this encounter  Medications   doxycycline (VIBRA-TABS) 100 MG tablet    Sig: Take 1 tablet (100 mg total) by mouth 2 (two) times daily.    Dispense:  20 tablet    Refill:  0   fluticasone (FLONASE) 50 MCG/ACT nasal spray    Sig: Place 2 sprays into both nostrils daily. SPRAY 2 SPRAYS INTO EACH NOSTRIL EVERY DAY    Dispense:  48 mL    Refill:  6    DX Code Needed  .    Return in about 6 months (around 07/13/2023) for Annual exam/Fasting labs.    This visit occurred during the SARS-CoV-2 public health emergency.  Safety protocols were in place, including screening questions prior to the visit, additional usage of staff PPE, and extensive cleaning of exam room while observing appropriate contact time as  indicated for disinfecting solutions.

## 2023-01-11 NOTE — Assessment & Plan Note (Signed)
She is immunocompromised, will go ahead and treat with course of doxycycline.  Start flonase and antihistamine for management of underlying allergies.

## 2023-01-11 NOTE — Patient Instructions (Signed)
Start flonase Pick up cetirizine (zyrtec) or fexofenadine (allegra) over the counter. Start doxycycline See me again in 6 months

## 2023-01-11 NOTE — Assessment & Plan Note (Signed)
Tolerating atorvastatin well at current strength. °

## 2023-01-11 NOTE — Assessment & Plan Note (Signed)
Continue trazodone at current strength.   

## 2023-01-11 NOTE — Assessment & Plan Note (Signed)
Management per rheumatology.  Stable at this time.  

## 2023-01-14 ENCOUNTER — Other Ambulatory Visit: Payer: Self-pay | Admitting: Family Medicine

## 2023-01-17 ENCOUNTER — Ambulatory Visit (INDEPENDENT_AMBULATORY_CARE_PROVIDER_SITE_OTHER): Payer: Medicare HMO | Admitting: Sports Medicine

## 2023-01-17 ENCOUNTER — Encounter: Payer: Self-pay | Admitting: *Deleted

## 2023-01-17 DIAGNOSIS — M5416 Radiculopathy, lumbar region: Secondary | ICD-10-CM | POA: Diagnosis not present

## 2023-01-17 NOTE — Progress Notes (Signed)
    Procedures performed today:    None.  Independent interpretation of notes and tests performed by another provider:   None.  Brief History, Exam, Impression, and Recommendations:    Right lumbar radiculitis Melody Moore returns, she is a very pleasant 72 year old female, history of rheumatoid arthritis on leflunomide, she has multilevel lumbar DDD, worst at L3-L4 with severe spinal stenosis, lesser so at L4-L5. She has had a couple of epidurals, the first was L4-L5, the second was L5-S1 as the L4-L5 interspace was not accessible. I think we should proceed with a right L3-L4 transforaminal epidural as this is her most severe level on MRI, she will continue gabapentin and tramadol for now. Return to see me 6 weeks after injection.    ____________________________________________ Ihor Austin. Benjamin Stain, M.D., ABFM., CAQSM., AME. Primary Care and Sports Medicine [facility]  Adjunct Professor of Family Medicine  [employer] [facility] [employer] [facility] [employer]  Restaurant manager, fast food

## 2023-01-17 NOTE — Assessment & Plan Note (Signed)
Melody Moore returns, she is a very pleasant 72 year old female, history of rheumatoid arthritis on leflunomide, she has multilevel lumbar DDD, worst at L3-L4 with severe spinal stenosis, lesser so at L4-L5. She has had a couple of epidurals, the first was L4-L5, the second was L5-S1 as the L4-L5 interspace was not accessible. I think we should proceed with a right L3-L4 transforaminal epidural as this is her most severe level on MRI, she will continue gabapentin and tramadol for now. Return to see me 6 weeks after injection.

## 2023-01-24 ENCOUNTER — Ambulatory Visit
Admission: RE | Admit: 2023-01-24 | Discharge: 2023-01-24 | Disposition: A | Discharge status: Home / Self Care | Payer: Medicare HMO | Source: Ambulatory Visit | Attending: Sports Medicine | Admitting: Sports Medicine

## 2023-01-24 DIAGNOSIS — M5416 Radiculopathy, lumbar region: Secondary | ICD-10-CM

## 2023-01-24 DIAGNOSIS — M4727 Other spondylosis with radiculopathy, lumbosacral region: Secondary | ICD-10-CM | POA: Diagnosis not present

## 2023-01-24 MED ORDER — IOPAMIDOL (ISOVUE-M 200) INJECTION 41%
1.0000 mL | Freq: Once | INTRAMUSCULAR | Status: AC
  Administered 2023-01-24: 1 mL via EPIDURAL

## 2023-01-24 MED ORDER — METHYLPREDNISOLONE ACETATE 40 MG/ML INJ SUSP (RADIOLOG
80.0000 mg | Freq: Once | INTRAMUSCULAR | Status: AC
  Administered 2023-01-24: 80 mg via EPIDURAL

## 2023-01-24 NOTE — Discharge Instructions (Signed)

## 2023-03-01 ENCOUNTER — Ambulatory Visit (INDEPENDENT_AMBULATORY_CARE_PROVIDER_SITE_OTHER): Payer: Medicare HMO | Admitting: Sports Medicine

## 2023-03-01 DIAGNOSIS — M5416 Radiculopathy, lumbar region: Secondary | ICD-10-CM

## 2023-03-01 NOTE — Progress Notes (Signed)
    Procedures performed today:    None.  Independent interpretation of notes and tests performed by another provider:   None.  Brief History, Exam, Impression, and Recommendations:    Right lumbar radiculitis Melody Moore returns, she is a very pleasant 72 year old female, she has a history of rheumatoid arthritis and is currently on leflunomide, multilevel lumbar DDD worse at L3-L4 with severe spinal stenosis, lesser so at L4-L5, she has had a couple of epidurals, the first L4-L5, the second L5-S1 as the L4-L5 interspace was inaccessible. We last proceeded with a right L3-L4 transforaminal epidural, she returns today having had fantastic relief, essentially complete relief, she understands that if she does have a recurrence of discomfort we will proceed with an additional right L3-L4 transforaminal epidural, but the soonest we could do this would be July of this year. Continue gabapentin and tramadol for now.    ____________________________________________ Ihor Austin. Benjamin Stain, M.D., ABFM., CAQSM., AME. Primary Care and Sports Medicine [facility]  Adjunct Professor of Family Medicine  [place] [employer] [facility] [employer]  Restaurant manager, fast food

## 2023-03-01 NOTE — Assessment & Plan Note (Signed)
Melody Moore returns, she is a very pleasant 72 year old female, she has a history of rheumatoid arthritis and is currently on leflunomide, multilevel lumbar DDD worse at L3-L4 with severe spinal stenosis, lesser so at L4-L5, she has had a couple of epidurals, the first L4-L5, the second L5-S1 as the L4-L5 interspace was inaccessible. We last proceeded with a right L3-L4 transforaminal epidural, she returns today having had fantastic relief, essentially complete relief, she understands that if she does have a recurrence of discomfort we will proceed with an additional right L3-L4 transforaminal epidural, but the soonest we could do this would be July of this year. Continue gabapentin and tramadol for now.

## 2023-04-14 ENCOUNTER — Encounter: Payer: Self-pay | Admitting: Family Medicine

## 2023-04-14 ENCOUNTER — Ambulatory Visit (INDEPENDENT_AMBULATORY_CARE_PROVIDER_SITE_OTHER): Payer: Medicare HMO | Admitting: Family Medicine

## 2023-04-14 VITALS — BP 129/72 | HR 80 | Ht 67.0 in | Wt 162.0 lb

## 2023-04-14 DIAGNOSIS — R238 Other skin changes: Secondary | ICD-10-CM | POA: Diagnosis not present

## 2023-04-14 MED ORDER — VALACYCLOVIR HCL 1 G PO TABS: 1000.0000 mg | ORAL_TABLET | Freq: Two times a day (BID) | ORAL | 0 refills | Status: AC

## 2023-04-14 NOTE — Patient Instructions (Signed)
Start valtrex 1000mg  twice per day.  Let me know if symptoms worsen

## 2023-04-14 NOTE — Assessment & Plan Note (Signed)
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  She has had shingles in the past.  She does have history of immunosuppression for treatment of her RA.  Treat with Valtrex 10 days.  Instructed to contact clinic if having new or worsening symptoms.

## 2023-04-14 NOTE — Progress Notes (Signed)
Melody Moore - 72 y.o. female MRN [id number]  Date of birth: [birth year]  Subjective Chief Complaint  Patient presents with   Herpes Zoster    HPI Melody Moore is a 72 year old female here today with complaint of rash.  She has rash on her buttock.  This is a small circular patch.  She thinks may have shingles.  Area is somewhat painful.  There are blisters present.  She denies any itching or drainage.  Has not tried any thing so far for treatment.  ROS:  A comprehensive ROS was completed and negative except as noted per HPI  Allergies  Allergen Reactions   Bee Venom Anaphylaxis   Methotrexate Derivatives Other (See Comments)    Acute kidney injury   Oxycodone Other (See Comments)    Adrenal crisis while taking both prednisone and oxycodone    Prednisone     Adrenal crisis while taking both prednisone and oxycodone    Verapamil Hives   Latex Rash   Nickel Rash   Nitrofuran Derivatives Rash   Sulfasalazine Diarrhea and Nausea And Vomiting    Past Medical History:  Diagnosis Date   Adrenal crisis (HCC) 02/12/2016   AKI (acute kidney injury) (HCC) 02/2016   Allergy    Arthritis    Carotid stenosis, asymptomatic, bilateral 05/26/2017   Dopplers 02/12/2016: right - mid-distal 40-59% stenosis; left - 40-59% stenosis   Cataract    CKD (chronic kidney disease) stage 3, GFR 30-59 ml/min (HCC) 11/02/2017   Decreased calculated GFR 06/15/2017   Gallstones 06/22/2017   History of echocardiogram 11/07/2017   04/09/2016: EF 66%, mild diastolic dysfunction, trivial MR and TR   Hypertension    Hypothyroidism    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   Rheumatoid arthritis (HCC)    Rheumatoid arthritis involving multiple sites (HCC) 05/26/2017   Transaminitis 06/15/2017   Normal abdominal US 06/22/17    Past Surgical History:  Procedure Laterality Date   APPENDECTOMY     CATARACT EXTRACTION, BILATERAL  2015   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   TOTAL KNEE  ARTHROPLASTY Right 12/16/2017   Procedure: RIGHT TOTAL KNEE ARTHROPLASTY;  Surgeon: Jodi Geralds, MD;  Location: [facility];  Service: Orthopedics;  Laterality: Right;  Adductor Block    Social History   Socioeconomic History   Marital status: Married    Spouse name: Melody Moore   Number of children: 0   Years of education: 16   Highest education level: Bachelor's degree (e.g., BA, AB, BS)  Occupational History   Occupation: Production assistant, radio [employer]    Comment: retired  Tobacco Use   Smoking status: Former    Current packs/day: 0.00    Types: Cigarettes    Quit date: 02/07/1974    Years since quitting: 49.2   Smokeless tobacco: Never  Vaping Use   Vaping status: Never Used  Substance and Sexual Activity   Alcohol use: Yes    Comment: occasionally   Drug use: No   Sexual activity: Yes    Partners: Male    Birth control/protection: Post-menopausal  Other Topics Concern   Not on file  Social History Narrative   06/27/2021 - recently married in 2020 - originally from [place]       Lives with her husband. She enjoys walking and making wreaths.   Social Determinants of Health   Financial Resource Strain: Low Risk  (06/28/2022)   Overall Financial Resource Strain (CARDIA)    Difficulty of Paying Living Expenses: Not hard at  all  Food Insecurity: No Food Insecurity (06/28/2022)   Hunger Vital Sign    Worried About Running Out of Food in the Last Year: Never true    Ran Out of Food in the Last Year: Never true  Transportation Needs: No Transportation Needs (06/27/2021)   PRAPARE - Administrator, Civil Service (Medical): No    Lack of Transportation (Non-Medical): No  Physical Activity: Sufficiently Active (06/28/2022)   Exercise Vital Sign    Days of Exercise per Week: 7 days    Minutes of Exercise per Session: 50 min  Stress: No Stress Concern Present (06/28/2022)   [employer] of Occupational Health - Occupational Stress Questionnaire    Feeling of  Stress : Not at all  Social Connections: Socially Integrated (06/28/2022)   Social Connection and Isolation Panel [NHANES]    Frequency of Communication with Friends and Family: More than three times a week    Frequency of Social Gatherings with Friends and Family: Three times a week    Attends Religious Services: More than 4 times per year    Active Member of Clubs or Organizations: Yes    Attends Banker Meetings: More than 4 times per year    Marital Status: Married    Family History  Problem Relation Age of Onset   Diabetes Mother    Lung cancer Father    Hypertension Father    Uterine cancer Sister     Health Maintenance  Topic Date Due   Pneumonia Vaccine 35+ Years old (1 of 1 - PCV) 06/29/2023 (Originally 02/07/2014)   COVID-19 Vaccine (6 - 2023-24 season) 07/31/2023 (Originally 12/02/2022)   INFLUENZA VACCINE  05/05/2023   Medicare Annual Wellness (AWV)  08/12/2023   MAMMOGRAM  08/11/2024   DTaP/Tdap/Td (2 - Td or Tdap) 03/25/2027   Colonoscopy  04/04/2027   DEXA SCAN  Completed   Hepatitis C Screening  Completed   Zoster Vaccines- Shingrix  Completed   HPV VACCINES  Aged Out     ----------------------------------------------------------------------------------------------------------------------------------------------------------------------------------------------------------------- Physical Exam BP 129/72 (BP Location: Left Arm, Patient Position: Sitting, Cuff Size: Normal)   Pulse 80   Ht 5\' 7"  (1.702 m)   Wt 162 lb (73.5 kg)   SpO2 97%   BMI 25.37 kg/m   Physical Exam Constitutional:      Appearance: Normal appearance.  Skin:    Comments: Small vesicular rash located on buttock.  Circular shape.  Neurological:     Mental Status: She is alert.  Psychiatric:        Mood and Affect: Mood normal.        Behavior: Behavior normal.      ------------------------------------------------------------------------------------------------------------------------------------------------------------------------------------------------------------------- Assessment and Plan  Vesicular rash Compare this to be more of HSV or shingles as well.  She has had shingles in the past.  She does have history of immunosuppression for treatment of her RA.  Treat with Valtrex 10 days.  Instructed to contact clinic if having new or worsening symptoms.   Meds ordered this encounter  Medications   valACYclovir (VALTREX) 1000 MG tablet    Sig: Take 1 tablet (1,000 mg total) by mouth 2 (two) times daily for 10 days.    Dispense:  20 tablet    Refill:  0    No follow-ups on file.    This visit occurred during the SARS-CoV-2 public health emergency.  Safety protocols were in place, including screening questions prior to the visit, additional usage of staff PPE, and extensive cleaning  of exam room while observing appropriate contact time as indicated for disinfecting solutions.

## 2023-04-23 ENCOUNTER — Other Ambulatory Visit: Payer: Self-pay | Admitting: Family Medicine

## 2023-04-28 DIAGNOSIS — M069 Rheumatoid arthritis, unspecified: Secondary | ICD-10-CM | POA: Diagnosis not present

## 2023-05-20 ENCOUNTER — Ambulatory Visit (INDEPENDENT_AMBULATORY_CARE_PROVIDER_SITE_OTHER): Payer: Medicare HMO | Admitting: Sports Medicine

## 2023-05-20 DIAGNOSIS — L659 Nonscarring hair loss, unspecified: Secondary | ICD-10-CM

## 2023-05-20 DIAGNOSIS — M5416 Radiculopathy, lumbar region: Secondary | ICD-10-CM

## 2023-05-20 MED ORDER — FOLIC ACID 400 MCG PO TABS: 400.0000 ug | ORAL_TABLET | Freq: Every day | ORAL | 3 refills | Status: AC
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
## 2023-05-20 NOTE — Assessment & Plan Note (Addendum)
Melody Moore has rheumatoid arthritis, historically she has been on methotrexate with leucovorin as a folate analog. She is no longer on methotrexate, so I think rather than using leucovorin we can simply have her use folic acid, she can follow this up with her PCP. In addition her rheumatologist asked her to discontinue leucovorin. She may also consider biotin in the future but she can discuss this with her PCP.

## 2023-05-23 ENCOUNTER — Encounter: Payer: Self-pay | Admitting: Sports Medicine

## 2023-05-26 DIAGNOSIS — Z79899 Other long term (current) drug therapy: Secondary | ICD-10-CM | POA: Diagnosis not present

## 2023-05-26 DIAGNOSIS — M069 Rheumatoid arthritis, unspecified: Secondary | ICD-10-CM | POA: Diagnosis not present

## 2023-05-29 ENCOUNTER — Other Ambulatory Visit: Payer: Self-pay | Admitting: Family Medicine

## 2023-06-01 NOTE — Discharge Instructions (Signed)

## 2023-06-02 ENCOUNTER — Ambulatory Visit
Admission: RE | Admit: 2023-06-02 | Discharge: 2023-06-02 | Disposition: A | Discharge status: Home / Self Care | Payer: Medicare HMO | Source: Ambulatory Visit | Attending: Sports Medicine | Admitting: Sports Medicine

## 2023-06-02 DIAGNOSIS — M4727 Other spondylosis with radiculopathy, lumbosacral region: Secondary | ICD-10-CM | POA: Diagnosis not present

## 2023-06-02 DIAGNOSIS — M5416 Radiculopathy, lumbar region: Secondary | ICD-10-CM

## 2023-06-02 MED ORDER — IOPAMIDOL (ISOVUE-M 200) INJECTION 41%
1.0000 mL | Freq: Once | INTRAMUSCULAR | Status: AC
  Administered 2023-06-02: 1 mL via EPIDURAL

## 2023-06-02 MED ORDER — METHYLPREDNISOLONE ACETATE 40 MG/ML INJ SUSP (RADIOLOG
80.0000 mg | Freq: Once | INTRAMUSCULAR | Status: AC
  Administered 2023-06-02: 80 mg via EPIDURAL

## 2023-06-08 ENCOUNTER — Other Ambulatory Visit: Payer: Self-pay | Admitting: Family Medicine

## 2023-06-08 DIAGNOSIS — I6523 Occlusion and stenosis of bilateral carotid arteries: Secondary | ICD-10-CM

## 2023-06-17 ENCOUNTER — Other Ambulatory Visit: Payer: Self-pay | Admitting: Family Medicine

## 2023-07-04 ENCOUNTER — Ambulatory Visit (INDEPENDENT_AMBULATORY_CARE_PROVIDER_SITE_OTHER): Payer: Medicare HMO | Admitting: Family Medicine

## 2023-07-04 VITALS — BP 157/77 | HR 65 | Temp 97.7°F | Ht 66.0 in | Wt 157.0 lb

## 2023-07-04 DIAGNOSIS — Z0000 Encounter for general adult medical examination without abnormal findings: Secondary | ICD-10-CM | POA: Diagnosis not present

## 2023-07-04 DIAGNOSIS — Z1231 Encounter for screening mammogram for malignant neoplasm of breast: Secondary | ICD-10-CM

## 2023-07-04 NOTE — Progress Notes (Signed)
MEDICARE ANNUAL WELLNESS VISIT  07/04/2023  Telephone Visit Disclaimer This Medicare AWV was conducted by telephone due to national recommendations for restrictions regarding the COVID-19 Pandemic (e.g. social distancing).  I verified, using two identifiers, that I am speaking with Melody Moore or their authorized healthcare agent. I discussed the limitations, risks, security, and privacy concerns of performing an evaluation and management service by telephone and the potential availability of an in-person appointment in the future. The patient expressed understanding and agreed to proceed.  Location of Patient: Home Location of Provider (nurse):  In the office.  Subjective:    Melody Moore is a 72 y.o. female patient of Melody Coombe, DO who had a Medicare Annual Wellness Visit today via telephone. Melody Moore is Retired and lives with their spouse. she does not have any children. she reports that she is socially active and does interact with friends/family regularly. she is moderately physically active and enjoys walking and making wreaths.  Patient Care Team: Melody Coombe, DO as PCP - General (Family Medicine) Windy Carina, PA-C (Inactive) as Consulting Physician (Rheumatology)     07/04/2023    8:07 AM 06/28/2022    8:15 AM 03/18/2022    9:37 AM 06/27/2021    8:52 AM 03/24/2020   10:02 AM 12/19/2018    8:23 AM 06/09/2018    9:40 AM  Advanced Directives  Does Patient Have a Medical Advance Directive? Yes Yes Yes Yes Yes Yes Yes  Type of Advance Directive Living will Living will Healthcare Power of [place];Living will Healthcare Power of [place];Living will Healthcare Power of [employer] of [place];Living will Healthcare Power of [place];Living will  Does patient want to make changes to medical advance directive? No - Patient declined No - Patient declined No - Patient declined No - Patient declined Yes (MAU/Ambulatory/Procedural Areas - Information given) No -  Patient declined   Copy of Healthcare Power of Attorney in Chart?   Yes - validated most recent copy scanned in chart (See row information) Yes - validated most recent copy scanned in chart (See row information) Yes - validated most recent copy scanned in chart (See row information) Yes - validated most recent copy scanned in chart (See row information) Yes    Hospital Utilization Over the Past 12 Months: # of hospitalizations or ER visits: 0 # of surgeries: 0  Review of Systems    Patient reports that her overall health is better compared to last year.  History obtained from chart review and the patient  Patient Reported Readings (BP, Pulse, CBG, Weight, etc) BP: 157/77 Pulse: 65 Temp: 97.7 Weight: 157 lb Height: 71f6  Pain Assessment Pain : No/denies pain     Current Medications & Allergies (verified) Allergies as of 07/04/2023       Reactions   Bee Venom Anaphylaxis   Methotrexate Derivatives Other (See Comments)   Acute kidney injury   Oxycodone Other (See Comments)   Adrenal crisis while taking both prednisone and oxycodone    Prednisone    Adrenal crisis while taking both prednisone and oxycodone    Verapamil Hives   Latex Rash   Nickel Rash   Nitrofuran Derivatives Rash   Sulfasalazine Diarrhea, Nausea And Vomiting        Medication List        Accurate as of July 04, 2023  8:22 AM. If you have any questions, ask your nurse or doctor.          STOP taking these medications  albuterol 108 (90 Base) MCG/ACT inhaler Commonly known as: VENTOLIN HFA       TAKE these medications    acetaminophen 650 MG CR tablet Commonly known as: TYLENOL Take 2 tablets (1,300 mg total) by mouth 2 (two) times daily as needed for pain.   amLODipine 10 MG tablet Commonly known as: NORVASC TAKE 1 TABLET BY MOUTH EVERY DAY   aspirin EC 81 MG tablet Take 81 mg by mouth daily.   atorvastatin 40 MG tablet Commonly known as: LIPITOR TAKE 1 TABLET BY MOUTH  EVERY DAY   CALCIUM 600 + D PO Take 2 tablets by mouth daily.   docusate sodium 100 MG capsule Commonly known as: COLACE Take 100 mg by mouth daily.   EPINEPHrine 0.3 mg/0.3 mL Soaj injection Commonly known as: EPI-PEN INJECT 0.3 MLS (0.3 MG TOTAL) INTO THE MUSCLE ONCE AS NEEDED FOR UP TO 1 DOSE.   ferrous sulfate 325 (65 FE) MG tablet Take 325 mg by mouth daily with breakfast.   fluticasone 50 MCG/ACT nasal spray Commonly known as: FLONASE Place 2 sprays into both nostrils daily. SPRAY 2 SPRAYS INTO EACH NOSTRIL EVERY DAY   folic acid 400 MCG tablet Commonly known as: FOLVITE Take 1 tablet (400 mcg total) by mouth daily.   gabapentin 300 MG capsule Commonly known as: NEURONTIN TAKE 1 CAPSULE BY MOUTH EVERYDAY AT BEDTIME   hydroxychloroquine 200 MG tablet Commonly known as: PLAQUENIL Take 200 mg by mouth 2 (two) times daily.   leflunomide 20 MG tablet Commonly known as: ARAVA Take 1 tablet by mouth daily.   levothyroxine 50 MCG tablet Commonly known as: SYNTHROID TAKE 1 TABLET BY MOUTH EVERY DAY BEFORE BREAKFAST   lisinopril 20 MG tablet Commonly known as: ZESTRIL TAKE 1 TABLET BY MOUTH EVERY DAY   traMADol 50 MG tablet Commonly known as: ULTRAM Take 1 tablet (50 mg total) by mouth every 12 (twelve) hours as needed.   traZODone 50 MG tablet Commonly known as: DESYREL TAKE 1 TO 2 TABLETS BY MOUTH AT BEDTIME AS NEEDED   triamcinolone ointment 0.5 % Commonly known as: KENALOG Apply 1 application topically daily as needed.   Turmeric 500 MG Caps Take by mouth daily.   Vitamin D 50 MCG (2000 UT) tablet Take 2,000 Units by mouth daily.        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   AKI (acute kidney injury) (HCC) 02/2016   Allergy    Anemia Yrs   Arthritis    Carotid stenosis, asymptomatic, bilateral 05/26/2017   Dopplers 02/12/2016: right - mid-distal 40-59% stenosis; left - 40-59% stenosis    Cataract    CKD (chronic kidney disease) stage 3, GFR 30-59 ml/min (HCC) 11/02/2017   Decreased calculated GFR 06/15/2017   Gallstones 06/22/2017   History of echocardiogram 11/07/2017   04/09/2016: EF 66%, mild diastolic dysfunction, trivial MR and TR   Hypertension    Hypothyroidism    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   Rheumatoid arthritis (HCC)    Rheumatoid arthritis involving multiple sites (HCC) 05/26/2017   Transaminitis 06/15/2017   Normal abdominal US 06/22/17   Past Surgical History:  Procedure Laterality Date   APPENDECTOMY     CATARACT EXTRACTION, BILATERAL  2015   JOINT REPLACEMENT  2018 2019   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   TOTAL KNEE ARTHROPLASTY Right 12/16/2017   Procedure: RIGHT TOTAL KNEE ARTHROPLASTY;  Surgeon: Jodi Geralds, MD;  Location: [facility];  Service: Orthopedics;  Laterality: Right;  Adductor Block   Family History  Problem Relation Age of Onset   Diabetes Mother    Arthritis Mother    Lung cancer Father    Hypertension Father    Uterine cancer Sister    Social History   Socioeconomic History   Marital status: Married    Spouse name: [place]   Number of children: 0   Years of education: 16   Highest education level: Associate degree: academic program  Occupational History   Occupation: [employer]    Comment: retired  Tobacco Use   Smoking status: Former    Current packs/day: 0.00    Types: Cigarettes    Quit date: 02/07/1974    Years since quitting: 49.4   Smokeless tobacco: Never  Vaping Use   Vaping status: Never Used  Substance and Sexual Activity   Alcohol use: Yes    Alcohol/week: 1.0 standard drink of alcohol    Types: 1 Standard drinks or equivalent per week    Comment: Once in a while   Drug use: No   Sexual activity: Yes    Partners: Male    Birth control/protection: Post-menopausal  Other Topics Concern   Not on file  Social History Narrative   06/27/2021 -  recently married in 2020 - originally from [place]       Lives with her husband. She enjoys walking and making wreaths.   Social Determinants of Health   Financial Resource Strain: Medium Risk (06/28/2023)   Overall Financial Resource Strain (CARDIA)    Difficulty of Paying Living Expenses: Somewhat hard  Food Insecurity: Food Insecurity Present (06/28/2023)   Hunger Vital Sign    Worried About Running Out of Food in the Last Year: Sometimes true    Ran Out of Food in the Last Year: Patient declined  Transportation Needs: No Transportation Needs (06/28/2023)   PRAPARE - Administrator, Civil Service (Medical): No    Lack of Transportation (Non-Medical): No  Physical Activity: Sufficiently Active (06/28/2023)   Exercise Vital Sign    Days of Exercise per Week: 7 days    Minutes of Exercise per Session: 30 min  Stress: No Stress Concern Present (06/28/2023)   [employer] of Occupational Health - Occupational Stress Questionnaire    Feeling of Stress : Not at all  Social Connections: Socially Integrated (07/04/2023)   Social Connection and Isolation Panel [NHANES]    Frequency of Communication with Friends and Family: More than three times a week    Frequency of Social Gatherings with Friends and Family: More than three times a week    Attends Religious Services: More than 4 times per year    Active Member of [employer] or Organizations: Yes    Attends Banker Meetings: 1 to 4 times per year    Marital Status: Married    Activities of Daily Living    06/28/2023    6:09 AM  In your present state of health, do you have any difficulty performing the following activities:  Hearing? 0  Vision? 0  Difficulty concentrating or making decisions? 0  Walking or climbing stairs? 0  Dressing or bathing? 0  Doing errands, shopping? 0  Preparing Food and eating ? N  Using the Toilet? N  In the past six months, have you accidently leaked urine? N  Do you have problems  with loss of bowel control? N  Managing your Medications? N  Managing your Finances? N  Housekeeping or managing your Housekeeping? N    Patient Education/ Literacy How often do you need to have someone help you when you read instructions, pamphlets, or other written materials from your doctor or pharmacy?: 1 - Never What is the last grade level you completed in school?: Associates degree  Exercise    Diet Patient reports consuming 3 meals a day and 0-1 snack(s) a day Patient reports that her primary diet is: Regular Patient reports that she does have regular access to food.   Depression Screen    07/04/2023    8:08 AM 11/09/2022   11:45 AM 07/06/2022    8:21 AM 06/28/2022    8:16 AM 06/27/2021    8:54 AM 06/27/2021    8:49 AM 12/31/2020    8:15 AM  PHQ 2/9 Scores  PHQ - 2 Score 0 0 0 0 0 0 0     Fall Risk    07/04/2023    8:07 AM 06/28/2023    6:09 AM 04/14/2023   11:01 AM 01/11/2023    8:21 AM 11/09/2022   11:45 AM  Fall Risk   Falls in the past year? 1 1 1  0 1  Number falls in past yr: 1 1 1  0 1  Injury with Fall? 0 0 0 0 1  Risk for fall due to : History of fall(s)  History of fall(s) No Fall Risks Impaired balance/gait  Follow up Falls evaluation completed;Education provided;Falls prevention discussed  Falls evaluation completed Falls evaluation completed Falls evaluation completed;Education provided     Objective:  Melody Moore seemed alert and oriented and she participated appropriately during our telephone visit.  Blood Pressure Weight BMI  BP Readings from Last 3 Encounters:  07/04/23 (!) 157/77  06/02/23 (!) 152/84  04/14/23 129/72   Wt Readings from Last 3 Encounters:  07/04/23 157 lb (71.2 kg)  04/14/23 162 lb (73.5 kg)  01/11/23 163 lb (73.9 kg)   BMI Readings from Last 1 Encounters:  07/04/23 25.34 kg/m    *Unable to obtain current vital signs, weight, and BMI due to telephone visit type  Hearing/Vision  Melody Moore did not seem to have difficulty  with hearing/understanding during the telephone conversation Reports that she has had a formal eye exam by an eye care professional within the past year Reports that she has not had a formal hearing evaluation within the past year *Unable to fully assess hearing and vision during telephone visit type  Cognitive Function:    07/04/2023    8:11 AM 06/28/2022    8:19 AM 06/27/2021    8:55 AM 03/24/2020   10:05 AM 12/19/2018    8:31 AM  6CIT Screen  What Year? 0 points 0 points 0 points 0 points 0 points  What month? 0 points 0 points 0 points 0 points 0 points  What time? 0 points 0 points 0 points 0 points 0 points  Count back from 20 0 points 0 points 0 points 0 points 2 points  Months in reverse 0 points 0 points  0 points 0 points  Repeat phrase 0 points 0 points  0 points 0 points  Total Score 0 points 0 points  0 points 2 points   (Normal:0-7, Significant for Dysfunction: >8)  Normal Cognitive Function Screening: Yes   Immunization & Health Maintenance Record Immunization History  Administered Date(s) Administered   Fluad Quad(high Dose 65+) 07/03/2020, 09/07/2021, 10/07/2022   Influenza, High Dose Seasonal PF 05/26/2017   Moderna  Covid-19 Fall Seasonal Vaccine 11yrs & older 10/07/2022   PFIZER(Purple Top)SARS-COV-2 Vaccination 12/03/2019, 01/01/2020, 07/11/2020, 04/02/2021, 10/07/2022   Tdap 03/24/2017   Zoster Recombinant(Shingrix) 04/02/2021, 10/07/2022    Health Maintenance  Topic Date Due   Pneumonia Vaccine 51+ Years old (1 of 1 - PCV) 07/06/2023 (Originally 02/07/2014)   COVID-19 Vaccine (6 - 2023-24 season) 07/20/2023 (Originally 06/05/2023)   INFLUENZA VACCINE  01/02/2024 (Originally 05/05/2023)   DEXA SCAN  04/22/2024   Medicare Annual Wellness (AWV)  07/03/2024   MAMMOGRAM  08/11/2024   DTaP/Tdap/Td (2 - Td or Tdap) 03/25/2027   Colonoscopy  04/04/2027   Hepatitis C Screening  Completed   Zoster Vaccines- Shingrix  Completed   HPV VACCINES  Aged Out        Assessment  This is a routine wellness examination for Melody Moore.  Health Maintenance: Due or Overdue There are no preventive care reminders to display for this patient.   Melody Moore does not need a referral for Community Assistance: Care Management:   no Social Work:    no Prescription Assistance:  no Nutrition/Diabetes Education:  no   Plan:  Personalized Goals  Goals Addressed               This Visit's Progress     Patient Stated (pt-stated)        Patient stated she would like to maintain her weight loss.       Personalized Health Maintenance & Screening Recommendations  Pneumococcal vaccine  Influenza vaccine Screening mammography - due in November.   Lung Cancer Screening Recommended: no (Low Dose CT Chest recommended if Age 21-80 years, 20 pack-year currently smoking OR have quit w/in past 15 years) Hepatitis C Screening recommended: no HIV Screening recommended: no  Advanced Directives: Written information was not prepared per patient's request.  Referrals & Orders Orders Placed This Encounter  Procedures   Mammogram 3D SCREEN BREAST BILATERAL    Follow-up Plan Follow-up with Melody Coombe, DO as planned Schedule mammogram for after 08/12/2023 Influenza and pneumonia vaccine can be completed when you come in for your next in office visit. Medicare wellness visit in one year.  Patient will access AVS on my chart.   I have personally reviewed and noted the following in the patient's chart:   Medical and social history Use of alcohol, tobacco or illicit drugs  Current medications and supplements Functional ability and status Nutritional status Physical activity Advanced directives List of other physicians Hospitalizations, surgeries, and ER visits in previous 12 months Vitals Screenings to include cognitive, depression, and falls Referrals and appointments  In addition, I have reviewed and discussed with Melody Moore  certain preventive protocols, quality metrics, and best practice recommendations. A written personalized care plan for preventive services as well as general preventive health recommendations is available and can be mailed to the patient at her request.      Modesto Charon, RN BSN  07/04/2023

## 2023-07-04 NOTE — Patient Instructions (Addendum)
MEDICARE ANNUAL WELLNESS VISIT Health Maintenance Summary and Written Plan of Care  [name] ,  Thank you for allowing me to perform your Medicare Annual Wellness Visit and for your ongoing commitment to your health.   Health Maintenance & Immunization History Health Maintenance  Topic Date Due  . Pneumonia Vaccine 8+ Years old (1 of 1 - PCV) 07/06/2023 (Originally 02/07/2014)  . COVID-19 Vaccine (6 - 2023-24 season) 07/20/2023 (Originally 06/05/2023)  . INFLUENZA VACCINE  01/02/2024 (Originally 05/05/2023)  . DEXA SCAN  04/22/2024  . Medicare Annual Wellness (AWV)  07/03/2024  . MAMMOGRAM  08/11/2024  . DTaP/Tdap/Td (2 - Td or Tdap) 03/25/2027  . Colonoscopy  04/04/2027  . Hepatitis C Screening  Completed  . Zoster Vaccines- Shingrix  Completed  . HPV VACCINES  Aged Out   Immunization History  Administered Date(s) Administered  . Fluad Quad(high Dose 65+) 07/03/2020, 09/07/2021, 10/07/2022  . Influenza, High Dose Seasonal PF 05/26/2017  . Moderna Covid-19 Fall Seasonal Vaccine 9yrs & older 10/07/2022  . PFIZER(Purple Top)SARS-COV-2 Vaccination 12/03/2019, 01/01/2020, 07/11/2020, 04/02/2021, 10/07/2022  . Tdap 03/24/2017  . Zoster Recombinant(Shingrix) 04/02/2021, 10/07/2022    These are the patient goals that we discussed:  Goals Addressed              This Visit's Progress   .  Patient Stated (pt-stated)        Patient stated she would like to maintain her weight loss.        This is a list of Health Maintenance Items that are overdue or due now: Pneumococcal vaccine  Influenza vaccine Screening mammography - due in November.   Orders/Referrals Placed Today: Orders Placed This Encounter  Procedures  . Mammogram 3D SCREEN BREAST BILATERAL    Standing Status:   Future    Standing Expiration Date:   07/03/2024    Scheduling Instructions:     Please call patient to schedule. She is due after 08/12/2023.    Order Specific Question:   Reason for Exam (SYMPTOM  OR  DIAGNOSIS REQUIRED)    Answer:   screening for breast cancer    Order Specific Question:   Preferred imaging location?    Answer:   [facility]    (Contact our referral department at [phone] if you have not spoken with someone about your referral appointment within the next 5 days)    Follow-up Plan Follow-up with Everrett Coombe, DO as planned Schedule mammogram for after 08/12/2023 Influenza and pneumonia vaccine can be completed when you come in for your next in office visit. Medicare wellness visit in one year.  Patient will access AVS on my chart.      Health Maintenance, Female Adopting a healthy lifestyle and getting preventive care are important in promoting health and wellness. Ask your health care provider about: The right schedule for you to have regular tests and exams. Things you can do on your own to prevent diseases and keep yourself healthy. What should I know about diet, weight, and exercise? Eat a healthy diet  Eat a diet that includes plenty of vegetables, fruits, low-fat dairy products, and lean protein. Do not eat a lot of foods that are high in solid fats, added sugars, or sodium. Maintain a healthy weight Body mass index (BMI) is used to identify weight problems. It estimates body fat based on height and weight. Your health care provider can help determine your BMI and help you achieve or maintain a healthy weight. Get regular exercise Get regular exercise.  This is one of the most important things you can do for your health. Most adults should: Exercise for at least 150 minutes each week. The exercise should increase your heart rate and make you sweat (moderate-intensity exercise). Do strengthening exercises at least twice a week. This is in addition to the moderate-intensity exercise. Spend less time sitting. Even light physical activity can be beneficial. Watch cholesterol and blood lipids Have your blood tested for lipids and cholesterol at  75 years of age, then have this test every 5 years. Have your cholesterol levels checked more often if: Your lipid or cholesterol levels are high. You are older than 74 years of age. You are at high risk for heart disease. What should I know about cancer screening? Depending on your health history and family history, you may need to have cancer screening at various ages. This may include screening for: Breast cancer. Cervical cancer. Colorectal cancer. Skin cancer. Lung cancer. What should I know about heart disease, diabetes, and high blood pressure? Blood pressure and heart disease High blood pressure causes heart disease and increases the risk of stroke. This is more likely to develop in people who have high blood pressure readings or are overweight. Have your blood pressure checked: Every 3-5 years if you are 24-52 years of age. Every year if you are 71 years old or older. Diabetes Have regular diabetes screenings. This checks your fasting blood sugar level. Have the screening done: Once every three years after age 61 if you are at a normal weight and have a low risk for diabetes. More often and at a younger age if you are overweight or have a high risk for diabetes. What should I know about preventing infection? Hepatitis B If you have a higher risk for hepatitis B, you should be screened for this virus. Talk with your health care provider to find out if you are at risk for hepatitis B infection. Hepatitis C Testing is recommended for: Everyone born from [birth year] through 1967. Anyone with known risk factors for hepatitis C. Sexually transmitted infections (STIs) Get screened for STIs, including gonorrhea and chlamydia, if: You are sexually active and are younger than 74 years of age. You are older than 74 years of age and your health care provider tells you that you are at risk for this type of infection. Your sexual activity has changed since you were last screened, and you are at  increased risk for chlamydia or gonorrhea. Ask your health care provider if you are at risk. Ask your health care provider about whether you are at high risk for HIV. Your health care provider may recommend a prescription medicine to help prevent HIV infection. If you choose to take medicine to prevent HIV, you should first get tested for HIV. You should then be tested every 3 months for as long as you are taking the medicine. Pregnancy If you are about to stop having your period (premenopausal) and you may become pregnant, seek counseling before you get pregnant. Take 400 to 800 micrograms (mcg) of folic acid every day if you become pregnant. Ask for birth control (contraception) if you want to prevent pregnancy. Osteoporosis and menopause Osteoporosis is a disease in which the bones lose minerals and strength with aging. This can result in bone fractures. If you are 41 years old or older, or if you are at risk for osteoporosis and fractures, ask your health care provider if you should: Be screened for bone loss. Take a calcium or vitamin  D supplement to lower your risk of fractures. Be given hormone replacement therapy (HRT) to treat symptoms of menopause. Follow these instructions at home: Alcohol use Do not drink alcohol if: Your health care provider tells you not to drink. You are pregnant, may be pregnant, or are planning to become pregnant. If you drink alcohol: Limit how much you have to: 0-1 drink a day. Know how much alcohol is in your drink. In the [place] one drink equals one 12 oz bottle of beer (355 mL), one 5 oz glass of wine (148 mL), or one 1 oz glass of hard liquor (44 mL). Lifestyle Do not use any products that contain nicotine or tobacco. These products include cigarettes, chewing tobacco, and vaping devices, such as e-cigarettes. If you need help quitting, ask your health care provider. Do not use street drugs. Do not share needles. Ask your health care provider for help  if you need support or information about quitting drugs. General instructions Schedule regular health, dental, and eye exams. Stay current with your vaccines. Tell your health care provider if: You often feel depressed. You have ever been abused or do not feel safe at home. Summary Adopting a healthy lifestyle and getting preventive care are important in promoting health and wellness. Follow your health care provider's instructions about healthy diet, exercising, and getting tested or screened for diseases. Follow your health care provider's instructions on monitoring your cholesterol and blood pressure. This information is not intended to replace advice given to you by your health care provider. Make sure you discuss any questions you have with your health care provider. Document Revised: 02/09/2021 Document Reviewed: 02/09/2021 Elsevier Patient Education  2024 [employer].

## 2023-07-12 ENCOUNTER — Telehealth: Payer: Medicare HMO | Admitting: Physician Assistant

## 2023-07-12 DIAGNOSIS — B029 Zoster without complications: Secondary | ICD-10-CM

## 2023-07-12 MED ORDER — VALACYCLOVIR HCL 1 G PO TABS: 1000.0000 mg | ORAL_TABLET | Freq: Three times a day (TID) | ORAL | 0 refills | Status: AC

## 2023-07-12 NOTE — Progress Notes (Signed)
I have spent 5 minutes in review of e-visit questionnaire, review and updating patient chart, medical decision making and response to patient.   Mia Milan Cody Jacklynn Dehaas, PA-C    

## 2023-07-12 NOTE — Progress Notes (Signed)
E-visit for Shingles   We are sorry that you are not feeling well. Here is how we plan to help!  Based on what you shared with me it looks like you have shingles.  Shingles or herpes zoster, is a common infection of the nerves.  It is a painful rash caused by the herpes zoster virus.  This is the same virus that causes chickenpox.  After a person has chickenpox, the virus remains inactive in the nerve cells.  Years later, the virus can become active again and travel to the skin.  It typically will appear on one side of the face or body.  Burning or shooting pain, tingling, or itching are early signs of the infection.  Blisters typically scab over in 7 to 10 days and clear up within 2-4 weeks. Shingles is only contagious to people that have never had the chickenpox, the chickenpox vaccine, or anyone who has a compromised immune system.  You should avoid contact with these type of people until your blisters scab over.  I have prescribed Valacyclovir 1g three times daily for 7 days   HOME CARE: Apply ice packs (wrapped in a thin towel), cool compresses, or soak in cool bath to help reduce pain. Use calamine lotion to calm itchy skin. Avoid scratching the rash. Avoid direct sunlight.  GET HELP RIGHT AWAY IF: Symptoms that don't away after treatment. A rash or blisters near your eye. Increased drainage, fever, or rash after treatment. Severe pain that doesn't go away.   MAKE SURE YOU   Understand these instructions. Will watch your condition. Will get help right away if you are not doing well or get worse.  Thank you for choosing an e-visit.  Your e-visit answers were reviewed by a board certified advanced clinical practitioner to complete your personal care plan. Depending upon the condition, your plan could have included both over the counter or prescription medications.  Please review your pharmacy choice. Make sure the pharmacy is open so you can pick up prescription now. If there is  a problem, you may contact your provider through MyChart messaging and have the prescription routed to another pharmacy.  Your safety is important to us. If you have drug allergies check your prescription carefully.   For the next 24 hours you can use MyChart to ask questions about today's visit, request a non-urgent call back, or ask for a work or school excuse. You will get an email in the next two days asking about your experience. I hope that your e-visit has been valuable and will speed your recovery.  

## 2023-07-13 ENCOUNTER — Encounter: Payer: Medicare HMO | Admitting: Family Medicine

## 2023-07-26 ENCOUNTER — Encounter: Payer: Medicare HMO | Admitting: Family Medicine

## 2023-08-04 ENCOUNTER — Ambulatory Visit (INDEPENDENT_AMBULATORY_CARE_PROVIDER_SITE_OTHER): Payer: Medicare HMO | Admitting: Family Medicine

## 2023-08-04 ENCOUNTER — Encounter: Payer: Self-pay | Admitting: Family Medicine

## 2023-08-04 ENCOUNTER — Other Ambulatory Visit: Payer: Self-pay | Admitting: Family Medicine

## 2023-08-04 VITALS — BP 147/77 | HR 72 | Ht 66.0 in | Wt 160.0 lb

## 2023-08-04 DIAGNOSIS — R21 Rash and other nonspecific skin eruption: Secondary | ICD-10-CM | POA: Diagnosis not present

## 2023-08-04 DIAGNOSIS — Z23 Encounter for immunization: Secondary | ICD-10-CM | POA: Diagnosis not present

## 2023-08-04 DIAGNOSIS — M069 Rheumatoid arthritis, unspecified: Secondary | ICD-10-CM

## 2023-08-04 DIAGNOSIS — E559 Vitamin D deficiency, unspecified: Secondary | ICD-10-CM

## 2023-08-04 DIAGNOSIS — I10 Essential (primary) hypertension: Secondary | ICD-10-CM | POA: Diagnosis not present

## 2023-08-04 DIAGNOSIS — E785 Hyperlipidemia, unspecified: Secondary | ICD-10-CM | POA: Diagnosis not present

## 2023-08-04 DIAGNOSIS — Z8639 Personal history of other endocrine, nutritional and metabolic disease: Secondary | ICD-10-CM

## 2023-08-04 DIAGNOSIS — D649 Anemia, unspecified: Secondary | ICD-10-CM | POA: Diagnosis not present

## 2023-08-04 DIAGNOSIS — Z0000 Encounter for general adult medical examination without abnormal findings: Secondary | ICD-10-CM

## 2023-08-04 HISTORY — DX: Encounter for general adult medical examination without abnormal findings: Z00.00

## 2023-08-04 MED ORDER — TRIAMCINOLONE ACETONIDE 0.5 % EX OINT: 1.0000 | TOPICAL_OINTMENT | Freq: Every day | CUTANEOUS | 1 refills | Status: DC | PRN

## 2023-08-04 NOTE — Patient Instructions (Signed)
Preventive Care 65 Years and Older, Female Preventive care refers to lifestyle choices and visits with your health care provider that can promote health and wellness. Preventive care visits are also called wellness exams. What can I expect for my preventive care visit? Counseling Your health care provider may ask you questions about your: Medical history, including: Past medical problems. Family medical history. Pregnancy and menstrual history. History of falls. Current health, including: Memory and ability to understand (cognition). Emotional well-being. Home life and relationship well-being. Sexual activity and sexual health. Lifestyle, including: Alcohol, nicotine or tobacco, and drug use. Access to firearms. Diet, exercise, and sleep habits. Work and work environment. Sunscreen use. Safety issues such as seatbelt and bike helmet use. Physical exam Your health care provider will check your: Height and weight. These may be used to calculate your BMI (body mass index). BMI is a measurement that tells if you are at a healthy weight. Waist circumference. This measures the distance around your waistline. This measurement also tells if you are at a healthy weight and may help predict your risk of certain diseases, such as type 2 diabetes and high blood pressure. Heart rate and blood pressure. Body temperature. Skin for abnormal spots. What immunizations do I need?  Vaccines are usually given at various ages, according to a schedule. Your health care provider will recommend vaccines for you based on your age, medical history, and lifestyle or other factors, such as travel or where you work. What tests do I need? Screening Your health care provider may recommend screening tests for certain conditions. This may include: Lipid and cholesterol levels. Hepatitis C test. Hepatitis B test. HIV (human immunodeficiency virus) test. STI (sexually transmitted infection) testing, if you are at  risk. Lung cancer screening. Colorectal cancer screening. Diabetes screening. This is done by checking your blood sugar (glucose) after you have not eaten for a while (fasting). Mammogram. Talk with your health care provider about how often you should have regular mammograms. BRCA-related cancer screening. This may be done if you have a family history of breast, ovarian, tubal, or peritoneal cancers. Bone density scan. This is done to screen for osteoporosis. Talk with your health care provider about your test results, treatment options, and if necessary, the need for more tests. Follow these instructions at home: Eating and drinking  Eat a diet that includes fresh fruits and vegetables, whole grains, lean protein, and low-fat dairy products. Limit your intake of foods with high amounts of sugar, saturated fats, and salt. Take vitamin and mineral supplements as recommended by your health care provider. Do not drink alcohol if your health care provider tells you not to drink. If you drink alcohol: Limit how much you have to 0-1 drink a day. Know how much alcohol is in your drink. In the [place] one drink equals one 12 oz bottle of beer (355 mL), one 5 oz glass of wine (148 mL), or one 1 oz glass of hard liquor (44 mL). Lifestyle Brush your teeth every morning and night with fluoride toothpaste. Floss one time each day. Exercise for at least 30 minutes 5 or more days each week. Do not use any products that contain nicotine or tobacco. These products include cigarettes, chewing tobacco, and vaping devices, such as e-cigarettes. If you need help quitting, ask your health care provider. Do not use drugs. If you are sexually active, practice safe sex. Use a condom or other form of protection in order to prevent STIs. Take aspirin only as told by   your health care provider. Make sure that you understand how much to take and what form to take. Work with your health care provider to find out whether it  is safe and beneficial for you to take aspirin daily. Ask your health care provider if you need to take a cholesterol-lowering medicine (statin). Find healthy ways to manage stress, such as: Meditation, yoga, or listening to music. Journaling. Talking to a trusted person. Spending time with friends and family. Minimize exposure to UV radiation to reduce your risk of skin cancer. Safety Always wear your seat belt while driving or riding in a vehicle. Do not drive: If you have been drinking alcohol. Do not ride with someone who has been drinking. When you are tired or distracted. While texting. If you have been using any mind-altering substances or drugs. Wear a helmet and other protective equipment during sports activities. If you have firearms in your house, make sure you follow all gun safety procedures. What's next? Visit your health care provider once a year for an annual wellness visit. Ask your health care provider how often you should have your eyes and teeth checked. Stay up to date on all vaccines. This information is not intended to replace advice given to you by your health care provider. Make sure you discuss any questions you have with your health care provider. Document Revised: 03/18/2021 Document Reviewed: 03/18/2021 Elsevier Patient Education  2024 [employer]  

## 2023-08-04 NOTE — Progress Notes (Signed)
Melody Moore - 72 y.o. female MRN [id number]  Date of birth: 1950/12/13  Subjective Chief Complaint  Patient presents with   Annual Exam   Insect Bite   Memory Loss    HPI Melody Moore is a 72 y.o. female here today for annual exam.   She reports that she is doing pretty well.  Has rash on her arms.  Got a new mattress topper that she thinks she may be allergic to.     She is doing well with current medications.  She is moderately active still. She feels that diet is ok most of the time.   Very remote history of smoking.  Occasional EtOH  Review of Systems  Constitutional:  Negative for chills, fever, malaise/fatigue and weight loss.  HENT:  Negative for congestion, ear pain and sore throat.   Eyes:  Negative for blurred vision, double vision and pain.  Respiratory:  Negative for cough and shortness of breath.   Cardiovascular:  Negative for chest pain and palpitations.  Gastrointestinal:  Negative for abdominal pain, blood in stool, constipation, heartburn and nausea.  Genitourinary:  Negative for dysuria and urgency.  Musculoskeletal:  Negative for joint pain and myalgias.  Neurological:  Negative for dizziness and headaches.  Endo/Heme/Allergies:  Does not bruise/bleed easily.  Psychiatric/Behavioral:  Negative for depression. The patient is not nervous/anxious and does not have insomnia.     Allergies  Allergen Reactions   Bee Venom Anaphylaxis   Methotrexate Derivatives Other (See Comments)    Acute kidney injury   Oxycodone Other (See Comments)    Adrenal crisis while taking both prednisone and oxycodone    Prednisone     Adrenal crisis while taking both prednisone and oxycodone    Verapamil Hives   Latex Rash   Nickel Rash   Nitrofuran Derivatives Rash   Sulfasalazine Diarrhea and Nausea And Vomiting    Past Medical History:  Diagnosis Date   Adrenal crisis (HCC) 02/12/2016   AKI (acute kidney injury) (HCC) 02/2016   Allergy    Anemia Yrs    Arthritis    Carotid stenosis, asymptomatic, bilateral 05/26/2017   Dopplers 02/12/2016: right - mid-distal 40-59% stenosis; left - 40-59% stenosis   Cataract    CKD (chronic kidney disease) stage 3, GFR 30-59 ml/min (HCC) 11/02/2017   Decreased calculated GFR 06/15/2017   Gallstones 06/22/2017   History of echocardiogram 11/07/2017   04/09/2016: EF 66%, mild diastolic dysfunction, trivial MR and TR   Hypertension    Hypothyroidism    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   Rheumatoid arthritis (HCC)    Rheumatoid arthritis involving multiple sites (HCC) 05/26/2017   Transaminitis 06/15/2017   Normal abdominal US 06/22/17   Well adult exam 08/04/2023    Past Surgical History:  Procedure Laterality Date   APPENDECTOMY     CATARACT EXTRACTION, BILATERAL  2015   JOINT REPLACEMENT  2018 2019   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   TOTAL KNEE ARTHROPLASTY Right 12/16/2017   Procedure: RIGHT TOTAL KNEE ARTHROPLASTY;  Surgeon: Jodi Geralds, MD;  Location: [facility];  Service: Orthopedics;  Laterality: Right;  Adductor Block    Social History   Socioeconomic History   Marital status: Married    Spouse name: Thurmond Bing   Number of children: 0   Years of education: 16   Highest education level: Associate degree: occupational, Scientist, product/process development, or vocational program  Occupational History   Occupation: [employer]    Comment: retired  Tobacco  Use   Smoking status: Former    Current packs/day: 0.00    Types: Cigarettes    Quit date: 02/07/1974    Years since quitting: 49.5   Smokeless tobacco: Never  Vaping Use   Vaping status: Never Used  Substance and Sexual Activity   Alcohol use: Yes    Alcohol/week: 1.0 standard drink of alcohol    Types: 1 Standard drinks or equivalent per week    Comment: Once in a while   Drug use: No   Sexual activity: Yes    Partners: Male    Birth control/protection: Post-menopausal  Other Topics Concern   Not  on file  Social History Narrative   06/27/2021 - recently married in 2020 - originally from [place]       Lives with her husband. She enjoys walking and making wreaths.   Social Determinants of Health   Financial Resource Strain: Low Risk  (08/02/2023)   Overall Financial Resource Strain (CARDIA)    Difficulty of Paying Living Expenses: Not very hard  Recent Concern: Financial Resource Strain - Medium Risk (06/28/2023)   Overall Financial Resource Strain (CARDIA)    Difficulty of Paying Living Expenses: Somewhat hard  Food Insecurity: Food Insecurity Present (08/02/2023)   Hunger Vital Sign    Worried About Running Out of Food in the Last Year: Never true    Ran Out of Food in the Last Year: Sometimes true  Transportation Needs: No Transportation Needs (08/02/2023)   PRAPARE - Administrator, Civil Service (Medical): No    Lack of Transportation (Non-Medical): No  Physical Activity: Sufficiently Active (08/02/2023)   Exercise Vital Sign    Days of Exercise per Week: 7 days    Minutes of Exercise per Session: 30 min  Stress: No Stress Concern Present (08/02/2023)   [employer] of Occupational Health - Occupational Stress Questionnaire    Feeling of Stress : Only a little  Social Connections: Socially Integrated (08/02/2023)   Social Connection and Isolation Panel [NHANES]    Frequency of Communication with Friends and Family: More than three times a week    Frequency of Social Gatherings with Friends and Family: More than three times a week    Attends Religious Services: 1 to 4 times per year    Active Member of [employer] or Organizations: Yes    Attends Banker Meetings: 1 to 4 times per year    Marital Status: Married    Family History  Problem Relation Age of Onset   Diabetes Mother    Arthritis Mother    Lung cancer Father    Hypertension Father    Uterine cancer Sister     Health Maintenance  Topic Date Due   Pneumonia Vaccine 81+ Years old  (1 of 1 - PCV) Never done   COVID-19 Vaccine (6 - 2023-24 season) 08/20/2023 (Originally 06/05/2023)   INFLUENZA VACCINE  01/02/2024 (Originally 05/05/2023)   DEXA SCAN  04/22/2024   Medicare Annual Wellness (AWV)  07/03/2024   MAMMOGRAM  08/11/2024   DTaP/Tdap/Td (2 - Td or Tdap) 03/25/2027   Colonoscopy  04/04/2027   Hepatitis C Screening  Completed   Zoster Vaccines- Shingrix  Completed   HPV VACCINES  Aged Out     ----------------------------------------------------------------------------------------------------------------------------------------------------------------------------------------------------------------- Physical Exam BP (!) 147/77 (BP Location: Left Arm, Patient Position: Sitting, Cuff Size: Large)   Pulse 72   Ht 5\' 6"  (1.676 m)   Wt 160 lb (72.6 kg)   SpO2 97%  BMI 25.82 kg/m   Physical Exam Constitutional:      General: She is not in acute distress. HENT:     Head: Normocephalic and atraumatic.     Right Ear: Tympanic membrane and ear canal normal.     Left Ear: Tympanic membrane and ear canal normal.     Nose: Nose normal.  Eyes:     General: No scleral icterus.    Conjunctiva/sclera: Conjunctivae normal.  Neck:     Thyroid: No thyromegaly.  Cardiovascular:     Rate and Rhythm: Normal rate and regular rhythm.     Heart sounds: Normal heart sounds.  Pulmonary:     Effort: Pulmonary effort is normal.     Breath sounds: Normal breath sounds.  Abdominal:     General: Bowel sounds are normal. There is no distension.     Palpations: Abdomen is soft.     Tenderness: There is no abdominal tenderness. There is no guarding.  Musculoskeletal:        General: Normal range of motion.     Cervical back: Normal range of motion and neck supple.  Lymphadenopathy:     Cervical: No cervical adenopathy.  Skin:    General: Skin is warm and dry.     Findings: No rash.     Comments: Erythematous papules on arms.   Neurological:     General: No focal deficit  present.     Mental Status: She is alert and oriented to person, place, and time.     Cranial Nerves: No cranial nerve deficit.     Coordination: Coordination normal.  Psychiatric:        Mood and Affect: Mood normal.        Behavior: Behavior normal.     ------------------------------------------------------------------------------------------------------------------------------------------------------------------------------------------------------------------- Assessment and Plan  Well adult exam Well adult Orders Placed This Encounter  Procedures   Flu Vaccine Trivalent High Dose (Fluad)   Pneumococcal conjugate vaccine 20-valent (Prevnar 20)   CMP14+EGFR   CBC with Differential/Platelet   Lipid Panel With LDL/HDL Ratio   TSH   Iron, TIBC and Ferritin Panel   B12 and Folate Panel   Vitamin D (25 hydroxy)  Screenings:  Per lab orders Immunizations: Flu and Prevnar 20 Anticipatory guidance/Risk factor reduction:  Recommendations per AVS.   Rash Appearance of contact dermatitis.  Adding topical triamcinolone.    Meds ordered this encounter  Medications   triamcinolone ointment (KENALOG) 0.5 %    Sig: Apply 1 Application topically daily as needed.    Dispense:  30 g    Refill:  1    No follow-ups on file.    This visit occurred during the SARS-CoV-2 public health emergency.  Safety protocols were in place, including screening questions prior to the visit, additional usage of staff PPE, and extensive cleaning of exam room while observing appropriate contact time as indicated for disinfecting solutions.

## 2023-08-04 NOTE — Assessment & Plan Note (Signed)
Appearance of contact dermatitis.  Adding topical triamcinolone.

## 2023-08-04 NOTE — Assessment & Plan Note (Signed)
Well adult Orders Placed This Encounter  Procedures   Flu Vaccine Trivalent High Dose (Fluad)   Pneumococcal conjugate vaccine 20-valent (Prevnar 20)   CMP14+EGFR   CBC with Differential/Platelet   Lipid Panel With LDL/HDL Ratio   TSH   Iron, TIBC and Ferritin Panel   B12 and Folate Panel   Vitamin D (25 hydroxy)  Screenings:  Per lab orders Immunizations: Flu and Prevnar 20 Anticipatory guidance/Risk factor reduction:  Recommendations per AVS.

## 2023-08-05 LAB — CBC WITH DIFFERENTIAL/PLATELET
Basophils Absolute: 0.1 10*3/uL (ref 0.0–0.2)
Basos: 1 %
EOS (ABSOLUTE): 0.1 10*3/uL (ref 0.0–0.4)
Eos: 2 %
Hematocrit: 36.7 % (ref 34.0–46.6)
Hemoglobin: 12.1 g/dL (ref 11.1–15.9)
Immature Grans (Abs): 0 10*3/uL (ref 0.0–0.1)
Immature Granulocytes: 0 %
Lymphocytes Absolute: 1.4 10*3/uL (ref 0.7–3.1)
Lymphs: 30 %
MCH: 31.3 pg (ref 26.6–33.0)
MCHC: 33 g/dL (ref 31.5–35.7)
MCV: 95 fL (ref 79–97)
Monocytes Absolute: 0.6 10*3/uL (ref 0.1–0.9)
Monocytes: 12 %
Neutrophils Absolute: 2.7 10*3/uL (ref 1.4–7.0)
Neutrophils: 55 %
Platelets: 224 10*3/uL (ref 150–450)
RBC: 3.87 x10E6/uL (ref 3.77–5.28)
RDW: 14 % (ref 11.7–15.4)
WBC: 4.9 10*3/uL (ref 3.4–10.8)

## 2023-08-05 LAB — B12 AND FOLATE PANEL
Folate: >20 ng/mL (ref >3.0)
Vitamin B-12: 1388 pg/mL — ABNORMAL HIGH (ref 232–1245)

## 2023-08-05 LAB — CMP14+EGFR
ALT: 22 [IU]/L (ref 0–32)
AST: 33 [IU]/L (ref 0–40)
Albumin: 4.3 g/dL (ref 3.8–4.8)
Alkaline Phosphatase: 54 [IU]/L (ref 44–121)
BUN/Creatinine Ratio: 13 (ref 12–28)
BUN: 14 mg/dL (ref 8–27)
Bilirubin Total: 0.4 mg/dL (ref 0.0–1.2)
CO2: 23 mmol/L (ref 20–29)
Calcium: 9.5 mg/dL (ref 8.7–10.3)
Chloride: 104 mmol/L (ref 96–106)
Creatinine, Ser: 1.04 mg/dL — ABNORMAL HIGH (ref 0.57–1.00)
Globulin, Total: 1.6 g/dL (ref 1.5–4.5)
Glucose: 81 mg/dL (ref 70–99)
Potassium: 4.4 mmol/L (ref 3.5–5.2)
Sodium: 141 mmol/L (ref 134–144)
Total Protein: 5.9 g/dL — ABNORMAL LOW (ref 6.0–8.5)
eGFR: 56 mL/min/{1.73_m2} — ABNORMAL LOW (ref >59)

## 2023-08-05 LAB — IRON,TIBC AND FERRITIN PANEL
Ferritin: 31 ng/mL (ref 15–150)
Iron Saturation: 50 % (ref 15–55)
Iron: 181 ug/dL — ABNORMAL HIGH (ref 27–139)
Total Iron Binding Capacity: 361 ug/dL (ref 250–450)
UIBC: 180 ug/dL (ref 118–369)

## 2023-08-05 LAB — LIPID PANEL WITH LDL/HDL RATIO
Cholesterol, Total: 172 mg/dL (ref 100–199)
HDL: 99 mg/dL (ref >39)
LDL Chol Calc (NIH): 62 mg/dL (ref 0–99)
LDL/HDL Ratio: 0.6 ratio (ref 0.0–3.2)
Triglycerides: 55 mg/dL (ref 0–149)
VLDL Cholesterol Cal: 11 mg/dL (ref 5–40)

## 2023-08-05 LAB — VITAMIN D 25 HYDROXY (VIT D DEFICIENCY, FRACTURES): Vit D, 25-Hydroxy: 49.6 ng/mL (ref 30.0–100.0)

## 2023-08-05 LAB — TSH: TSH: 0.574 u[IU]/mL (ref 0.450–4.500)

## 2023-08-21 ENCOUNTER — Encounter: Payer: Self-pay | Admitting: Family Medicine

## 2023-09-08 ENCOUNTER — Other Ambulatory Visit: Payer: Self-pay | Admitting: Family Medicine

## 2023-10-03 ENCOUNTER — Telehealth: Payer: Medicare HMO | Admitting: Family Medicine

## 2023-10-03 DIAGNOSIS — J019 Acute sinusitis, unspecified: Secondary | ICD-10-CM | POA: Diagnosis not present

## 2023-10-03 DIAGNOSIS — B9689 Other specified bacterial agents as the cause of diseases classified elsewhere: Secondary | ICD-10-CM | POA: Diagnosis not present

## 2023-10-03 MED ORDER — AMOXICILLIN-POT CLAVULANATE 875-125 MG PO TABS: 1.0000 | ORAL_TABLET | Freq: Two times a day (BID) | ORAL | 0 refills | Status: AC

## 2023-10-03 NOTE — Progress Notes (Signed)
Virtual Visit Consent   Talianna Kubiak, you are scheduled for a virtual visit with a [facility] provider today. Just as with appointments in the office, your consent must be obtained to participate. Your consent will be active for this visit and any virtual visit you may have with one of our providers in the next 365 days. If you have a MyChart account, a copy of this consent can be sent to you electronically.  As this is a virtual visit, video technology does not allow for your provider to perform a traditional examination. This may limit your provider's ability to fully assess your condition. If your provider identifies any concerns that need to be evaluated in person or the need to arrange testing (such as labs, EKG, etc.), we will make arrangements to do so. Although advances in technology are sophisticated, we cannot ensure that it will always work on either your end or our end. If the connection with a video visit is poor, the visit may have to be switched to a telephone visit. With either a video or telephone visit, we are not always able to ensure that we have a secure connection.  By engaging in this virtual visit, you consent to the provision of healthcare and authorize for your insurance to be billed (if applicable) for the services provided during this visit. Depending on your insurance coverage, you may receive a charge related to this service.  I need to obtain your verbal consent now. Are you willing to proceed with your visit today? Diarra Geitner has provided verbal consent on 10/03/2023 for a virtual visit (video or telephone). Freddy Finner, NP  Date: 10/03/2023 1:44 PM  Virtual Visit via Video Note   I, Freddy Finner, connected with  Macaylah Bater  ([id number], 1951/05/21) on 10/03/23 at  1:45 PM EST by a video-enabled telemedicine application and verified that I am speaking with the correct person using two identifiers.  Location: Patient: Virtual Visit Location  Patient: Home Provider: Virtual Visit Location Provider: Home Office   I discussed the limitations of evaluation and management by telemedicine and the availability of in person appointments. The patient expressed understanding and agreed to proceed.    History of Present Illness: Jazmari Collie is a 72 y.o. who identifies as a female who was assigned female at birth, and is being seen today for sinus congestion.  Onset was started a week ago-with what she thought was allergies getting worse with symptoms  Associated symptoms are lymph node swelling, cough and sore throat, runny nose and stuffy nose, drainage is yellow headache (that is improved) Modifying factors are flonase and Tylenol  Denies chest pain, shortness of breath, fevers  Exposure to sick contacts- unknown COVID test:  no  Vaccines:  up to date   Problems:  Patient Active Problem List   Diagnosis Date Noted   Well adult exam 08/04/2023   Rash 08/04/2023   Thinning hair 05/20/2023   Vesicular rash 04/14/2023   Acute sinusitis 01/11/2023   Insomnia 01/11/2023   Primary osteoarthritis, 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   Right lumbar radiculitis 03/05/2022   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   Primary generalized (osteo)arthritis 07/03/2021   COVID-19 02/23/2021   Postherpetic neuralgia 05/21/2019   Normocytic anemia 02/22/2018   Encounter for monitoring statin therapy 02/22/2018   Dyslipidemia 02/22/2018   Primary osteoarthritis of right knee 12/16/2017   Benign hypertension with CKD (chronic kidney disease) stage III (HCC) 12/04/2017   History of echocardiogram 11/07/2017  Chronic kidney disease, stage 3a (HCC) 11/02/2017   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   Gallstones 06/22/2017   Elevated alkaline phosphatase level 06/15/2017   Transaminitis 06/15/2017   Acquired hypothyroidism 05/26/2017   Essential hypertension 05/26/2017   H/O total knee  replacement, left 05/26/2017   History of adrenal insufficiency 05/26/2017   Rheumatoid arthritis involving multiple sites (HCC) 05/26/2017   Allergic rhinitis 05/26/2017   Right carotid bruit 05/26/2017   Carotid stenosis, asymptomatic, bilateral 05/26/2017   Allergy to honey bee venom 05/26/2017    Allergies:  Allergies  Allergen Reactions   Bee Venom Anaphylaxis   Methotrexate Derivatives Other (See Comments)    Acute kidney injury   Oxycodone Other (See Comments)    Adrenal crisis while taking both prednisone and oxycodone    Prednisone     Adrenal crisis while taking both prednisone and oxycodone    Verapamil Hives   Latex Rash   Nickel Rash   Nitrofuran Derivatives Rash   Sulfasalazine Diarrhea and Nausea And Vomiting   Medications:  Current Outpatient Medications:    acetaminophen (TYLENOL) 650 MG CR tablet, Take 2 tablets (1,300 mg total) by mouth 2 (two) times daily as needed for pain., Disp: , Rfl:    amLODipine (NORVASC) 10 MG tablet, TAKE 1 TABLET BY MOUTH EVERY DAY, Disp: 90 tablet, Rfl: 1   aspirin EC 81 MG tablet, Take 81 mg by mouth daily., Disp: , Rfl:    atorvastatin (LIPITOR) 40 MG tablet, TAKE 1 TABLET BY MOUTH EVERY DAY, Disp: 90 tablet, Rfl: 3   Calcium Carb-Cholecalciferol (CALCIUM 600 + D PO), Take 2 tablets by mouth daily., Disp: , Rfl:    Cholecalciferol (VITAMIN D) 2000 units tablet, Take 2,000 Units by mouth daily., Disp: , Rfl:    docusate sodium (COLACE) 100 MG capsule, Take 100 mg by mouth daily., Disp: , Rfl:    EPINEPHRINE 0.3 mg/0.3 mL IJ SOAJ injection, INJECT 0.3 MLS (0.3 MG TOTAL) INTO THE MUSCLE ONCE AS NEEDED FOR UP TO 1 DOSE., Disp: 2 each, Rfl: 3   ferrous sulfate 325 (65 FE) MG tablet, Take 325 mg by mouth daily with breakfast., Disp: , Rfl:    fluticasone (FLONASE) 50 MCG/ACT nasal spray, Place 2 sprays into both nostrils daily. SPRAY 2 SPRAYS INTO EACH NOSTRIL EVERY DAY, Disp: 48 mL, Rfl: 6   folic acid (FOLVITE) 400 MCG tablet, Take 1  tablet (400 mcg total) by mouth daily., Disp: 90 tablet, Rfl: 3   gabapentin (NEURONTIN) 300 MG capsule, TAKE 1 CAPSULE BY MOUTH EVERYDAY AT BEDTIME, Disp: 90 capsule, Rfl: 1   hydroxychloroquine (PLAQUENIL) 200 MG tablet, Take 200 mg by mouth 2 (two) times daily. , Disp: , Rfl:    leflunomide (ARAVA) 20 MG tablet, Take 1 tablet by mouth daily., Disp: , Rfl:    levothyroxine (SYNTHROID) 50 MCG tablet, TAKE 1 TABLET BY MOUTH EVERY DAY BEFORE BREAKFAST, Disp: 90 tablet, Rfl: 3   lisinopril (ZESTRIL) 20 MG tablet, TAKE 1 TABLET BY MOUTH EVERY DAY, Disp: 90 tablet, Rfl: 3   traMADol (ULTRAM) 50 MG tablet, Take 1 tablet (50 mg total) by mouth every 12 (twelve) hours as needed., Disp: 60 tablet, Rfl: 3   traZODone (DESYREL) 50 MG tablet, TAKE 1 TO 2 TABLETS BY MOUTH AT BEDTIME AS NEEDED, Disp: 180 tablet, Rfl: 0   triamcinolone ointment (KENALOG) 0.5 %, Apply 1 Application topically daily as needed., Disp: 30 g, Rfl: 1   Turmeric 500 MG CAPS, Take  by mouth daily., Disp: , Rfl:   Observations/Objective: Patient is well-developed, well-nourished in no acute distress.  Resting comfortably  at home.  Head is normocephalic, atraumatic.  No labored breathing.  Speech is clear and coherent with logical content.  Patient is alert and oriented at baseline.    Assessment and Plan:   1. Acute bacterial sinusitis (Primary)  - amoxicillin-clavulanate (AUGMENTIN) 875-125 MG tablet; Take 1 tablet by mouth 2 (two) times daily for 7 days.  Dispense: 14 tablet; Refill: 0  URI recommendations: - Increased rest - Increasing Fluids - Acetaminophen / ibuprofen as needed for fever/pain.  - Salt water gargling, chloraseptic spray and throat lozenges - Mucinex if mucus is present and increasing.  - Saline nasal spray if congestion or if nasal passages feel dry. - Humidifying the air.    Reviewed side effects, risks and benefits of medication.    Patient acknowledged agreement and understanding of the plan.    Past Medical, Surgical, Social History, Allergies, and Medications have been Reviewed.    Follow Up Instructions: I discussed the assessment and treatment plan with the patient. The patient was provided an opportunity to ask questions and all were answered. The patient agreed with the plan and demonstrated an understanding of the instructions.  A copy of instructions were sent to the patient via MyChart unless otherwise noted below.    The patient was advised to call back or seek an in-person evaluation if the symptoms worsen or if the condition fails to improve as anticipated.    Freddy Finner, NP

## 2023-10-03 NOTE — Patient Instructions (Addendum)
[name], thank you for joining Freddy Finner, NP for today's virtual visit.  While this provider is not your primary care provider (PCP), if your PCP is located in our provider database this encounter information will be shared with them immediately following your visit.   A [facility] MyChart account gives you access to today's visit and all your visits, tests, and labs performed at [facility] " click here if you don't have a [facility] MyChart account or go to [email or web address]  Consent: (Patient) Prudencia Petrella provided verbal consent for this virtual visit at the beginning of the encounter.  Current Medications:  Current Outpatient Medications:    acetaminophen (TYLENOL) 650 MG CR tablet, Take 2 tablets (1,300 mg total) by mouth 2 (two) times daily as needed for pain., Disp: , Rfl:    amLODipine (NORVASC) 10 MG tablet, TAKE 1 TABLET BY MOUTH EVERY DAY, Disp: 90 tablet, Rfl: 1   aspirin EC 81 MG tablet, Take 81 mg by mouth daily., Disp: , Rfl:    atorvastatin (LIPITOR) 40 MG tablet, TAKE 1 TABLET BY MOUTH EVERY DAY, Disp: 90 tablet, Rfl: 3   Calcium Carb-Cholecalciferol (CALCIUM 600 + D PO), Take 2 tablets by mouth daily., Disp: , Rfl:    Cholecalciferol (VITAMIN D) 2000 units tablet, Take 2,000 Units by mouth daily., Disp: , Rfl:    docusate sodium (COLACE) 100 MG capsule, Take 100 mg by mouth daily., Disp: , Rfl:    EPINEPHRINE 0.3 mg/0.3 mL IJ SOAJ injection, INJECT 0.3 MLS (0.3 MG TOTAL) INTO THE MUSCLE ONCE AS NEEDED FOR UP TO 1 DOSE., Disp: 2 each, Rfl: 3   ferrous sulfate 325 (65 FE) MG tablet, Take 325 mg by mouth daily with breakfast., Disp: , Rfl:    fluticasone (FLONASE) 50 MCG/ACT nasal spray, Place 2 sprays into both nostrils daily. SPRAY 2 SPRAYS INTO EACH NOSTRIL EVERY DAY, Disp: 48 mL, Rfl: 6   folic acid (FOLVITE) 400 MCG tablet, Take 1 tablet (400 mcg total) by mouth daily., Disp: 90 tablet, Rfl: 3   gabapentin (NEURONTIN) 300 MG capsule,  TAKE 1 CAPSULE BY MOUTH EVERYDAY AT BEDTIME, Disp: 90 capsule, Rfl: 1   hydroxychloroquine (PLAQUENIL) 200 MG tablet, Take 200 mg by mouth 2 (two) times daily. , Disp: , Rfl:    leflunomide (ARAVA) 20 MG tablet, Take 1 tablet by mouth daily., Disp: , Rfl:    levothyroxine (SYNTHROID) 50 MCG tablet, TAKE 1 TABLET BY MOUTH EVERY DAY BEFORE BREAKFAST, Disp: 90 tablet, Rfl: 3   lisinopril (ZESTRIL) 20 MG tablet, TAKE 1 TABLET BY MOUTH EVERY DAY, Disp: 90 tablet, Rfl: 3   traMADol (ULTRAM) 50 MG tablet, Take 1 tablet (50 mg total) by mouth every 12 (twelve) hours as needed., Disp: 60 tablet, Rfl: 3   traZODone (DESYREL) 50 MG tablet, TAKE 1 TO 2 TABLETS BY MOUTH AT BEDTIME AS NEEDED, Disp: 180 tablet, Rfl: 0   triamcinolone ointment (KENALOG) 0.5 %, Apply 1 Application topically daily as needed., Disp: 30 g, Rfl: 1   Turmeric 500 MG CAPS, Take by mouth daily., Disp: , Rfl:    Medications ordered in this encounter:  No orders of the defined types were placed in this encounter.    *If you need refills on other medications prior to your next appointment, please contact your pharmacy*  Follow-Up: Call back or seek an in-person evaluation if the symptoms worsen or if the condition fails to improve as anticipated.  [facility] [phone]  Other Instructions  URI recommendations:  Take meds as directed  - Increased rest - Increasing Fluids - Acetaminophen / ibuprofen as needed for fever/pain.  - Salt water gargling, chloraseptic spray and throat lozenges - Mucinex if mucus is present and increasing.  - Saline nasal spray if congestion or if nasal passages feel dry. - Humidifying the air.   If you have been instructed to have an in-person evaluation today at a local Urgent Care facility, please use the link below. It will take you to a list of all of our available [facility], including address, phone number and hours of operation. Please do not delay care.  [facility]  If you or a family member do not have a primary care provider, use the link below to schedule a visit and establish care. When you choose a [facility] primary care physician or advanced practice provider, you gain a long-term partner in health. Find a Primary Care Provider  Learn more about [facility]'s in-office and virtual care options: [facility] - Get Care Now

## 2023-10-06 ENCOUNTER — Other Ambulatory Visit: Payer: Self-pay | Admitting: Family Medicine

## 2023-10-13 DIAGNOSIS — Z0100 Encounter for examination of eyes and vision without abnormal findings: Secondary | ICD-10-CM | POA: Diagnosis not present

## 2023-10-13 DIAGNOSIS — H52203 Unspecified astigmatism, bilateral: Secondary | ICD-10-CM | POA: Diagnosis not present

## 2023-11-01 ENCOUNTER — Other Ambulatory Visit: Payer: Self-pay | Admitting: Family Medicine

## 2023-11-09 ENCOUNTER — Encounter: Payer: Self-pay | Admitting: Family Medicine

## 2023-11-09 ENCOUNTER — Ambulatory Visit: Payer: Medicare HMO

## 2023-11-09 ENCOUNTER — Telehealth: Payer: Medicare HMO | Admitting: Family Medicine

## 2023-11-09 VITALS — Ht 66.0 in | Wt 160.0 lb

## 2023-11-09 DIAGNOSIS — L989 Disorder of the skin and subcutaneous tissue, unspecified: Secondary | ICD-10-CM

## 2023-11-09 MED ORDER — PREDNISONE 10 MG (21) PO TBPK: ORAL_TABLET | ORAL | 0 refills | Status: AC

## 2023-11-09 MED ORDER — TRIAMCINOLONE ACETONIDE 0.5 % EX OINT: 1.0000 | TOPICAL_OINTMENT | Freq: Every day | CUTANEOUS | 1 refills | Status: DC | PRN

## 2023-11-09 NOTE — Assessment & Plan Note (Signed)
Rash with appearance of atopic dermatitis versus psoriasis.  Does not appear fungal in nature.  Adding taper of prednisone and triamcinolone.  Referral placed to dermatology.

## 2023-11-09 NOTE — Progress Notes (Signed)
 Melody Moore - 73 y.o. female MRN [id number]  Date of birth: [birth year]   This visit type was conducted due to national recommendations for restrictions regarding the COVID-19 Pandemic (e.g. social distancing).  This format is felt to be most appropriate for this patient at this time.  All issues noted in this document were discussed and addressed.  No physical exam was performed (except for noted visual exam findings with Video Visits).  I discussed the limitations of evaluation and management by telemedicine and the availability of in person appointments. The patient expressed understanding and agreed to proceed.  I connected withNAME@ on 11/09/23 at 10:50 AM EST by a video enabled telemedicine application and verified that I am speaking with the correct person using two identifiers.  Present at visit: Melody Ku, DO [place]   Patient Location: Home [address] [place] [address]   Provider location:   [facility]  Chief Complaint  Patient presents with   bumps    HPI  Melody Moore is a 73 y.o. female who presents via audio/video conferencing for a telehealth visit today.  She reports that she has noted skin lesions on her extremities.  These are raised, white and scaly appearing.  They are itchy at times.  She denies pain associated with these.  She denies any recent changes to eliquis, detergents or lotions.  She has not had any change to her antirheumatic therapeutics.  She did have a sinus infection towards the end of December but has been well otherwise.  She initially thought these could be bedbug bites and had an exterminator check her home but nothing was found.     ROS:  A comprehensive ROS was completed and negative except as noted per HPI  Past Medical History:  Diagnosis Date   Adrenal crisis (HCC) 02/12/2016   AKI (acute kidney injury) (HCC) 02/2016   Allergy    Anemia Yrs   Arthritis    Carotid stenosis, asymptomatic, bilateral  05/26/2017   Dopplers 02/12/2016: right - mid-distal 40-59% stenosis; left - 40-59% stenosis   Cataract    CKD (chronic kidney disease) stage 3, GFR 30-59 ml/min (HCC) 11/02/2017   Decreased calculated GFR 06/15/2017   Gallstones 06/22/2017   History of echocardiogram 11/07/2017   04/09/2016: EF 66%, mild diastolic dysfunction, trivial MR and TR   Hypertension    Hypothyroidism    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   Rheumatoid arthritis (HCC)    Rheumatoid arthritis involving multiple sites (HCC) 05/26/2017   Transaminitis 06/15/2017   Normal abdominal US  06/22/17   Well adult exam 08/04/2023    Past Surgical History:  Procedure Laterality Date   APPENDECTOMY     CATARACT EXTRACTION, BILATERAL  2015   JOINT REPLACEMENT  2018 2019   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   TOTAL KNEE ARTHROPLASTY Right 12/16/2017   Procedure: RIGHT TOTAL KNEE ARTHROPLASTY;  Surgeon: Yvone Rush, MD;  Location: [facility];  Service: Orthopedics;  Laterality: Right;  Adductor Block    Family History  Problem Relation Age of Onset   Diabetes Mother    Arthritis Mother    Lung cancer Father    Hypertension Father    Uterine cancer Sister     Social History   Socioeconomic History   Marital status: Married    Spouse name: Melody Moore   Number of children: 0   Years of education: 16   Highest education level: Associate degree: occupational, scientist, product/process development, or vocational program  Occupational History  Occupation: [employer]    Comment: retired  Tobacco Use   Smoking status: Former    Current packs/day: 0.00    Types: Cigarettes    Quit date: 02/07/1974    Years since quitting: 49.7   Smokeless tobacco: Never  Vaping Use   Vaping status: Never Used  Substance and Sexual Activity   Alcohol use: Yes    Alcohol/week: 1.0 standard drink of alcohol    Types: 1 Standard drinks or equivalent per week    Comment: Once in a while   Drug use: No   Sexual  activity: Yes    Partners: Male    Birth control/protection: Post-menopausal  Other Topics Concern   Not on file  Social History Narrative   06/27/2021 - recently married in 2020 - originally from [place]       Lives with her husband. She enjoys walking and making wreaths.   Social Drivers of Corporate Investment Banker Strain: Low Risk  (08/02/2023)   Overall Financial Resource Strain (CARDIA)    Difficulty of Paying Living Expenses: Not very hard  Recent Concern: Financial Resource Strain - Medium Risk (06/28/2023)   Overall Financial Resource Strain (CARDIA)    Difficulty of Paying Living Expenses: Somewhat hard  Food Insecurity: Food Insecurity Present (08/02/2023)   Hunger Vital Sign    Worried About Running Out of Food in the Last Year: Never true    Ran Out of Food in the Last Year: Sometimes true  Transportation Needs: No Transportation Needs (08/02/2023)   PRAPARE - Administrator, Civil Service (Medical): No    Lack of Transportation (Non-Medical): No  Physical Activity: Sufficiently Active (08/02/2023)   Exercise Vital Sign    Days of Exercise per Week: 7 days    Minutes of Exercise per Session: 30 min  Stress: No Stress Concern Present (08/02/2023)   [employer] of Occupational Health - Occupational Stress Questionnaire    Feeling of Stress : Only a little  Social Connections: Socially Integrated (08/02/2023)   Social Connection and Isolation Panel [NHANES]    Frequency of Communication with Friends and Family: More than three times a week    Frequency of Social Gatherings with Friends and Family: More than three times a week    Attends Religious Services: 1 to 4 times per year    Active Member of [employer] or Organizations: Yes    Attends Banker Meetings: 1 to 4 times per year    Marital Status: Married  Catering Manager Violence: Not At Risk (07/04/2023)   Humiliation, Afraid, Rape, and Kick questionnaire    Fear of Current or  Ex-Partner: No    Emotionally Abused: No    Physically Abused: No    Sexually Abused: No     Current Outpatient Medications:    acetaminophen  (TYLENOL ) 650 MG CR tablet, Take 2 tablets (1,300 mg total) by mouth 2 (two) times daily as needed for pain., Disp: , Rfl:    amLODipine  (NORVASC ) 10 MG tablet, TAKE 1 TABLET BY MOUTH EVERY DAY, Disp: 90 tablet, Rfl: 1   aspirin  EC 81 MG tablet, Take 81 mg by mouth daily., Disp: , Rfl:    atorvastatin  (LIPITOR) 40 MG tablet, TAKE 1 TABLET BY MOUTH EVERY DAY, Disp: 90 tablet, Rfl: 3   Calcium  Carb-Cholecalciferol (CALCIUM  600 + D PO), Take 2 tablets by mouth daily., Disp: , Rfl:    Cholecalciferol (VITAMIN D ) 2000 units tablet, Take 2,000 Units by mouth  daily., Disp: , Rfl:    cycloSPORINE (RESTASIS) 0.05 % ophthalmic emulsion, , Disp: , Rfl:    docusate sodium  (COLACE) 100 MG capsule, Take 100 mg by mouth daily., Disp: , Rfl:    EPINEPHRINE  0.3 mg/0.3 mL IJ SOAJ injection, INJECT 0.3 MLS (0.3 MG TOTAL) INTO THE MUSCLE ONCE AS NEEDED FOR UP TO 1 DOSE., Disp: 2 each, Rfl: 3   ferrous sulfate  325 (65 FE) MG tablet, Take 325 mg by mouth daily with breakfast., Disp: , Rfl:    fluticasone  (FLONASE ) 50 MCG/ACT nasal spray, Place 2 sprays into both nostrils daily. SPRAY 2 SPRAYS INTO EACH NOSTRIL EVERY DAY, Disp: 48 mL, Rfl: 6   folic acid  (FOLVITE ) 400 MCG tablet, Take 1 tablet (400 mcg total) by mouth daily., Disp: 90 tablet, Rfl: 3   gabapentin  (NEURONTIN ) 300 MG capsule, TAKE 1 CAPSULE BY MOUTH EVERYDAY AT BEDTIME, Disp: 90 capsule, Rfl: 1   hydroxychloroquine  (PLAQUENIL ) 200 MG tablet, Take 200 mg by mouth 2 (two) times daily. , Disp: , Rfl:    leflunomide (ARAVA) 20 MG tablet, Take 1 tablet by mouth daily., Disp: , Rfl:    levothyroxine  (SYNTHROID ) 50 MCG tablet, TAKE 1 TABLET BY MOUTH EVERY DAY BEFORE BREAKFAST, Disp: 90 tablet, Rfl: 3   lisinopril  (ZESTRIL ) 20 MG tablet, TAKE 1 TABLET BY MOUTH EVERY DAY, Disp: 90 tablet, Rfl: 3   predniSONE  (STERAPRED  UNI-PAK 21 TAB) 10 MG (21) TBPK tablet, Taper as directed on packaging., Disp: 21 tablet, Rfl: 0   traMADol  (ULTRAM ) 50 MG tablet, Take 1 tablet (50 mg total) by mouth every 12 (twelve) hours as needed., Disp: 60 tablet, Rfl: 3   traZODone  (DESYREL ) 50 MG tablet, TAKE 1 TO 2 TABLETS BY MOUTH AT BEDTIME AS NEEDED, Disp: 180 tablet, Rfl: 0   Turmeric 500 MG CAPS, Take by mouth daily., Disp: , Rfl:    triamcinolone  ointment (KENALOG ) 0.5 %, Apply 1 Application topically daily as needed., Disp: 30 g, Rfl: 1  EXAM:  VITALS per patient if applicable: Ht 5' 6 (1.676 m)   Wt 160 lb (72.6 kg)   BMI 25.82 kg/m   GENERAL: alert, oriented, appears well and in no acute distress  HEENT: atraumatic, conjunttiva clear, no obvious abnormalities on inspection of external nose and ears  NECK: normal movements of the head and neck  LUNGS: on inspection no signs of respiratory distress, breathing rate appears normal, no obvious gross SOB, gasping or wheezing  CV: no obvious cyanosis  MS: moves all visible extremities without noticeable abnormality  PSYCH/NEURO: pleasant and cooperative, no obvious depression or anxiety, speech and thought processing grossly intact  Skin: She has a scaly white plaque like appearing lesion scattered on her extremities.  ASSESSMENT AND PLAN:  Discussed the following assessment and plan:  Skin lesions Rash with appearance of atopic dermatitis versus psoriasis.  Does not appear fungal in nature.  Adding taper of prednisone  and triamcinolone .  Referral placed to dermatology.     I discussed the assessment and treatment plan with the patient. The patient was provided an opportunity to ask questions and all were answered. The patient agreed with the plan and demonstrated an understanding of the instructions.   The patient was advised to call back or seek an in-person evaluation if the symptoms worsen or if the condition fails to improve as anticipated.    Melody Ku, DO

## 2023-11-11 ENCOUNTER — Encounter: Payer: Self-pay | Admitting: Family Medicine

## 2023-11-11 DIAGNOSIS — D3142 Benign neoplasm of left ciliary body: Secondary | ICD-10-CM | POA: Diagnosis not present

## 2023-11-16 DIAGNOSIS — L400 Psoriasis vulgaris: Secondary | ICD-10-CM | POA: Diagnosis not present

## 2023-11-22 DIAGNOSIS — M25561 Pain in right knee: Secondary | ICD-10-CM | POA: Diagnosis not present

## 2023-12-01 ENCOUNTER — Ambulatory Visit (INDEPENDENT_AMBULATORY_CARE_PROVIDER_SITE_OTHER): Payer: Medicare HMO | Admitting: Sports Medicine

## 2023-12-01 ENCOUNTER — Encounter: Payer: Self-pay | Admitting: Sports Medicine

## 2023-12-01 ENCOUNTER — Ambulatory Visit: Payer: Medicare HMO

## 2023-12-01 DIAGNOSIS — M069 Rheumatoid arthritis, unspecified: Secondary | ICD-10-CM | POA: Diagnosis not present

## 2023-12-01 DIAGNOSIS — S299XXA Unspecified injury of thorax, initial encounter: Secondary | ICD-10-CM

## 2023-12-01 DIAGNOSIS — M5416 Radiculopathy, lumbar region: Secondary | ICD-10-CM

## 2023-12-01 DIAGNOSIS — R0781 Pleurodynia: Secondary | ICD-10-CM

## 2023-12-01 DIAGNOSIS — I700 Atherosclerosis of aorta: Secondary | ICD-10-CM | POA: Diagnosis not present
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
## 2023-12-01 NOTE — Assessment & Plan Note (Signed)
 Margie also has rheumatologist, she is coming up on a refill for her leflunomide, I did inform her that this needs to come from rheumatology. She currently sees Dr. Dierdre Forth, it sounds like there was a disagreement with one of the medical assistants, she is requesting a referral to a different rheumatologist. As she does need another refill on leflunomide and it has been 6 months since she has seen him she will make another appointment, she will discuss but of her concerns she has with the medical assistant with Dr. Dierdre Forth and my hope is that she decides to stay with him, continuity of care is crucially important. Per her request though we will place a referral to a different rheumatologist.  She can cancel this if she is fine to stick with her current 1.

## 2023-12-01 NOTE — Assessment & Plan Note (Signed)
 Melody Moore also had a recent accidental fall against an end table, she has some bruising and tenderness left flank mid axillary line. She has tenderness along some of the floating ribs along the posterior axillary line and mid axillary line. She has tramadol that she can use for pain, I have encouraged use of cryotherapy and a pillow. We will going get some x-rays to ensure no fractures, if I do see a fracture it is reasonable to bring her back for a rib belt.

## 2023-12-06 DIAGNOSIS — N1831 Chronic kidney disease, stage 3a: Secondary | ICD-10-CM | POA: Diagnosis not present

## 2023-12-06 DIAGNOSIS — M8589 Other specified disorders of bone density and structure, multiple sites: Secondary | ICD-10-CM | POA: Diagnosis not present

## 2023-12-06 DIAGNOSIS — M353 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: Secondary | ICD-10-CM | POA: Diagnosis not present

## 2023-12-06 DIAGNOSIS — B0223 Postherpetic polyneuropathy: Secondary | ICD-10-CM | POA: Diagnosis not present

## 2023-12-06 DIAGNOSIS — M48061 Spinal stenosis, lumbar region without neurogenic claudication: Secondary | ICD-10-CM | POA: Diagnosis not present

## 2023-12-06 DIAGNOSIS — M7989 Other specified soft tissue disorders: Secondary | ICD-10-CM | POA: Diagnosis not present

## 2023-12-06 DIAGNOSIS — M069 Rheumatoid arthritis, unspecified: Secondary | ICD-10-CM | POA: Diagnosis not present

## 2023-12-06 DIAGNOSIS — M1991 Primary osteoarthritis, unspecified site: Secondary | ICD-10-CM | POA: Diagnosis not present

## 2023-12-06 DIAGNOSIS — B0230 Zoster ocular disease, unspecified: Secondary | ICD-10-CM | POA: Diagnosis not present

## 2023-12-16 ENCOUNTER — Other Ambulatory Visit: Payer: Self-pay | Admitting: Family Medicine

## 2023-12-28 ENCOUNTER — Ambulatory Visit (INDEPENDENT_AMBULATORY_CARE_PROVIDER_SITE_OTHER): Payer: Medicare HMO

## 2023-12-28 DIAGNOSIS — Z1231 Encounter for screening mammogram for malignant neoplasm of breast: Secondary | ICD-10-CM | POA: Diagnosis not present

## 2023-12-28 DIAGNOSIS — Z0000 Encounter for general adult medical examination without abnormal findings: Secondary | ICD-10-CM

## 2024-01-04 ENCOUNTER — Other Ambulatory Visit: Payer: Self-pay | Admitting: Family Medicine

## 2024-01-04 DIAGNOSIS — L400 Psoriasis vulgaris: Secondary | ICD-10-CM | POA: Diagnosis not present

## 2024-02-02 ENCOUNTER — Other Ambulatory Visit: Payer: Self-pay | Admitting: Family Medicine

## 2024-02-29 ENCOUNTER — Ambulatory Visit: Payer: Medicare HMO | Admitting: Sports Medicine

## 2024-03-07 DIAGNOSIS — M069 Rheumatoid arthritis, unspecified: Secondary | ICD-10-CM | POA: Diagnosis not present

## 2024-03-26 DIAGNOSIS — L400 Psoriasis vulgaris: Secondary | ICD-10-CM | POA: Diagnosis not present

## 2024-03-26 DIAGNOSIS — L299 Pruritus, unspecified: Secondary | ICD-10-CM | POA: Diagnosis not present

## 2024-04-02 ENCOUNTER — Other Ambulatory Visit: Payer: Self-pay | Admitting: Family Medicine

## 2024-04-02 DIAGNOSIS — I6523 Occlusion and stenosis of bilateral carotid arteries: Secondary | ICD-10-CM

## 2024-04-13 DIAGNOSIS — D3142 Benign neoplasm of left ciliary body: Secondary | ICD-10-CM | POA: Diagnosis not present

## 2024-04-18 DIAGNOSIS — H04123 Dry eye syndrome of bilateral lacrimal glands: Secondary | ICD-10-CM | POA: Diagnosis not present

## 2024-04-18 DIAGNOSIS — H52223 Regular astigmatism, bilateral: Secondary | ICD-10-CM | POA: Diagnosis not present

## 2024-04-18 DIAGNOSIS — D3142 Benign neoplasm of left ciliary body: Secondary | ICD-10-CM | POA: Diagnosis not present

## 2024-04-18 DIAGNOSIS — Z961 Presence of intraocular lens: Secondary | ICD-10-CM | POA: Diagnosis not present

## 2024-05-06 ENCOUNTER — Other Ambulatory Visit: Payer: Self-pay | Admitting: Family Medicine

## 2024-05-12 ENCOUNTER — Encounter: Payer: Self-pay | Admitting: *Deleted

## 2024-05-12 ENCOUNTER — Encounter (HOSPITAL_BASED_OUTPATIENT_CLINIC_OR_DEPARTMENT_OTHER): Payer: Self-pay | Admitting: Emergency Medicine

## 2024-05-12 ENCOUNTER — Ambulatory Visit: Admission: EM | Admit: 2024-05-12 | Discharge: 2024-05-12

## 2024-05-12 ENCOUNTER — Emergency Department (HOSPITAL_BASED_OUTPATIENT_CLINIC_OR_DEPARTMENT_OTHER)

## 2024-05-12 ENCOUNTER — Emergency Department (HOSPITAL_BASED_OUTPATIENT_CLINIC_OR_DEPARTMENT_OTHER)
Admission: EM | Admit: 2024-05-12 | Discharge: 2024-05-12 | Disposition: A | Discharge status: Home / Self Care | Attending: Emergency Medicine | Admitting: Emergency Medicine

## 2024-05-12 ENCOUNTER — Other Ambulatory Visit: Payer: Self-pay

## 2024-05-12 DIAGNOSIS — R0602 Shortness of breath: Secondary | ICD-10-CM | POA: Insufficient documentation

## 2024-05-12 DIAGNOSIS — R42 Dizziness and giddiness: Secondary | ICD-10-CM | POA: Diagnosis not present

## 2024-05-12 DIAGNOSIS — N183 Chronic kidney disease, stage 3 unspecified: Secondary | ICD-10-CM | POA: Insufficient documentation

## 2024-05-12 DIAGNOSIS — M7989 Other specified soft tissue disorders: Secondary | ICD-10-CM | POA: Diagnosis not present

## 2024-05-12 DIAGNOSIS — Z7982 Long term (current) use of aspirin: Secondary | ICD-10-CM | POA: Insufficient documentation

## 2024-05-12 DIAGNOSIS — Z91040 Latex allergy status: Secondary | ICD-10-CM | POA: Diagnosis not present

## 2024-05-12 DIAGNOSIS — R531 Weakness: Secondary | ICD-10-CM | POA: Diagnosis not present

## 2024-05-12 DIAGNOSIS — R059 Cough, unspecified: Secondary | ICD-10-CM | POA: Diagnosis not present

## 2024-05-12 DIAGNOSIS — I700 Atherosclerosis of aorta: Secondary | ICD-10-CM | POA: Diagnosis not present

## 2024-05-12 LAB — CBC WITH DIFFERENTIAL/PLATELET
Abs Immature Granulocytes: 0.01 K/uL (ref 0.00–0.07)
Basophils Absolute: 0.1 K/uL (ref 0.0–0.1)
Basophils Relative: 1 %
Eosinophils Absolute: 0.1 K/uL (ref 0.0–0.5)
Eosinophils Relative: 2 %
HCT: 36.8 % (ref 36.0–46.0)
Hemoglobin: 12.1 g/dL (ref 12.0–15.0)
Immature Granulocytes: 0 %
Lymphocytes Relative: 23 %
Lymphs Abs: 1.2 K/uL (ref 0.7–4.0)
MCH: 30.3 pg (ref 26.0–34.0)
MCHC: 32.9 g/dL (ref 30.0–36.0)
MCV: 92 fL (ref 80.0–100.0)
Monocytes Absolute: 0.6 K/uL (ref 0.1–1.0)
Monocytes Relative: 12 %
Neutro Abs: 3.2 K/uL (ref 1.7–7.7)
Neutrophils Relative %: 62 %
Platelets: 186 K/uL (ref 150–400)
RBC: 4 MIL/uL (ref 3.87–5.11)
RDW: 12.3 % (ref 11.5–15.5)
WBC: 5.1 K/uL (ref 4.0–10.5)
nRBC: 0 % (ref 0.0–0.2)

## 2024-05-12 LAB — COMPREHENSIVE METABOLIC PANEL WITH GFR
ALT: 27 U/L (ref 0–44)
AST: 36 U/L (ref 15–41)
Albumin: 4.5 g/dL (ref 3.5–5.0)
Alkaline Phosphatase: 52 U/L (ref 38–126)
Anion gap: 11 (ref 5–15)
BUN: 11 mg/dL (ref 8–23)
CO2: 25 mmol/L (ref 22–32)
Calcium: 9.7 mg/dL (ref 8.9–10.3)
Chloride: 104 mmol/L (ref 98–111)
Creatinine, Ser: 1.1 mg/dL — ABNORMAL HIGH (ref 0.44–1.00)
GFR, Estimated: 52 mL/min — ABNORMAL LOW (ref >60)
Glucose, Bld: 96 mg/dL (ref 70–99)
Potassium: 4.2 mmol/L (ref 3.5–5.1)
Sodium: 139 mmol/L (ref 135–145)
Total Bilirubin: 0.5 mg/dL (ref 0.0–1.2)
Total Protein: 6.5 g/dL (ref 6.5–8.1)

## 2024-05-12 LAB — URINALYSIS, ROUTINE W REFLEX MICROSCOPIC
Bilirubin Urine: NEGATIVE
Glucose, UA: NEGATIVE mg/dL
Hgb urine dipstick: NEGATIVE
Ketones, ur: NEGATIVE mg/dL
Leukocytes,Ua: NEGATIVE
Nitrite: NEGATIVE
Protein, ur: NEGATIVE mg/dL
Specific Gravity, Urine: 1.01 (ref 1.005–1.030)
pH: 7.5 (ref 5.0–8.0)

## 2024-05-12 LAB — TROPONIN T, HIGH SENSITIVITY: Troponin T High Sensitivity: <15 ng/L (ref <19)

## 2024-05-12 LAB — MAGNESIUM: Magnesium: 2.2 mg/dL (ref 1.7–2.4)

## 2024-05-12 NOTE — ED Provider Notes (Signed)
 [name]    CSN: [id number] Arrival date & time: 05/12/24  9182      History   Chief Complaint Chief Complaint  Patient presents with   Shortness of Breath    HPI Melody Moore is a 73 y.o. female.   HPI 73 year old female presents with shortness of breath, since yesterday and feels unsteady on her feet.  Patient reports history of adrenal gland crisis in 2018 and feels like similar symptoms PMH significant for carotid artery stenosis bilateral, CKD stage III, and rheumatoid arthritis involving multiple sites.  Patient is accompanied by her husband this morning.  Patient reports that she has been under immense amount of stress recently.   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   Rheumatoid arthritis (HCC)    Rheumatoid arthritis involving multiple sites (HCC) 05/26/2017   Transaminitis 06/15/2017   Normal abdominal US  06/22/17   Well adult exam 08/04/2023    Patient Active Problem List   Diagnosis Date Noted   Chest wall trauma 12/01/2023   Skin lesions 11/09/2023   Well adult exam 08/04/2023   Rash 08/04/2023   Thinning hair 05/20/2023   Vesicular rash 04/14/2023   Acute sinusitis 01/11/2023   Insomnia 01/11/2023   Primary osteoarthritis, 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   Right lumbar radiculitis 03/05/2022   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   Primary generalized (osteo)arthritis 07/03/2021   COVID-19 02/23/2021   Postherpetic neuralgia 05/21/2019   Normocytic anemia 02/22/2018   Encounter for monitoring statin therapy 02/22/2018   Dyslipidemia 02/22/2018   Primary osteoarthritis of right knee 12/16/2017   Benign hypertension with CKD (chronic kidney disease) stage III (HCC) 12/04/2017   History of echocardiogram 11/07/2017   Chronic kidney disease, stage 3a (HCC) 11/02/2017   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   Gallstones 06/22/2017   Elevated alkaline phosphatase level 06/15/2017   Transaminitis 06/15/2017   Acquired hypothyroidism 05/26/2017   Essential hypertension 05/26/2017   H/O total knee replacement, 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/2017   History of adrenal insufficiency 05/26/2017   Rheumatoid arthritis involving multiple sites (HCC) 05/26/2017   Allergic rhinitis 05/26/2017   Right carotid bruit 05/26/2017   Carotid stenosis, asymptomatic, bilateral 05/26/2017   Allergy to honey bee venom 05/26/2017    Past Surgical History:  Procedure Laterality Date   APPENDECTOMY     CATARACT EXTRACTION, BILATERAL  2015   JOINT REPLACEMENT  2018 2019   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   TOTAL KNEE ARTHROPLASTY Right 12/16/2017   Procedure: RIGHT TOTAL KNEE ARTHROPLASTY;  Surgeon: Yvone Rush, MD;  Location: [facility];  Service: Orthopedics;  Laterality: Right;  Adductor Block    OB History   No obstetric history on file.      Home Medications    Prior to Admission medications   Medication Sig Start Date End Date Taking? Authorizing Provider  acetaminophen  (TYLENOL ) 650 MG CR tablet Take 2 tablets (1,300 mg total) by mouth 2 (two) times daily as needed for pain. 02/22/18   Tommas Severa Norris, PA-C  amLODipine  (NORVASC ) 10 MG tablet TAKE 1 TABLET BY MOUTH EVERY DAY 01/04/24   Alvia Bring, DO  aspirin  EC 81 MG tablet Take 81 mg by mouth daily.    [provider]   atorvastatin  (LIPITOR) 40 MG tablet TAKE 1 TABLET BY MOUTH EVERY DAY 04/03/24   Alvia Bring, DO  Calcium  Carb-Cholecalciferol (CALCIUM  600 + D PO) Take 2 tablets by mouth daily.    [provider]  Cholecalciferol (VITAMIN D ) 2000 units tablet Take 2,000 Units by mouth daily.    [provider]  cycloSPORINE (RESTASIS) 0.05 % ophthalmic emulsion  10/14/23   [provider]  docusate sodium  (COLACE) 100 MG capsule Take 100 mg by mouth daily.    [provider]  EPINEPHRINE  0.3 mg/0.3 mL IJ SOAJ injection INJECT 0.3 MLS (0.3 MG TOTAL) INTO THE MUSCLE ONCE AS NEEDED FOR UP TO 1 DOSE. 06/04/21   Alvia Bring, DO  ferrous sulfate  325 (65 FE) MG tablet Take 325 mg by mouth daily with breakfast.    [provider]  fluticasone  (FLONASE ) 50 MCG/ACT nasal spray Place 2 sprays into both nostrils daily. SPRAY 2 SPRAYS INTO EACH NOSTRIL EVERY DAY 01/11/23   Alvia Bring, DO  folic acid  (FOLVITE ) 400 MCG tablet Take 1 tablet (400 mcg total) by mouth daily. 05/20/23   Curtis Debby PARAS, MD  gabapentin  (NEURONTIN ) 300 MG capsule TAKE 1 CAPSULE BY MOUTH EVERYDAY AT BEDTIME 12/19/23   Alvia Bring, DO  hydroxychloroquine  (PLAQUENIL ) 200 MG tablet Take 200 mg by mouth 2 (two) times daily.     [provider]  leflunomide (ARAVA) 20 MG tablet Take 1 tablet by mouth daily. 03/13/20   [provider]  levothyroxine  (SYNTHROID ) 50 MCG tablet TAKE 1 TABLET BY MOUTH EVERY DAY BEFORE BREAKFAST 09/08/23   Alvia Bring, DO  lisinopril  (ZESTRIL ) 20 MG tablet TAKE 1 TABLET BY MOUTH EVERY DAY 10/06/23   Alvia Bring, DO  predniSONE  (STERAPRED UNI-PAK 21 TAB) 10 MG (21) TBPK tablet Taper as directed on packaging. 11/09/23   Alvia Bring, DO  traMADol  (ULTRAM ) 50 MG tablet Take 1 tablet (50 mg total) by mouth every 12 (twelve) hours as needed. 12/03/22   Curtis Debby PARAS, MD  traZODone  (DESYREL ) 50 MG tablet TAKE 1 TO 2 TABLETS BY MOUTH AT BEDTIME AS NEEDED  05/07/24   Alvia Bring, DO  triamcinolone  ointment (KENALOG ) 0.5 % Apply 1 Application topically daily as needed. 11/09/23   Alvia Bring, DO  Turmeric 500 MG CAPS Take by mouth daily.    [provider]    Family History Family History  Problem Relation Age of Onset   Diabetes Mother    Arthritis Mother    Lung cancer Father    Hypertension Father    Uterine cancer Sister     Social History Social History   Tobacco Use   Smoking status: Former    Current packs/day: 0.00    Types: Cigarettes    Quit date: 02/07/1974    Years since quitting: 50.2   Smokeless tobacco: Never  Vaping Use   Vaping status: Never Used  Substance Use Topics   Alcohol use: Not Currently    Alcohol/week: 1.0 standard drink of alcohol    Types: 1 Standard drinks or equivalent per week    Comment: Once in a  while   Drug use: No     Allergies   Bee venom, Methotrexate derivatives, Oxycodone, Prednisone , Verapamil, Latex, Nickel, Nitrofuran derivatives, and Sulfasalazine   Review of Systems Review of Systems  Neurological:  Positive for dizziness and weakness.     Physical Exam Triage Vital Signs ED Triage Vitals  Encounter Vitals Group     BP      Girls Systolic BP Percentile      Girls Diastolic BP Percentile      Boys Systolic BP Percentile      Boys Diastolic BP Percentile      Pulse      Resp      Temp      Temp src      SpO2      Weight      Height      Head Circumference      Peak Flow      Pain Score      Pain Loc      Pain Education      Exclude from Growth Chart    No data found.  Updated Vital Signs BP (!) 163/78 (BP Location: Right Arm) Comment: checked twice, provider notified  Pulse 71   Temp 98.1 F (36.7 C) (Oral)   Resp (!) 22   Ht 5' 7 (1.702 m)   Wt 159 lb (72.1 kg)   SpO2 96%   BMI 24.90 kg/m    Physical Exam Vitals and nursing note reviewed.  Constitutional:      Appearance: Normal appearance. She is obese.  HENT:     Head:  Normocephalic and atraumatic.     Right Ear: Tympanic membrane, ear canal and external ear normal.     Left Ear: Tympanic membrane, ear canal and external ear normal.     Mouth/Throat:     Mouth: Mucous membranes are moist.     Pharynx: Oropharynx is clear.  Eyes:     Extraocular Movements: Extraocular movements intact.     Conjunctiva/sclera: Conjunctivae normal.     Pupils: Pupils are equal, round, and reactive to light.  Cardiovascular:     Rate and Rhythm: Normal rate and regular rhythm.     Pulses: Normal pulses.     Heart sounds: Normal heart sounds.  Pulmonary:     Effort: Pulmonary effort is normal.     Breath sounds: Normal breath sounds. No wheezing, rhonchi or rales.  Musculoskeletal:        General: Normal range of motion.     Cervical back: Normal range of motion and neck supple.  Skin:    General: Skin is warm and dry.  Neurological:     General: No focal deficit present.     Mental Status: She is alert and oriented to person, place, and time. Mental status is at baseline.     Cranial Nerves: No cranial nerve deficit.     Sensory: No sensory deficit.     Motor: No weakness.     Coordination: Coordination normal.     Gait: Gait normal.  Psychiatric:        Mood and Affect: Mood normal.        Behavior: Behavior normal.      UC Treatments / Results  Labs (all labs ordered are listed, but only abnormal results are displayed) Labs Reviewed - No data to display  EKG   Radiology No results found.  Procedures Procedures (including critical care time)  Medications Ordered in UC Medications -  No data to display  Initial Impression / Assessment and Plan / UC Course  I have reviewed the triage vital signs and the nursing notes.  Pertinent labs & imaging results that were available during my care of the patient were reviewed by me and considered in my medical decision making (see chart for details).     MDM: 1.  Weakness generalized-Advised  patient/husband go to [facility] ED now for further evaluation of weakness and dizziness.  Patient/husband agreed and verbalized understanding of these instructions and this plan of care today.  2.  Dizziness-Advised patient/husband go to [facility] ED now for further evaluation of weakness and dizziness.  Patient discharged to ED, hemodynamically stable. Final Clinical Impressions(s) / UC Diagnoses   Final diagnoses:  Weakness generalized  Dizziness     Discharge Instructions      Advised patient/husband go to [facility] ED now for further evaluation of weakness and dizziness.     ED Prescriptions   None    PDMP not reviewed this encounter.   Teddy Sharper, FNP 05/12/24 1000

## 2024-05-12 NOTE — ED Notes (Signed)
   05/12/24 1144  Resting  Supplemental oxygen during test? No  Resting Heart Rate 75  Resting Sp02 97  Lap 1 (250 feet)  HR 98  02 Sat 94   At lowest SpO2 93%, denies increased work of breathing. Vitals updated after ambulation.

## 2024-05-12 NOTE — ED Provider Notes (Signed)
 [place] EM[facility]GENCY DEPARTMENT AT [facility] Provider Note   CSN: [id number] Arrival date & time: 05/12/24  9067     Patient presents with: Shortness of Breath   Melody Moore is a 73 y.o. female.   Pt is a 73y/o female with hx of carotid artery stenosis bilateral, CKD stage III, and rheumatoid arthritis involving multiple sites, prior adrenal crisis who has not been on any type of steroids since 2023 who is presenting today with several vague complaints.  She reports that yesterday she just felt slightly confused and irritable.  She reports she would go downstairs to get something and then completely forget what she went downstairs to get.  She denied being confused about any memories that she has had for a long time and did not have any trouble speaking or walking.  She reports this morning she felt similar but when she woke up this morning she thought she slept well but felt completely exhausted when getting out of bed.  She attempted to take her dog on a walk with her husband and felt winded with a walk that she normally can do regularly but felt like she kept having to take deep breaths.  She denies any shortness of breath at rest.  No chest pain or tightness.  No wheezing or cough or nasal congestion.  She has not felt feverish or had any urinary symptoms.  No abdominal pain or vomiting.  She denies any diarrhea.  Does report a little bit of swelling in her legs that she felt was related to her psoriasis dermatitis that she has been seeking care with her dermatologist for.  Reports she has been eating and drinking regularly and has had no medication changes except for a new cream that she has been using for her psoriasis.  She was seen at the [facility] urgent care and was sent here for further evaluation.  She denies any prior heart history.  She does have stage III kidney disease which gets checked every 3 months and has been stable.  Her husband reports she has been very  stressed lately.  The history is provided by the patient, the spouse and medical records.  Shortness of Breath      Prior to Admission medications   Medication Sig Start Date End Date Taking? Authorizing Provider  acetaminophen  (TYLENOL ) 650 MG CR tablet Take 2 tablets (1,300 mg total) by mouth 2 (two) times daily as needed for pain. 02/22/18   Tommas Severa Norris, PA-C  amLODipine  (NORVASC ) 10 MG tablet TAKE 1 TABLET BY MOUTH EVERY DAY 01/04/24   Alvia Bring, DO  aspirin  EC 81 MG tablet Take 81 mg by mouth daily.    [provider]  atorvastatin  (LIPITOR) 40 MG tablet TAKE 1 TABLET BY MOUTH EVERY DAY 04/03/24   Alvia Bring, DO  Calcium  Carb-Cholecalciferol (CALCIUM  600 + D PO) Take 2 tablets by mouth daily.    [provider]  Cholecalciferol (VITAMIN D ) 2000 units tablet Take 2,000 Units by mouth daily.    [provider]  cycloSPORINE (RESTASIS) 0.05 % ophthalmic emulsion  10/14/23   [provider]  docusate sodium  (COLACE) 100 MG capsule Take 100 mg by mouth daily.    [provider]  EPINEPHRINE  0.3 mg/0.3 mL IJ SOAJ injection INJECT 0.3 MLS (0.3 MG TOTAL) INTO THE MUSCLE ONCE AS NEEDED FOR UP TO 1 DOSE. 06/04/21   Alvia Bring, DO  ferrous sulfate  325 (65 FE) MG tablet Take 325 mg by mouth  daily with breakfast.    [provider]  fluticasone  (FLONASE ) 50 MCG/ACT nasal spray Place 2 sprays into both nostrils daily. SPRAY 2 SPRAYS INTO EACH NOSTRIL EVERY DAY 01/11/23   Alvia Bring, DO  folic acid  (FOLVITE ) 400 MCG tablet Take 1 tablet (400 mcg total) by mouth daily. 05/20/23   Curtis Debby PARAS, MD  gabapentin  (NEURONTIN ) 300 MG capsule TAKE 1 CAPSULE BY MOUTH EVERYDAY AT BEDTIME 12/19/23   Alvia Bring, DO  hydroxychloroquine  (PLAQUENIL ) 200 MG tablet Take 200 mg by mouth 2 (two) times daily.     [provider]  leflunomide (ARAVA) 20 MG tablet Take 1 tablet by mouth daily. 03/13/20   [provider]   levothyroxine  (SYNTHROID ) 50 MCG tablet TAKE 1 TABLET BY MOUTH EVERY DAY BEFORE BREAKFAST 09/08/23   Alvia Bring, DO  lisinopril  (ZESTRIL ) 20 MG tablet TAKE 1 TABLET BY MOUTH EVERY DAY 10/06/23   Alvia Bring, DO  predniSONE  (STERAPRED UNI-PAK 21 TAB) 10 MG (21) TBPK tablet Taper as directed on packaging. 11/09/23   Alvia Bring, DO  traMADol  (ULTRAM ) 50 MG tablet Take 1 tablet (50 mg total) by mouth every 12 (twelve) hours as needed. 12/03/22   Curtis Debby PARAS, MD  traZODone  (DESYREL ) 50 MG tablet TAKE 1 TO 2 TABLETS BY MOUTH AT BEDTIME AS NEEDED 05/07/24   Alvia Bring, DO  triamcinolone  ointment (KENALOG ) 0.5 % Apply 1 Application topically daily as needed. 11/09/23   Alvia Bring, DO  Turmeric 500 MG CAPS Take by mouth daily.    [provider]    Allergies: Bee venom, Methotrexate derivatives, Oxycodone, Prednisone , Verapamil, Latex, Nickel, Nitrofuran derivatives, and Sulfasalazine    Review of Systems  Respiratory:  Positive for shortness of breath.     Updated Vital Signs BP (!) 183/78 Comment: after ambulating  Pulse 72 Comment: after ambulating  Temp 97.7 F (36.5 C) (Oral)   Resp 19   Ht 5' 7 (1.702 m)   Wt 72.1 kg   SpO2 94% Comment: after ambulating  BMI 24.90 kg/m   Physical Exam Vitals and nursing note reviewed.  Constitutional:      General: She is not in acute distress.    Appearance: She is well-developed.  HENT:     Head: Normocephalic and atraumatic.  Eyes:     Pupils: Pupils are equal, round, and reactive to light.  Cardiovascular:     Rate and Rhythm: Normal rate and regular rhythm.     Heart sounds: Normal heart sounds. No murmur heard.    No friction rub.  Pulmonary:     Effort: Pulmonary effort is normal. No tachypnea.     Breath sounds: Normal breath sounds. No decreased breath sounds, wheezing or rales.  Abdominal:     General: Bowel sounds are normal. There is no distension.     Palpations: Abdomen is soft.     Tenderness:  There is no abdominal tenderness. There is no guarding or rebound.  Musculoskeletal:        General: No tenderness. Normal range of motion.     Cervical back: Normal range of motion and neck supple.     Comments: No significant lower extremity edema.  Skin:    General: Skin is warm and dry.     Capillary Refill: Capillary refill takes less than 2 seconds.     Findings: No rash.  Neurological:     Mental Status: She is alert and oriented to person, place, and time. Mental status is at baseline.  Cranial Nerves: No cranial nerve deficit.     Sensory: No sensory deficit.     Motor: No weakness.     Coordination: Coordination normal.     Gait: Gait normal.  Psychiatric:        Behavior: Behavior normal.     (all labs ordered are listed, but only abnormal results are displayed) Labs Reviewed  COMPREHENSIVE METABOLIC PANEL WITH GFR - Abnormal; Notable for the following components:      Result Value   Creatinine, Ser 1.10 (*)    GFR, Estimated 52 (*)    All other components within normal limits  URINALYSIS, ROUTINE W REFLEX MICROSCOPIC - Abnormal; Notable for the following components:   Color, Urine STRAW (*)    All other components within normal limits  CBC WITH DIFFERENTIAL/PLATELET  MAGNESIUM   TROPONIN T, HIGH SENSITIVITY    EKG: EKG Interpretation Date/Time:  Saturday May 12 2024 09:46:47 EDT Ventricular Rate:  73 PR Interval:  132 QRS Duration:  94 QT Interval:  402 QTC Calculation: 443 R Axis:   103  Text Interpretation: Sinus rhythm Consider left atrial enlargement Right axis deviation No previous tracing Confirmed by Doretha Folks ([id number]) on 05/12/2024 9:49:26 AM  Radiology: [facility] Chest Port 1 View Result Date: 05/12/2024 CLINICAL DATA:  Shortness of breath, cough. EXAM: PORTABLE CHEST 1 VIEW COMPARISON:  12/01/2023. FINDINGS: Trachea is midline. Heart size within normal limits given AP technique. Thoracic aorta is calcified. Lungs are clear. IMPRESSION: No  acute findings. Electronically Signed   By: Newell Eke M.D.   On: 05/12/2024 10:51     Procedures   Medications Ordered in the ED - No data to display                                  Medical Decision Making Amount and/or Complexity of Data Reviewed Independent Historian: spouse External Data Reviewed: notes. Labs: ordered. Decision-making details documented in ED Course. Radiology: ordered and independent interpretation performed. Decision-making details documented in ED Course. ECG/medicine tests: ordered and independent interpretation performed. Decision-making details documented in ED Course.   Pt with multiple medical problems and comorbidities and presenting today with a complaint that caries a high risk for morbidity and mortality.  Here today with the above complaints.  Concern for electrolyte abnormalities, worsening renal function, anemia, nonspecific ACS, dysrhythmia, low suspicion at this time for stroke, infectious etiology, PE. Patient is well-appearing on exam and vital signs are reassuring except for hypertension which we will continue to follow.  I independently interpreted patient's labs and EKG.  EKG today shows a sinus rhythm without acute findings.  No evidence of dysrhythmia or ST findings concerning for ACS.  CBC, CMP, troponin all without acute findings, magnesium  is normal, UA without signs of infection.  I have independently visualized and interpreted pt's images today. Chest x-ray within normal limits.  Patient has been hypertensive here but in talking with the patient she has not taken her morning blood pressure medication.  She denies any headaches.  She was ambulated here with pulse ox denied feeling short of breath and pulse ox stayed greater than 94% on room air.  Discussed all the findings with the patient and her husband.  At this time patient appears stable for discharge and gave them return precautions.  They are comfortable with this plan.  No  indication for admission at this time.      Final diagnoses:  Shortness  of breath    ED Discharge Orders     None          Doretha Folks, MD 05/12/24 1204

## 2024-05-12 NOTE — ED Triage Notes (Addendum)
 Sob since yesterday and unsteady on her feet , has a small cough dry  pt ambulatory to triage and to room no cp but has left arm pain for a month

## 2024-05-12 NOTE — ED Triage Notes (Signed)
 Patient states shortness of breath since yesterday, feels unsteady on her feet.  Hx of adrenal gland crisis in 2018 and feels similar but also mentions recent stressors.  Tripped and fell this morning without injury

## 2024-05-12 NOTE — Discharge Instructions (Addendum)
 Advised patient/husband go to [facility] ED now for further evaluation of weakness and dizziness.

## 2024-05-12 NOTE — Discharge Instructions (Addendum)
 All the blood work, x-ray and EKG today look okay.  However if you start having any difficulty walking, the shortness of breath returns and gets worse, fever, vomiting, chest pain or abdominal pain or one-sided numbness weakness or difficulty speaking you should return to the emergency room.

## 2024-05-15 ENCOUNTER — Ambulatory Visit (INDEPENDENT_AMBULATORY_CARE_PROVIDER_SITE_OTHER): Admitting: Family Medicine

## 2024-05-15 VITALS — BP 134/71 | HR 72 | Resp 20 | Ht 67.0 in | Wt 161.0 lb

## 2024-05-15 DIAGNOSIS — F410 Panic disorder [episodic paroxysmal anxiety] without agoraphobia: Secondary | ICD-10-CM | POA: Diagnosis not present

## 2024-05-15 DIAGNOSIS — Z9189 Other specified personal risk factors, not elsewhere classified: Secondary | ICD-10-CM | POA: Insufficient documentation

## 2024-05-15 NOTE — Patient Instructions (Addendum)
 Let me know if want a referral for counseling or medication to help with panic attack
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
## 2024-05-15 NOTE — Assessment & Plan Note (Signed)
 Given paperwork and instructions for filing for domestic protection order.  Additional resources provided.  Discussed not hesitating to contact ball enforcement if needed

## 2024-05-15 NOTE — Assessment & Plan Note (Signed)
 Dyspnea likely related to panic attack from stressful event.  She feels better overall at this point.  We discussed short-term benzodiazepines use such as alprazolam if symptoms recur however she would like to hold off on this for now.  Discussed referral for counseling and therapy as well.  She will let me know if interested in this.

## 2024-06-05 ENCOUNTER — Encounter: Payer: Self-pay | Admitting: Sports Medicine

## 2024-06-07 DIAGNOSIS — M48061 Spinal stenosis, lumbar region without neurogenic claudication: Secondary | ICD-10-CM | POA: Diagnosis not present

## 2024-06-07 DIAGNOSIS — L409 Psoriasis, unspecified: Secondary | ICD-10-CM | POA: Diagnosis not present

## 2024-06-07 DIAGNOSIS — M353 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: Secondary | ICD-10-CM | POA: Diagnosis not present

## 2024-06-07 DIAGNOSIS — B0230 Zoster ocular disease, unspecified: Secondary | ICD-10-CM | POA: Diagnosis not present

## 2024-06-07 DIAGNOSIS — M8589 Other specified disorders of bone density and structure, multiple sites: Secondary | ICD-10-CM | POA: Diagnosis not present

## 2024-06-07 DIAGNOSIS — M069 Rheumatoid arthritis, unspecified: Secondary | ICD-10-CM | POA: Diagnosis not present

## 2024-06-07 DIAGNOSIS — N1831 Chronic kidney disease, stage 3a: Secondary | ICD-10-CM | POA: Diagnosis not present

## 2024-06-07 DIAGNOSIS — M1991 Primary osteoarthritis, unspecified site: Secondary | ICD-10-CM | POA: Diagnosis not present

## 2024-06-07 DIAGNOSIS — B0223 Postherpetic polyneuropathy: Secondary | ICD-10-CM | POA: Diagnosis not present

## 2024-06-13 ENCOUNTER — Other Ambulatory Visit: Payer: Self-pay | Admitting: Family Medicine

## 2024-06-29 ENCOUNTER — Telehealth: Payer: Self-pay

## 2024-06-29 NOTE — Telephone Encounter (Signed)
 Patient requesting office notes from 05/15/2024  be printed and placed at the front desk for pick up This has been printed and placed at front desk - patient informed.

## 2024-06-29 NOTE — Telephone Encounter (Signed)
 Copied from CRM #[id number]. Topic: General - Other >> Jun 29, 2024  9:24 AM Tonda B wrote: Reason for CRM: patient is calling in about office note from 08/12 patient has question please call patient [phone] (H)

## 2024-07-03 ENCOUNTER — Encounter: Payer: Self-pay | Admitting: Family Medicine

## 2024-07-03 ENCOUNTER — Ambulatory Visit: Admitting: Family Medicine

## 2024-07-03 VITALS — BP 135/70 | HR 68 | Ht 67.0 in | Wt 155.0 lb

## 2024-07-03 DIAGNOSIS — F411 Generalized anxiety disorder: Secondary | ICD-10-CM | POA: Insufficient documentation

## 2024-07-03 DIAGNOSIS — M48061 Spinal stenosis, lumbar region without neurogenic claudication: Secondary | ICD-10-CM | POA: Insufficient documentation

## 2024-07-03 MED ORDER — HYDROXYZINE PAMOATE 25 MG PO CAPS: 25.0000 mg | ORAL_CAPSULE | Freq: Three times a day (TID) | ORAL | 0 refills | Status: AC | PRN

## 2024-07-03 NOTE — Progress Notes (Signed)
 Melody Moore - 73 y.o. female MRN [id number]  Date of birth: [birth year]  Subjective Chief Complaint  Patient presents with   Anxiety    HPI Melody Moore is 73 y.o. female here today to discuss stress and anxiety.  She is having some marital issues that is causing more anxiety for her.  Anxiety feeling comes and goes.  Less ruminating thoughts now that she has spoken to an attorney and has a restraining order.  She is not currently taking SSRI for anxiety, only trazodone  for sleep.  She would be interested in trying PRN to use for severe anxiety.   ROS:  A comprehensive ROS was completed and negative except as noted per HPI  Allergies  Allergen Reactions   Bee Venom Anaphylaxis   Methotrexate Derivatives Other (See Comments)    Acute kidney injury   Oxycodone Other (See Comments)    Adrenal crisis while taking both prednisone  and oxycodone    Prednisone      Adrenal crisis while taking both prednisone  and oxycodone    Verapamil Hives   Latex Rash   Nickel Rash   Nitrofuran Derivatives Rash   Sulfasalazine Diarrhea and Nausea And Vomiting    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   Rheumatoid arthritis (HCC)    Rheumatoid arthritis involving multiple sites (HCC) 05/26/2017   Transaminitis 06/15/2017   Normal abdominal US  06/22/17   Well adult exam 08/04/2023    Past Surgical History:  Procedure  Laterality Date   APPENDECTOMY     CATARACT EXTRACTION, BILATERAL  2015   JOINT REPLACEMENT  2018 2019   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   TOTAL KNEE ARTHROPLASTY Right 12/16/2017   Procedure: RIGHT TOTAL KNEE ARTHROPLASTY;  Surgeon: Yvone Rush, MD;  Location: [facility];  Service: Orthopedics;  Laterality: Right;  Adductor Block    Social History   Socioeconomic History   Marital status: Married    Spouse name: Lonni Minerva   Number of children: 0   Years of education: 16   Highest education level: Associate degree: occupational, Scientist, product/process development, or vocational program  Occupational History   Occupation: [employer]    Comment: retired  Tobacco Use   Smoking status: Former    Current packs/day: 0.00    Types: Cigarettes    Quit date: 02/07/1974    Years since quitting: 50.4   Smokeless tobacco: Never  Vaping Use   Vaping status: Never Used  Substance and Sexual Activity   Alcohol use: Not Currently    Alcohol/week: 1.0 standard drink of alcohol    Types: 1 Standard drinks or equivalent per week    Comment: Once in a while   Drug use: No   Sexual activity: Yes    Partners: Male    Birth control/protection: Post-menopausal  Other Topics Concern   Not on file  Social History Narrative   06/27/2021 - recently  married in 2020 - originally from [place]       Lives with her husband. She enjoys walking and making wreaths.   Social Drivers of Health   Financial Resource Strain: Medium Risk (05/15/2024)   Overall Financial Resource Strain (CARDIA)    Difficulty of Paying Living Expenses: Somewhat hard  Food Insecurity: Food Insecurity Present (05/15/2024)   Hunger Vital Sign    Worried About Running Out of Food in the Last Year: Never true    Ran Out of Food in the Last Year: Sometimes true  Transportation Needs: No Transportation Needs (05/15/2024)   PRAPARE - Administrator, Civil Service (Medical): No    Lack of Transportation  (Non-Medical): No  Physical Activity: Sufficiently Active (05/15/2024)   Exercise Vital Sign    Days of Exercise per Week: 7 days    Minutes of Exercise per Session: 30 min  Stress: Stress Concern Present (05/15/2024)   [employer] of Occupational Health - Occupational Stress Questionnaire    Feeling of Stress: To some extent  Social Connections: Moderately Integrated (05/15/2024)   Social Connection and Isolation Panel    Frequency of Communication with Friends and Family: More than three times a week    Frequency of Social Gatherings with Friends and Family: Three times a week    Attends Religious Services: More than 4 times per year    Active Member of Clubs or Organizations: Yes    Attends Banker Meetings: 1 to 4 times per year    Marital Status: Separated    Family History  Problem Relation Age of Onset   Diabetes Mother    Arthritis Mother    Lung cancer Father    Hypertension Father    Uterine cancer Sister     Health Maintenance  Topic Date Due   DEXA SCAN  04/22/2024   Influenza Vaccine  05/04/2024   COVID-19 Vaccine (6 - 2025-26 season) 06/04/2024   Medicare Annual Wellness (AWV)  12/27/2024   DTaP/Tdap/Td (2 - Td or Tdap) 03/25/2027   Colonoscopy  04/04/2027   Pneumococcal Vaccine: 50+ Years  Completed   Hepatitis C Screening  Completed   Zoster Vaccines- Shingrix  Completed   HPV VACCINES  Aged Out   Meningococcal B Vaccine  Aged Out   Mammogram  Discontinued     ----------------------------------------------------------------------------------------------------------------------------------------------------------------------------------------------------------------- Physical Exam BP 135/70 (BP Location: Left Arm, Patient Position: Sitting, Cuff Size: Large)   Pulse 68   Ht 5' 7 (1.702 m)   Wt 155 lb (70.3 kg)   SpO2 97%   BMI 24.28 kg/m   Physical Exam Constitutional:      Appearance: Normal appearance.  Cardiovascular:      Rate and Rhythm: Normal rate and regular rhythm.  Pulmonary:     Effort: Pulmonary effort is normal.     Breath sounds: Normal breath sounds.  Neurological:     General: No focal deficit present.     Mental Status: She is alert.  Psychiatric:        Mood and Affect: Mood normal.        Behavior: Behavior normal.     ------------------------------------------------------------------------------------------------------------------------------------------------------------------------------------------------------------------- Assessment and Plan  GAD (generalized anxiety disorder) Starting vistaril as needed.  Follow up in 4-6 weeks.    Meds ordered this encounter  Medications   hydrOXYzine (VISTARIL) 25 MG capsule    Sig: Take 1 capsule (25 mg total) by mouth every 8 (eight) hours as needed for anxiety.    Dispense:  30 capsule    Refill:  0    Return in about 6 weeks (around 08/14/2024) for Mood/BH.

## 2024-07-03 NOTE — Assessment & Plan Note (Signed)
 Starting vistaril as needed.  Follow up in 4-6 weeks.

## 2024-07-04 ENCOUNTER — Other Ambulatory Visit: Payer: Self-pay | Admitting: Family Medicine

## 2024-08-14 ENCOUNTER — Encounter: Payer: Self-pay | Admitting: Family Medicine

## 2024-08-14 ENCOUNTER — Ambulatory Visit: Admitting: Family Medicine

## 2024-08-14 VITALS — BP 144/77 | HR 76 | Ht 67.0 in | Wt 156.0 lb

## 2024-08-14 DIAGNOSIS — L309 Dermatitis, unspecified: Secondary | ICD-10-CM | POA: Diagnosis not present

## 2024-08-14 DIAGNOSIS — I10 Essential (primary) hypertension: Secondary | ICD-10-CM

## 2024-08-14 DIAGNOSIS — F411 Generalized anxiety disorder: Secondary | ICD-10-CM

## 2024-08-14 MED ORDER — TRIAMCINOLONE ACETONIDE 0.5 % EX OINT: 1.0000 | TOPICAL_OINTMENT | Freq: Two times a day (BID) | CUTANEOUS | 1 refills | Status: DC | PRN

## 2024-08-14 NOTE — Progress Notes (Signed)
 Melody Moore - 72 y.o. female MRN [id number]  Date of birth: 12-18-1950  Subjective Chief Complaint  Patient presents with   Follow-up    Patient did state that she has recently separated from her pharmacy.     HPI Melody Moore is a 73 y.o. female here today for follow up visit.   She reports that she is doing ok.  Vistaril added prn at previous visit.  Tried this a couple of times but did not like how she felt while taking this.  Remains on trazodone  for insomnia.  She is going to [place], [place] to spend time with her sister for the holidays.  Overall she feels that mood is stable.   She would like renewal of triamcinolone  cream.   ROS:  A comprehensive ROS was completed and negative except as noted per HPI  Allergies  Allergen Reactions   Bee Venom Anaphylaxis   Methotrexate And Trimetrexate Other (See Comments)    Acute kidney injury   Oxycodone Other (See Comments)    Adrenal crisis while taking both prednisone  and oxycodone    Prednisone      Adrenal crisis while taking both prednisone  and oxycodone    Verapamil Hives   Latex Rash   Nickel Rash   Nitrofuran Derivatives Rash   Sulfasalazine Diarrhea and Nausea And Vomiting    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   Rheumatoid arthritis (HCC)    Rheumatoid arthritis involving multiple sites (HCC) 05/26/2017   Transaminitis 06/15/2017    Normal abdominal US  06/22/17   Well adult exam 08/04/2023    Past Surgical History:  Procedure Laterality Date   APPENDECTOMY     CATARACT EXTRACTION, BILATERAL  2015   JOINT REPLACEMENT  2018 2019   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   TOTAL KNEE ARTHROPLASTY Right 12/16/2017   Procedure: RIGHT TOTAL KNEE ARTHROPLASTY;  Surgeon: Yvone Rush, MD;  Location: [facility];  Service: Orthopedics;  Laterality: Right;  Adductor Block    Social History   Socioeconomic History   Marital status: Married    Spouse name: Lonni Minerva   Number of children: 0   Years of education: 16   Highest education level: Associate degree: academic program  Occupational History   Occupation: [employer]    Comment: retired  Tobacco Use   Smoking status: Former    Current packs/day: 0.00    Types: Cigarettes    Quit date: 02/07/1974    Years since quitting: 50.5   Smokeless tobacco: Never  Vaping Use   Vaping status: Never Used  Substance and Sexual Activity   Alcohol use: Not Currently    Alcohol/week: 1.0 standard drink of alcohol    Types: 1 Standard drinks or equivalent per week    Comment: Once in a while   Drug use: No   Sexual activity: Yes    Partners: Male  Birth control/protection: Post-menopausal  Other Topics Concern   Not on file  Social History Narrative   06/27/2021 - recently married in 2020 - originally from [place]       Lives with her husband. She enjoys walking and making wreaths.   Social Drivers of Health   Financial Resource Strain: Medium Risk (08/10/2024)   Overall Financial Resource Strain (CARDIA)    Difficulty of Paying Living Expenses: Somewhat hard  Food Insecurity: Food Insecurity Present (08/10/2024)   Hunger Vital Sign    Worried About Running Out of Food in the Last Year: Sometimes true    Ran Out of Food in the Last Year: Never true  Transportation Needs: No Transportation Needs (08/10/2024)   PRAPARE - Scientist, Research (physical Sciences) (Medical): No    Lack of Transportation (Non-Medical): No  Physical Activity: Sufficiently Active (08/10/2024)   Exercise Vital Sign    Days of Exercise per Week: 7 days    Minutes of Exercise per Session: 30 min  Stress: Stress Concern Present (08/10/2024)   [employer] of Occupational Health - Occupational Stress Questionnaire    Feeling of Stress: To some extent  Social Connections: Moderately Integrated (08/10/2024)   Social Connection and Isolation Panel    Frequency of Communication with Friends and Family: More than three times a week    Frequency of Social Gatherings with Friends and Family: Three times a week    Attends Religious Services: More than 4 times per year    Active Member of Clubs or Organizations: Yes    Attends Banker Meetings: More than 4 times per year    Marital Status: Separated    Family History  Problem Relation Age of Onset   Diabetes Mother    Arthritis Mother    Lung cancer Father    Hypertension Father    Uterine cancer Sister     Health Maintenance  Topic Date Due   DEXA SCAN  04/22/2024   Medicare Annual Wellness (AWV)  07/03/2024   COVID-19 Vaccine (6 - 2025-26 season) 08/24/2024 (Originally 06/04/2024)   Influenza Vaccine  01/01/2025 (Originally 05/04/2024)   DTaP/Tdap/Td (2 - Td or Tdap) 03/25/2027   Colonoscopy  04/04/2027   Pneumococcal Vaccine: 50+ Years  Completed   Hepatitis C Screening  Completed   Zoster Vaccines- Shingrix  Completed   Meningococcal B Vaccine  Aged Out   Mammogram  Discontinued     ----------------------------------------------------------------------------------------------------------------------------------------------------------------------------------------------------------------- Physical Exam BP (!) 144/77   Pulse 76   Ht 5' 7 (1.702 m)   Wt 156 lb (70.8 kg)   SpO2 99%   BMI 24.43 kg/m   Physical Exam Constitutional:      Appearance: Normal appearance.  Eyes:      General: No scleral icterus. Cardiovascular:     Rate and Rhythm: Normal rate and regular rhythm.  Pulmonary:     Effort: Pulmonary effort is normal.     Breath sounds: Normal breath sounds.  Neurological:     General: No focal deficit present.     Mental Status: She is alert.  Psychiatric:        Mood and Affect: Mood normal.        Behavior: Behavior normal.     ------------------------------------------------------------------------------------------------------------------------------------------------------------------------------------------------------------------- Assessment and Plan  Essential hypertension BP elevated this morning.  Has not taken medication yet today.    GAD (generalized anxiety disorder) Did not care for vistaril.  She may continue trazodone .  Will be spending time with her  sister for a few weeks which should be helpful.   Eczema Triamcinolone  renewed.   Meds ordered this encounter  Medications   triamcinolone  ointment (KENALOG ) 0.5 %    Sig: Apply 1 Application topically 2 (two) times daily as needed.    Dispense:  60 g    Refill:  1    Return in about 4 months (around 12/12/2024) for Hypertension, Mood/BH.

## 2024-08-14 NOTE — Assessment & Plan Note (Signed)
 Did not care for vistaril.  She may continue trazodone .  Will be spending time with her sister for a few weeks which should be helpful.

## 2024-08-14 NOTE — Assessment & Plan Note (Signed)
 BP elevated this morning.  Has not taken medication yet today.

## 2024-08-14 NOTE — Assessment & Plan Note (Signed)
 Triamcinolone renewed

## 2024-08-20 ENCOUNTER — Other Ambulatory Visit: Payer: Self-pay | Admitting: Family Medicine

## 2024-10-06 ENCOUNTER — Other Ambulatory Visit: Payer: Self-pay | Admitting: Family Medicine

## 2024-10-20 ENCOUNTER — Other Ambulatory Visit: Payer: Self-pay | Admitting: Family Medicine

## 2024-10-23 ENCOUNTER — Ambulatory Visit
Admission: EM | Admit: 2024-10-23 | Discharge: 2024-10-23 | Disposition: A | Discharge status: Home / Self Care | Attending: Family Medicine | Admitting: Family Medicine

## 2024-10-23 ENCOUNTER — Ambulatory Visit

## 2024-10-23 DIAGNOSIS — R0789 Other chest pain: Secondary | ICD-10-CM

## 2024-10-23 NOTE — Discharge Instructions (Signed)
 You were seen today for rib pain after a fall.  Your xray was read as normal, no fracture noted.  At this time I recommend you continue the tramadol  you have at home, and use heat/ice as needed.  If you have worsening pain or other symptoms then please return for re-evaluation.

## 2024-10-23 NOTE — ED Triage Notes (Signed)
 Pt states she fell down her stairs yesterday morning and is now having pain in her lower back and left side/ribs. Pt states she may have loss consciousness but doesn't know 100%. Pt also says she did not hit her head.

## 2024-10-23 NOTE — ED Provider Notes (Signed)
 " [facility]    CSN: [id number] Arrival date & time: 10/23/24  9180      History   Chief Complaint Chief Complaint  Patient presents with   Fall    HPI Melody Moore is a 74 y.o. female.    Fall  Patient is here for left sided rib pain after a fall.  Yesterday she was bringing laundry up the stairs and fell backward tumbling down the stairs.  She reports that she does not think she hit her head or have any LOC.  She had mild pain after the fall but today having worsening pain to the left lateral ribs/flank area.  She takes tramadol  for pain and took this with help.  No headaches, or dizziness at this time.  No neck pain.  NO chest pain or sob.  No n/v.        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   Rheumatoid arthritis (HCC)    Rheumatoid arthritis involving multiple sites (HCC) 05/26/2017   Transaminitis 06/15/2017   Normal abdominal US  06/22/17   Well adult exam 08/04/2023    Patient Active Problem List   Diagnosis Date Noted   Eczema 08/14/2024   Degenerative lumbar spinal stenosis 07/03/2024   GAD (generalized anxiety disorder) 07/03/2024   Panic attack 05/15/2024   At risk for domestic violence 05/15/2024   Chest wall trauma 12/01/2023   Skin lesions 11/09/2023   Well adult exam 08/04/2023   Rash 08/04/2023   Thinning hair 05/20/2023   Vesicular rash 04/14/2023    Acute sinusitis 01/11/2023   Insomnia 01/11/2023   Primary osteoarthritis, 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   Right lumbar radiculitis 03/05/2022   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 07/03/2021   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   Primary generalized (osteo)arthritis 07/03/2021   COVID-19 02/23/2021   Postherpetic neuralgia 05/21/2019   Normocytic anemia 02/22/2018   Encounter for monitoring statin therapy 02/22/2018   Dyslipidemia 02/22/2018   Primary osteoarthritis of right knee 12/16/2017   Benign hypertension with CKD (chronic kidney disease) stage III (HCC) 12/04/2017   History of echocardiogram 11/07/2017   Chronic kidney disease, stage 3a (HCC) 11/02/2017   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   Gallstones 06/22/2017   Elevated alkaline phosphatase level 06/15/2017   Transaminitis 06/15/2017   Acquired hypothyroidism 05/26/2017   Essential hypertension 05/26/2017   H/O total knee replacement, left 05/26/2017   History of adrenal insufficiency 05/26/2017   Rheumatoid arthritis involving multiple sites (HCC) 05/26/2017   Allergic rhinitis 05/26/2017   Right carotid bruit 05/26/2017   Carotid stenosis, asymptomatic, bilateral 05/26/2017   Allergy to honey bee venom 05/26/2017    Past Surgical History:  Procedure Laterality Date   APPENDECTOMY     CATARACT EXTRACTION, BILATERAL  2015   JOINT REPLACEMENT  2018 2019   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   TOTAL KNEE ARTHROPLASTY Right 12/16/2017   Procedure: RIGHT TOTAL  KNEE ARTHROPLASTY;  Surgeon: Yvone Rush, MD;  Location: [facility];  Service: Orthopedics;  Laterality: Right;  Adductor Block    OB History   No obstetric history on file.      Home Medications    Prior to Admission medications  Medication Sig Start Date End Date Taking? Authorizing Provider  amLODipine  (NORVASC ) 10 MG tablet TAKE 1 TABLET BY MOUTH EVERY DAY 07/04/24  Yes Melody Bring, DO  aspirin  EC 81 MG tablet Take 81 mg by mouth daily.   Yes  [provider]  atorvastatin  (LIPITOR) 40 MG tablet TAKE 1 TABLET BY MOUTH EVERY DAY 04/03/24  Yes Melody Bring, DO  Calcium  Carb-Cholecalciferol (CALCIUM  600 + D PO) Take 2 tablets by mouth daily.   Yes [provider]  Cholecalciferol (VITAMIN D ) 2000 units tablet Take 2,000 Units by mouth daily.   Yes [provider]  cycloSPORINE (RESTASIS) 0.05 % ophthalmic emulsion  10/14/23  Yes [provider]  docusate sodium  (COLACE) 100 MG capsule Take 100 mg by mouth daily.   Yes [provider]  ferrous sulfate  325 (65 FE) MG tablet Take 325 mg by mouth daily with breakfast.   Yes [provider]  fluticasone  (FLONASE ) 50 MCG/ACT nasal spray Place 2 sprays into both nostrils daily. SPRAY 2 SPRAYS INTO EACH NOSTRIL EVERY DAY 01/11/23  Yes Melody Bring, DO  folic acid  (FOLVITE ) 400 MCG tablet Take 1 tablet (400 mcg total) by mouth daily. 05/20/23  Yes Melody Debby PARAS, MD  gabapentin  (NEURONTIN ) 300 MG capsule TAKE 1 CAPSULE BY MOUTH EVERYDAY AT BEDTIME 06/15/24  Yes Melody Bring, DO  hydroxychloroquine  (PLAQUENIL ) 200 MG tablet Take 200 mg by mouth 2 (two) times daily.    Yes [provider]  leflunomide (ARAVA) 20 MG tablet Take 1 tablet by mouth daily. 03/13/20  Yes [provider]  levothyroxine  (SYNTHROID ) 50 MCG tablet TAKE 1 TABLET BY MOUTH EVERY DAY BEFORE BREAKFAST 07/04/24  Yes Melody Bring, DO  lisinopril  (ZESTRIL ) 20 MG tablet TAKE 1 TABLET BY MOUTH EVERY DAY 10/08/24  Yes Melody Bring, DO  traMADol  (ULTRAM ) 50 MG tablet Take 1 tablet (50 mg total) by mouth every 12 (twelve) hours as needed. 12/03/22  Yes Melody Debby PARAS, MD  traZODone  (DESYREL ) 50 MG tablet TAKE 1 TO 2 TABLETS BY MOUTH AT BEDTIME AS NEEDED 08/20/24  Yes Melody Bring, DO  Turmeric 500 MG CAPS Take by mouth daily.   Yes [provider]  acetaminophen  (TYLENOL ) 650 MG CR tablet Take 2 tablets (1,300 mg total) by mouth 2 (two) times  daily as needed for pain. 02/22/18   Melody Severa Norris, PA-C  EPINEPHRINE  0.3 mg/0.3 mL IJ SOAJ injection INJECT 0.3 MLS (0.3 MG TOTAL) INTO THE MUSCLE ONCE AS NEEDED FOR UP TO 1 DOSE. 06/04/21   Melody Bring, DO  hydrOXYzine  (VISTARIL ) 25 MG capsule Take 1 capsule (25 mg total) by mouth every 8 (eight) hours as needed for anxiety. 07/03/24   Melody Bring, DO  predniSONE  (STERAPRED UNI-PAK 21 TAB) 10 MG (21) TBPK tablet Taper as directed on packaging. 11/09/23   Melody Bring, DO  triamcinolone  ointment (KENALOG ) 0.5 % APPLY 1 APPLICATION TOPICALLY TWICE A DAY AS NEEDED 10/22/24   Melody Bring, DO    Family History Family History  Problem Relation Age of Onset   Diabetes Mother    Arthritis Mother    Lung cancer Father    Hypertension Father    Uterine cancer Sister     Social  History Social History[1]   Allergies   Bee venom, Methotrexate and trimetrexate, Oxycodone, Prednisone , Verapamil, Latex, Nickel, Nitrofuran derivatives, and Sulfasalazine   Review of Systems Review of Systems  Constitutional: Negative.   HENT: Negative.    Respiratory: Negative.    Cardiovascular: Negative.   Gastrointestinal: Negative.   Genitourinary: Negative.   Musculoskeletal:  Positive for arthralgias.  Neurological:  Negative for dizziness, weakness and light-headedness.     Physical Exam Triage Vital Signs ED Triage Vitals  Encounter Vitals Group     BP 10/23/24 0843 (!) 147/79     Girls Systolic BP Percentile --      Girls Diastolic BP Percentile --      Boys Systolic BP Percentile --      Boys Diastolic BP Percentile --      Pulse Rate 10/23/24 0843 79     Resp 10/23/24 0843 16     Temp 10/23/24 0843 97.7 F (36.5 C)     Temp Source 10/23/24 0843 Oral     SpO2 10/23/24 0843 96 %     Weight --      Height --      Head Circumference --      Peak Flow --      Pain Score 10/23/24 0840 8     Pain Loc --      Pain Education --      Exclude from Growth Chart --    No  data found.  Updated Vital Signs BP (!) 147/79 (BP Location: Right Arm)   Pulse 79   Temp 97.7 F (36.5 C) (Oral)   Resp 16   SpO2 96%   Visual Acuity Right Eye Distance:   Left Eye Distance:   Bilateral Distance:    Right Eye Near:   Left Eye Near:    Bilateral Near:     Physical Exam Constitutional:      Appearance: Normal appearance.  Cardiovascular:     Rate and Rhythm: Normal rate and regular rhythm.  Pulmonary:     Effort: Pulmonary effort is normal.     Breath sounds: Normal breath sounds.  Musculoskeletal:     Cervical back: Normal range of motion and neck supple. No tenderness.     Comments: No cervical spine or thoracic tenderness.  Mild lumbar tenderness;  she has mild TTP to the left posterior inferior ribs area.  No bruising or marks noted to the skin at this site.   Neurological:     General: No focal deficit present.     Mental Status: She is alert and oriented to person, place, and time.     Cranial Nerves: No cranial nerve deficit.     Motor: No weakness.  Psychiatric:        Mood and Affect: Mood normal.      UC Treatments / Results  Labs (all labs ordered are listed, but only abnormal results are displayed) Labs Reviewed - No data to display  EKG   Radiology DG Ribs Unilateral W/Chest Left Result Date: 10/23/2024 CLINICAL DATA:  Left rib pain after fall yesterday EXAM: LEFT RIBS AND CHEST - 3+ VIEW COMPARISON:  May 12, 2024 FINDINGS: No fracture or other bone lesions are seen involving the ribs. There is no evidence of pneumothorax or pleural effusion. Both lungs are clear. Heart size and mediastinal contours are within normal limits. IMPRESSION: Negative. Electronically Signed   By: Lynwood Landy Raddle M.D.   On: 10/23/2024 09:40    Procedures Procedures (  including critical care time)  Medications Ordered in UC Medications - No data to display  Initial Impression / Assessment and Plan / UC Course  I have reviewed the triage vital signs  and the nursing notes.  Pertinent labs & imaging results that were available during my care of the patient were reviewed by me and considered in my medical decision making (see chart for details).   Final Clinical Impressions(s) / UC Diagnoses   Final diagnoses:  Rib pain on left side     Discharge Instructions      You were seen today for rib pain after a fall.  Your xray was read as normal, no fracture noted.  At this time I recommend you continue the tramadol  you have at home, and use heat/ice as needed.  If you have worsening pain or other symptoms then please return for re-evaluation.     ED Prescriptions   None    PDMP not reviewed this encounter.    [1]  Social History Tobacco Use   Smoking status: Former    Current packs/day: 0.00    Types: Cigarettes    Quit date: 02/07/1974    Years since quitting: 50.7   Smokeless tobacco: Never  Vaping Use   Vaping status: Never Used  Substance Use Topics   Alcohol use: Not Currently    Alcohol/week: 1.0 standard drink of alcohol    Types: 1 Standard drinks or equivalent per week    Comment: Once in a while   Drug use: No     Darral Longs, MD 10/23/24 [phone]  "

## 2024-12-12 ENCOUNTER — Ambulatory Visit: Admitting: Family Medicine

## 2024-12-31 ENCOUNTER — Ambulatory Visit: Admitting: Family Medicine
